# Patient Record
Sex: Male | Born: 1955
Health system: Southern US, Community
[De-identification: ages and names within clinical notes are randomized; demographics above are authoritative.]

## PROBLEM LIST (undated history)

## (undated) DIAGNOSIS — F528 Other sexual dysfunction not due to a substance or known physiological condition: Secondary | ICD-10-CM

## (undated) DIAGNOSIS — J209 Acute bronchitis, unspecified: Secondary | ICD-10-CM

## (undated) DIAGNOSIS — E669 Obesity, unspecified: Secondary | ICD-10-CM

## (undated) DIAGNOSIS — E119 Type 2 diabetes mellitus without complications: Secondary | ICD-10-CM

## (undated) DIAGNOSIS — K219 Gastro-esophageal reflux disease without esophagitis: Secondary | ICD-10-CM

## (undated) DIAGNOSIS — F411 Generalized anxiety disorder: Secondary | ICD-10-CM

## (undated) DIAGNOSIS — M722 Plantar fascial fibromatosis: Secondary | ICD-10-CM

## (undated) DIAGNOSIS — K921 Melena: Secondary | ICD-10-CM

## (undated) DIAGNOSIS — Z550 Illiteracy and low-level literacy: Secondary | ICD-10-CM

## (undated) DIAGNOSIS — E785 Hyperlipidemia, unspecified: Secondary | ICD-10-CM

## (undated) DIAGNOSIS — M545 Low back pain: Secondary | ICD-10-CM

## (undated) DIAGNOSIS — I639 Cerebral infarction, unspecified: Secondary | ICD-10-CM

## (undated) DIAGNOSIS — I10 Essential (primary) hypertension: Secondary | ICD-10-CM

## (undated) HISTORY — DX: Gastro-esophageal reflux disease without esophagitis: K21.9

## (undated) HISTORY — DX: Hyperlipidemia, unspecified: E78.5

## (undated) HISTORY — DX: Other sexual dysfunction not due to a substance or known physiological condition: F52.8

## (undated) HISTORY — PX: APPENDECTOMY: SHX54

## (undated) HISTORY — DX: Melena: K92.1

## (undated) HISTORY — DX: Illiteracy and low-level literacy: Z55.0

## (undated) HISTORY — DX: Obesity, unspecified: E66.9

## (undated) HISTORY — DX: Plantar fascial fibromatosis: M72.2

## (undated) HISTORY — DX: Cerebral infarction, unspecified: I63.9

## (undated) HISTORY — DX: Acute bronchitis, unspecified: J20.9

## (undated) HISTORY — DX: Low back pain: M54.5

## (undated) HISTORY — DX: Type 2 diabetes mellitus without complications: E11.9

## (undated) HISTORY — DX: Essential (primary) hypertension: I10

## (undated) HISTORY — DX: Generalized anxiety disorder: F41.1

---

## 1999-02-01 ENCOUNTER — Emergency Department (HOSPITAL_COMMUNITY): Admission: EM | Admit: 1999-02-01 | Discharge: 1999-02-01 | Payer: Self-pay | Admitting: Emergency Medicine

## 1999-02-01 ENCOUNTER — Encounter: Payer: Self-pay | Admitting: Emergency Medicine

## 2001-07-12 ENCOUNTER — Emergency Department (HOSPITAL_COMMUNITY): Admission: EM | Admit: 2001-07-12 | Discharge: 2001-07-12 | Payer: Self-pay | Admitting: Emergency Medicine

## 2004-03-27 ENCOUNTER — Ambulatory Visit: Payer: Self-pay | Admitting: Internal Medicine

## 2004-05-08 ENCOUNTER — Ambulatory Visit: Payer: Self-pay | Admitting: Internal Medicine

## 2005-01-10 ENCOUNTER — Ambulatory Visit: Payer: Self-pay | Admitting: Internal Medicine

## 2005-02-09 ENCOUNTER — Ambulatory Visit: Payer: Self-pay

## 2005-02-26 ENCOUNTER — Ambulatory Visit: Payer: Self-pay | Admitting: Cardiology

## 2005-05-29 ENCOUNTER — Ambulatory Visit: Payer: Self-pay | Admitting: Cardiology

## 2006-06-19 ENCOUNTER — Ambulatory Visit: Payer: Self-pay | Admitting: Internal Medicine

## 2006-06-19 LAB — CONVERTED CEMR LAB
ALT: 40 units/L (ref 0–40)
AST: 26 units/L (ref 0–37)
Albumin: 4.1 g/dL (ref 3.5–5.2)
Alkaline Phosphatase: 76 units/L (ref 39–117)
BUN: 9 mg/dL (ref 6–23)
Basophils Absolute: 0.1 10*3/uL (ref 0.0–0.1)
Basophils Relative: 0.7 % (ref 0.0–1.0)
Bilirubin Urine: NEGATIVE
Bilirubin, Direct: 0.1 mg/dL (ref 0.0–0.3)
CO2: 29 meq/L (ref 19–32)
Calcium: 9.3 mg/dL (ref 8.4–10.5)
Chloride: 106 meq/L (ref 96–112)
Cholesterol: 210 mg/dL (ref 0–200)
Creatinine, Ser: 0.8 mg/dL (ref 0.4–1.5)
Direct LDL: 126.8 mg/dL
Eosinophils Absolute: 0.1 10*3/uL (ref 0.0–0.6)
Eosinophils Relative: 1.5 % (ref 0.0–5.0)
GFR calc Af Amer: 130 mL/min
GFR calc non Af Amer: 107 mL/min
Glucose, Bld: 278 mg/dL — ABNORMAL HIGH (ref 70–99)
HCT: 47.8 % (ref 39.0–52.0)
HDL: 24.2 mg/dL — ABNORMAL LOW (ref >39.0)
Hemoglobin: 16.2 g/dL (ref 13.0–17.0)
Hgb A1c MFr Bld: 10.9 % — ABNORMAL HIGH (ref 4.6–6.0)
Ketones, ur: NEGATIVE mg/dL
Leukocytes, UA: NEGATIVE
Lymphocytes Relative: 26.9 % (ref 12.0–46.0)
MCHC: 33.9 g/dL (ref 30.0–36.0)
MCV: 83.1 fL (ref 78.0–100.0)
Monocytes Absolute: 0.5 10*3/uL (ref 0.2–0.7)
Monocytes Relative: 7.3 % (ref 3.0–11.0)
Neutro Abs: 4.6 10*3/uL (ref 1.4–7.7)
Neutrophils Relative %: 63.6 % (ref 43.0–77.0)
Nitrite: NEGATIVE
PSA: 0.16 ng/mL
PSA: 0.16 ng/mL (ref 0.10–4.00)
Platelets: 216 10*3/uL (ref 150–400)
Potassium: 4.4 meq/L (ref 3.5–5.1)
RBC: 5.76 M/uL (ref 4.22–5.81)
RDW: 12.5 % (ref 11.5–14.6)
Sodium: 140 meq/L (ref 135–145)
Specific Gravity, Urine: >=1.03 (ref 1.000–1.03)
TSH: 1.73 microintl units/mL (ref 0.35–5.50)
Total Bilirubin: 0.7 mg/dL (ref 0.3–1.2)
Total CHOL/HDL Ratio: 8.7
Total Protein: 7.4 g/dL (ref 6.0–8.3)
Triglycerides: 236 mg/dL (ref 0–149)
Urine Glucose: 500 mg/dL — AB
Urobilinogen, UA: 1 (ref 0.0–1.0)
VLDL: 47 mg/dL — ABNORMAL HIGH (ref 0–40)
WBC: 7.2 10*3/uL (ref 4.5–10.5)
pH: 6 (ref 5.0–8.0)

## 2006-06-25 ENCOUNTER — Ambulatory Visit: Payer: Self-pay | Admitting: Internal Medicine

## 2006-09-20 ENCOUNTER — Ambulatory Visit: Payer: Self-pay | Admitting: Endocrinology

## 2006-09-25 ENCOUNTER — Ambulatory Visit: Payer: Self-pay | Admitting: Internal Medicine

## 2006-09-25 LAB — CONVERTED CEMR LAB
BUN: 10 mg/dL (ref 6–23)
CO2: 27 meq/L (ref 19–32)
Calcium: 9.1 mg/dL (ref 8.4–10.5)
Chloride: 109 meq/L (ref 96–112)
Cholesterol: 179 mg/dL (ref 0–200)
Creatinine, Ser: 0.7 mg/dL (ref 0.4–1.5)
Direct LDL: 141.7 mg/dL
GFR calc Af Amer: 152 mL/min
GFR calc non Af Amer: 125 mL/min
Glucose, Bld: 85 mg/dL (ref 70–99)
HDL: 21.8 mg/dL — ABNORMAL LOW (ref >39.0)
Hgb A1c MFr Bld: 6.4 % — ABNORMAL HIGH (ref 4.6–6.0)
Potassium: 4 meq/L (ref 3.5–5.1)
Sodium: 139 meq/L (ref 135–145)
Total CHOL/HDL Ratio: 8.2
Triglycerides: 215 mg/dL (ref 0–149)
VLDL: 43 mg/dL — ABNORMAL HIGH (ref 0–40)

## 2006-09-28 ENCOUNTER — Encounter: Payer: Self-pay | Admitting: Internal Medicine

## 2006-09-28 DIAGNOSIS — E119 Type 2 diabetes mellitus without complications: Secondary | ICD-10-CM

## 2006-09-28 DIAGNOSIS — M545 Low back pain, unspecified: Secondary | ICD-10-CM | POA: Insufficient documentation

## 2006-09-28 DIAGNOSIS — K219 Gastro-esophageal reflux disease without esophagitis: Secondary | ICD-10-CM | POA: Insufficient documentation

## 2006-09-28 DIAGNOSIS — I10 Essential (primary) hypertension: Secondary | ICD-10-CM | POA: Insufficient documentation

## 2006-09-28 DIAGNOSIS — E785 Hyperlipidemia, unspecified: Secondary | ICD-10-CM

## 2006-09-28 DIAGNOSIS — E669 Obesity, unspecified: Secondary | ICD-10-CM | POA: Insufficient documentation

## 2006-09-28 DIAGNOSIS — F528 Other sexual dysfunction not due to a substance or known physiological condition: Secondary | ICD-10-CM | POA: Insufficient documentation

## 2006-09-28 HISTORY — DX: Obesity, unspecified: E66.9

## 2006-09-28 HISTORY — DX: Low back pain, unspecified: M54.50

## 2006-09-28 HISTORY — DX: Hyperlipidemia, unspecified: E78.5

## 2006-09-28 HISTORY — DX: Essential (primary) hypertension: I10

## 2006-09-28 HISTORY — DX: Type 2 diabetes mellitus without complications: E11.9

## 2006-09-28 HISTORY — DX: Gastro-esophageal reflux disease without esophagitis: K21.9

## 2006-09-28 HISTORY — DX: Other sexual dysfunction not due to a substance or known physiological condition: F52.8

## 2007-07-04 ENCOUNTER — Ambulatory Visit: Payer: Self-pay | Admitting: Internal Medicine

## 2007-07-04 DIAGNOSIS — F411 Generalized anxiety disorder: Secondary | ICD-10-CM | POA: Insufficient documentation

## 2007-07-04 DIAGNOSIS — M722 Plantar fascial fibromatosis: Secondary | ICD-10-CM | POA: Insufficient documentation

## 2007-07-04 HISTORY — DX: Generalized anxiety disorder: F41.1

## 2007-07-04 HISTORY — DX: Plantar fascial fibromatosis: M72.2

## 2007-07-07 LAB — CONVERTED CEMR LAB
ALT: 32 units/L (ref 0–53)
AST: 27 units/L (ref 0–37)
Albumin: 4 g/dL (ref 3.5–5.2)
Alkaline Phosphatase: 54 units/L (ref 39–117)
BUN: 14 mg/dL (ref 6–23)
Basophils Absolute: 0.1 10*3/uL (ref 0.0–0.1)
Basophils Relative: 1.1 % — ABNORMAL HIGH (ref 0.0–1.0)
Bilirubin Urine: NEGATIVE
Bilirubin, Direct: 0.1 mg/dL (ref 0.0–0.3)
CO2: 26 meq/L (ref 19–32)
Calcium: 9.3 mg/dL (ref 8.4–10.5)
Chloride: 108 meq/L (ref 96–112)
Cholesterol: 207 mg/dL (ref 0–200)
Creatinine, Ser: 0.8 mg/dL (ref 0.4–1.5)
Creatinine,U: 125.6 mg/dL
Direct LDL: 161.1 mg/dL
Eosinophils Absolute: 0.1 10*3/uL (ref 0.0–0.7)
Eosinophils Relative: 1.7 % (ref 0.0–5.0)
GFR calc Af Amer: 130 mL/min
GFR calc non Af Amer: 107 mL/min
Glucose, Bld: 126 mg/dL — ABNORMAL HIGH (ref 70–99)
HCT: 45.6 % (ref 39.0–52.0)
HDL: 22.7 mg/dL — ABNORMAL LOW (ref >39.0)
Hemoglobin, Urine: NEGATIVE
Hemoglobin: 15.4 g/dL (ref 13.0–17.0)
Hgb A1c MFr Bld: 7.2 % — ABNORMAL HIGH (ref 4.6–6.0)
Ketones, ur: NEGATIVE mg/dL
Leukocytes, UA: NEGATIVE
Lymphocytes Relative: 30.8 % (ref 12.0–46.0)
MCHC: 33.7 g/dL (ref 30.0–36.0)
MCV: 83.9 fL (ref 78.0–100.0)
Microalb Creat Ratio: 8 mg/g (ref 0.0–30.0)
Microalb, Ur: 1 mg/dL (ref 0.0–1.9)
Monocytes Absolute: 0.6 10*3/uL (ref 0.1–1.0)
Monocytes Relative: 8.4 % (ref 3.0–12.0)
Neutro Abs: 4 10*3/uL (ref 1.4–7.7)
Neutrophils Relative %: 58 % (ref 43.0–77.0)
Nitrite: NEGATIVE
PSA: 0.21 ng/mL (ref 0.10–4.00)
Platelets: 229 10*3/uL (ref 150–400)
Potassium: 4.1 meq/L (ref 3.5–5.1)
RBC: 5.44 M/uL (ref 4.22–5.81)
RDW: 12.7 % (ref 11.5–14.6)
Sodium: 138 meq/L (ref 135–145)
Specific Gravity, Urine: 1.02 (ref 1.000–1.03)
TSH: 2.07 microintl units/mL (ref 0.35–5.50)
Total Bilirubin: 0.8 mg/dL (ref 0.3–1.2)
Total CHOL/HDL Ratio: 9.1
Total Protein, Urine: NEGATIVE mg/dL
Total Protein: 7.3 g/dL (ref 6.0–8.3)
Triglycerides: 119 mg/dL (ref 0–149)
Urine Glucose: NEGATIVE mg/dL
Urobilinogen, UA: 0.2 (ref 0.0–1.0)
VLDL: 24 mg/dL (ref 0–40)
WBC: 6.9 10*3/uL (ref 4.5–10.5)
pH: 5.5 (ref 5.0–8.0)

## 2007-10-30 ENCOUNTER — Encounter (INDEPENDENT_AMBULATORY_CARE_PROVIDER_SITE_OTHER): Payer: Self-pay | Admitting: *Deleted

## 2007-12-19 ENCOUNTER — Ambulatory Visit: Payer: Self-pay | Admitting: Internal Medicine

## 2007-12-19 DIAGNOSIS — J209 Acute bronchitis, unspecified: Secondary | ICD-10-CM | POA: Insufficient documentation

## 2007-12-19 HISTORY — DX: Acute bronchitis, unspecified: J20.9

## 2007-12-23 LAB — CONVERTED CEMR LAB
BUN: 10 mg/dL (ref 6–23)
CO2: 28 meq/L (ref 19–32)
Calcium: 8.8 mg/dL (ref 8.4–10.5)
Chloride: 107 meq/L (ref 96–112)
Cholesterol: 155 mg/dL (ref 0–200)
Creatinine, Ser: 0.8 mg/dL (ref 0.4–1.5)
GFR calc Af Amer: 130 mL/min
GFR calc non Af Amer: 107 mL/min
Glucose, Bld: 115 mg/dL — ABNORMAL HIGH (ref 70–99)
HDL: 20.7 mg/dL — ABNORMAL LOW (ref >39.0)
Hgb A1c MFr Bld: 7.3 % — ABNORMAL HIGH (ref 4.6–6.0)
LDL Cholesterol: 119 mg/dL — ABNORMAL HIGH (ref 0–99)
Potassium: 4.1 meq/L (ref 3.5–5.1)
Sodium: 140 meq/L (ref 135–145)
Total CHOL/HDL Ratio: 7.5
Triglycerides: 76 mg/dL (ref 0–149)
VLDL: 15 mg/dL (ref 0–40)

## 2008-10-04 ENCOUNTER — Telehealth: Payer: Self-pay | Admitting: Internal Medicine

## 2008-11-09 ENCOUNTER — Telehealth: Payer: Self-pay | Admitting: Internal Medicine

## 2008-11-12 ENCOUNTER — Ambulatory Visit: Payer: Self-pay | Admitting: Internal Medicine

## 2008-11-16 LAB — CONVERTED CEMR LAB
ALT: 29 units/L (ref 0–53)
AST: 30 units/L (ref 0–37)
Albumin: 4.2 g/dL (ref 3.5–5.2)
Alkaline Phosphatase: 48 units/L (ref 39–117)
BUN: 14 mg/dL (ref 6–23)
Basophils Absolute: 0 10*3/uL (ref 0.0–0.1)
Basophils Relative: 0.8 % (ref 0.0–3.0)
Bilirubin Urine: NEGATIVE
Bilirubin, Direct: 0.1 mg/dL (ref 0.0–0.3)
Calcium: 9.1 mg/dL (ref 8.4–10.5)
Chloride: 104 meq/L (ref 96–112)
Cholesterol: 187 mg/dL (ref 0–200)
Creatinine, Ser: 0.9 mg/dL (ref 0.4–1.5)
Creatinine,U: 140.1 mg/dL
Eosinophils Absolute: 0.1 10*3/uL (ref 0.0–0.7)
Eosinophils Relative: 1.4 % (ref 0.0–5.0)
GFR calc non Af Amer: 92.96 mL/min (ref >60)
Glucose, Bld: 250 mg/dL — ABNORMAL HIGH (ref 70–99)
HCT: 43.9 % (ref 39.0–52.0)
HDL: 26.1 mg/dL — ABNORMAL LOW (ref >39.00)
Hemoglobin, Urine: NEGATIVE
Hemoglobin: 15.4 g/dL (ref 13.0–17.0)
Hgb A1c MFr Bld: 6.2 % (ref 4.6–6.5)
Ketones, ur: NEGATIVE mg/dL
LDL Cholesterol: 141 mg/dL — ABNORMAL HIGH (ref 0–99)
Leukocytes, UA: NEGATIVE
Lymphocytes Relative: 27 % (ref 12.0–46.0)
Lymphs Abs: 1.6 10*3/uL (ref 0.7–4.0)
MCHC: 35 g/dL (ref 30.0–36.0)
MCV: 86.2 fL (ref 78.0–100.0)
Microalb Creat Ratio: 7.1 mg/g (ref 0.0–30.0)
Microalb, Ur: 1 mg/dL (ref 0.0–1.9)
Monocytes Absolute: 0.4 10*3/uL (ref 0.1–1.0)
Monocytes Relative: 5.8 % (ref 3.0–12.0)
Neutro Abs: 4 10*3/uL (ref 1.4–7.7)
Neutrophils Relative %: 65 % (ref 43.0–77.0)
Nitrite: NEGATIVE
PSA: 0.35 ng/mL (ref 0.10–4.00)
Platelets: 190 10*3/uL (ref 150.0–400.0)
Potassium: 4.4 meq/L (ref 3.5–5.1)
RBC: 5.09 M/uL (ref 4.22–5.81)
RDW: 13.2 % (ref 11.5–14.6)
Sodium: 136 meq/L (ref 135–145)
Specific Gravity, Urine: 1.015 (ref 1.000–1.030)
TSH: 1.35 microintl units/mL (ref 0.35–5.50)
Total Bilirubin: 0.7 mg/dL (ref 0.3–1.2)
Total CHOL/HDL Ratio: 7
Total Protein, Urine: NEGATIVE mg/dL
Total Protein: 6.6 g/dL (ref 6.0–8.3)
Triglycerides: 99 mg/dL (ref 0.0–149.0)
Urine Glucose: 500 mg/dL
Urobilinogen, UA: 1 (ref 0.0–1.0)
VLDL: 19.8 mg/dL (ref 0.0–40.0)
WBC: 6.1 10*3/uL (ref 4.5–10.5)
pH: 6.5 (ref 5.0–8.0)

## 2009-05-23 ENCOUNTER — Ambulatory Visit: Payer: Self-pay | Admitting: Internal Medicine

## 2009-05-23 LAB — CONVERTED CEMR LAB
BUN: 9 mg/dL (ref 6–23)
CO2: 28 meq/L (ref 19–32)
Calcium: 9.3 mg/dL (ref 8.4–10.5)
Chloride: 106 meq/L (ref 96–112)
Cholesterol, target level: 200 mg/dL
Cholesterol: 192 mg/dL (ref 0–200)
Creatinine, Ser: 0.8 mg/dL (ref 0.4–1.5)
GFR calc non Af Amer: 106.29 mL/min (ref >60)
Glucose, Bld: 131 mg/dL — ABNORMAL HIGH (ref 70–99)
HDL goal, serum: 40 mg/dL
HDL: 32.6 mg/dL — ABNORMAL LOW (ref >39.00)
Hgb A1c MFr Bld: 6.6 % — ABNORMAL HIGH (ref 4.6–6.5)
LDL Cholesterol: 126 mg/dL — ABNORMAL HIGH (ref 0–99)
LDL Goal: 100 mg/dL
Potassium: 4.3 meq/L (ref 3.5–5.1)
Sodium: 139 meq/L (ref 135–145)
Total CHOL/HDL Ratio: 6
Triglycerides: 167 mg/dL — ABNORMAL HIGH (ref 0.0–149.0)
VLDL: 33.4 mg/dL (ref 0.0–40.0)

## 2009-10-24 ENCOUNTER — Telehealth: Payer: Self-pay | Admitting: Internal Medicine

## 2009-11-16 ENCOUNTER — Ambulatory Visit: Payer: Self-pay | Admitting: Internal Medicine

## 2009-11-17 LAB — CONVERTED CEMR LAB
ALT: 27 units/L (ref 0–53)
AST: 24 units/L (ref 0–37)
Albumin: 4.1 g/dL (ref 3.5–5.2)
Alkaline Phosphatase: 58 units/L (ref 39–117)
BUN: 15 mg/dL (ref 6–23)
Basophils Absolute: 0.1 10*3/uL (ref 0.0–0.1)
Basophils Relative: 0.9 % (ref 0.0–3.0)
Bilirubin Urine: NEGATIVE
Bilirubin, Direct: 0.1 mg/dL (ref 0.0–0.3)
CO2: 28 meq/L (ref 19–32)
Calcium: 9.3 mg/dL (ref 8.4–10.5)
Chloride: 104 meq/L (ref 96–112)
Cholesterol: 204 mg/dL — ABNORMAL HIGH (ref 0–200)
Creatinine, Ser: 0.9 mg/dL (ref 0.4–1.5)
Creatinine,U: 67.8 mg/dL
Direct LDL: 149.1 mg/dL
Eosinophils Absolute: 0.1 10*3/uL (ref 0.0–0.7)
Eosinophils Relative: 1.8 % (ref 0.0–5.0)
GFR calc non Af Amer: 98.93 mL/min (ref >60)
Glucose, Bld: 98 mg/dL (ref 70–99)
HCT: 41.3 % (ref 39.0–52.0)
HDL: 29.7 mg/dL — ABNORMAL LOW (ref >39.00)
Hemoglobin, Urine: NEGATIVE
Hemoglobin: 14.5 g/dL (ref 13.0–17.0)
Hgb A1c MFr Bld: 7.1 % — ABNORMAL HIGH (ref 4.6–6.5)
Ketones, ur: NEGATIVE mg/dL
Lymphocytes Relative: 30.9 % (ref 12.0–46.0)
Lymphs Abs: 2.4 10*3/uL (ref 0.7–4.0)
MCHC: 35 g/dL (ref 30.0–36.0)
MCV: 83.3 fL (ref 78.0–100.0)
Microalb Creat Ratio: 0.9 mg/g (ref 0.0–30.0)
Microalb, Ur: 0.6 mg/dL (ref 0.0–1.9)
Monocytes Absolute: 0.6 10*3/uL (ref 0.1–1.0)
Monocytes Relative: 8.1 % (ref 3.0–12.0)
Neutro Abs: 4.5 10*3/uL (ref 1.4–7.7)
Neutrophils Relative %: 58.3 % (ref 43.0–77.0)
Nitrite: NEGATIVE
PSA: 0.24 ng/mL (ref 0.10–4.00)
Platelets: 225 10*3/uL (ref 150.0–400.0)
Potassium: 4 meq/L (ref 3.5–5.1)
RBC: 4.96 M/uL (ref 4.22–5.81)
RDW: 14.1 % (ref 11.5–14.6)
Sodium: 138 meq/L (ref 135–145)
Specific Gravity, Urine: 1.015 (ref 1.000–1.030)
TSH: 1.96 microintl units/mL (ref 0.35–5.50)
Total Bilirubin: 0.5 mg/dL (ref 0.3–1.2)
Total CHOL/HDL Ratio: 7
Total Protein, Urine: NEGATIVE mg/dL
Total Protein: 7.1 g/dL (ref 6.0–8.3)
Triglycerides: 244 mg/dL — ABNORMAL HIGH (ref 0.0–149.0)
Urine Glucose: NEGATIVE mg/dL
Urobilinogen, UA: 1 (ref 0.0–1.0)
VLDL: 48.8 mg/dL — ABNORMAL HIGH (ref 0.0–40.0)
WBC: 7.6 10*3/uL (ref 4.5–10.5)
pH: 6 (ref 5.0–8.0)

## 2009-11-18 ENCOUNTER — Ambulatory Visit: Payer: Self-pay | Admitting: Internal Medicine

## 2009-11-20 ENCOUNTER — Encounter: Payer: Self-pay | Admitting: Internal Medicine

## 2010-03-07 NOTE — Assessment & Plan Note (Signed)
Summary: 6 mos f/u #?cd   Vital Signs:  Patient profile:   55 year old male Height:      70.5 inches Weight:      237.50 pounds BMI:     33.72 O2 Sat:      94 % on Room air Temp:     98 degrees F oral Pulse rate:   80 / minute BP sitting:   130 / 78  (left arm) Cuff size:   large  Vitals Entered By: Zella Ball Ewing CMA Duncan Dull) (November 18, 2009 3:34 PM)  O2 Flow:  Room air  Preventive Care Screening     declines colonoscopy  CC: 6 month followup/RE   CC:  6 month followup/RE.  History of Present Illness: here for wellness and f/u- overall doing well,  Pt denies CP, worsening sob, doe, wheezing, orthopnea, pnd, worsening LE edema, palps, dizziness or syncope  Pt denies new neuro symptoms such as headache, facial or extremity weakness  Pt denies polydipsia, polyuria, or low sugar symptoms such as shakiness improved with eating.  Overall fair compliance with meds only - misses his sat AM meds most wks, "mostly" trying to follow low chol, DM diet, wt down - lost 7 lbs with better diet, little excercise however Currently not taking the statin - seemed confused whether he was supposed to take this    Problems Prior to Update: 1)  Asthmatic Bronchitis, Acute  (ICD-466.0) 2)  Anxiety  (ICD-300.00) 3)  Plantar Fasciitis, Bilateral  (ICD-728.71) 4)  Preventive Health Care  (ICD-V70.0) 5)  Obesity  (ICD-278.00) 6)  Erectile Dysfunction  (ICD-302.72) 7)  Low Back Pain  (ICD-724.2) 8)  Gerd  (ICD-530.81) 9)  Hypertension  (ICD-401.9) 10)  Hyperlipidemia  (ICD-272.4) 11)  Diabetes Mellitus, Type II  (ICD-250.00)  Medications Prior to Update: 1)  Cialis 20 Mg Tabs (Tadalafil) .Marland Kitchen.. 1po Once Daily As Needed 2)  Lotrisone 1-0.05 % Crea (Clotrimazole-Betamethasone) .... Apply To Skin Twice A Day 3)  Glucophage 500 Mg  Tabs (Metformin Hcl) .... 2 By Mouth in The Am, and 1 By Mouth in The Pm 4)  Glimepiride 4 Mg  Tabs (Glimepiride) .Marland Kitchen.. 1 By Mouth Two Times A Day 5)  Adult Aspirin Ec Low  Strength 81 Mg  Tbec (Aspirin) .Marland Kitchen.. 1 By Mouth Once Daily 6)  Diclofenac Sodium 75 Mg  Tbec (Diclofenac Sodium) .Marland Kitchen.. 1po Two Times A Day As Needed Pain 7)  Simvastatin 80 Mg Tabs (Simvastatin) .Marland Kitchen.. 1 By Mouth Once Daily  Current Medications (verified): 1)  Cialis 20 Mg Tabs (Tadalafil) .Marland Kitchen.. 1po Once Daily As Needed 2)  Lotrisone 1-0.05 % Crea (Clotrimazole-Betamethasone) .... Apply To Skin Twice A Day 3)  Glucophage 500 Mg  Tabs (Metformin Hcl) .... 2 By Mouth in The Am, and 1 By Mouth in The Pm 4)  Glimepiride 4 Mg  Tabs (Glimepiride) .Marland Kitchen.. 1 By Mouth Two Times A Day 5)  Adult Aspirin Ec Low Strength 81 Mg  Tbec (Aspirin) .Marland Kitchen.. 1 By Mouth Once Daily 6)  Diclofenac Sodium 75 Mg  Tbec (Diclofenac Sodium) .Marland Kitchen.. 1po Two Times A Day As Needed Pain 7)  Simvastatin 40 Mg Tabs (Simvastatin) .Marland Kitchen.. 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         all otherwise negative per pt -    Physical Exam  General:  alert and overweight-appearing.   Head:  normocephalic and atraumatic.   Eyes:  vision grossly intact, pupils equal, and pupils round.   Ears:  R ear normal and L ear normal.   Nose:  no external deformity and no nasal discharge.   Mouth:  no gingival abnormalities and pharynx pink and moist.   Neck:  supple and no masses.   Lungs:  normal respiratory effort and normal breath sounds.   Heart:  normal rate and regular rhythm.   Abdomen:  soft, non-tender, and normal bowel sounds.   Msk:  no joint tenderness and no joint swelling.   Extremities:  no edema, no erythema  Neurologic:  cranial nerves II-XII intact and strength normal in all extremities.  sensation intact to light touch and gait normal.   Skin:  no rashes.  color normal.   Psych:  not depressed appearing and slightly anxious.     Impression & Recommendations:  Problem # 1:  Preventive Health Care (ICD-V70.0) Overall doing well, age appropriate education and counseling updated, referral for preventive services and immunizations addressed, dietary counseling and smoking status adressed , most recent labs reviewed with pt, ecg declined per pt  I have personally reviewed and noted 1.   The patient's medical and social history 2.   Their use of alcohol, tobacco or illicit drugs 3.   Their current medications and supplements 4.   The patient's functional ability including ADL's, fall risks, home safety risks and hearing or visual             impairment. 5.   Diet and physical activities 6.   Evidence for depression or mood disorders The patients weight, height, BMI  have been recorded in the chart I have made referrals, counseling and provided education to the patient based review of the above   Problem # 2:  HYPERTENSION (ICD-401.9)  BP today: 130/78 Prior BP: 130/82 (05/23/2009)  Prior 10 Yr Risk Heart  Disease: 27 % (05/23/2009)  Labs Reviewed: K+: 4.0 (11/16/2009) Creat: : 0.9 (11/16/2009)   Chol: 204 (11/16/2009)   HDL: 29.70 (11/16/2009)   LDL: 126 (05/23/2009)   TG: 244.0 (11/16/2009) stable overall by hx and exam, ok to continue meds/tx as is - to cont wt control efforts  Problem # 3:  DIABETES MELLITUS, TYPE II (ICD-250.00)  His updated medication list for this problem includes:    Glucophage 500 Mg Tabs (Metformin hcl) .Marland Kitchen... 2 by mouth in the am, and 1 by mouth in the pm    Glimepiride 4 Mg Tabs (Glimepiride) .Marland Kitchen... 1 by mouth two times a day    Adult Aspirin Ec Low Strength 81 Mg Tbec (Aspirin) .Marland Kitchen... 1 by mouth once daily  Labs Reviewed: Creat: 0.9 (11/16/2009)    Reviewed HgBA1c results: 7.1 (11/16/2009)  6.6 (05/23/2009) stable  overall by hx and exam, ok to continue meds/tx as is , encourage improved med compliance  Problem # 4:  HYPERLIPIDEMIA (ICD-272.4)  His updated medication list for this problem includes:    Simvastatin 40 Mg Tabs (Simvastatin) .Marland Kitchen... 1po once daily  Labs Reviewed: SGOT: 24 (11/16/2009)   SGPT: 27 (11/16/2009)  Lipid Goals: Chol Goal: 200 (05/23/2009)   HDL Goal: 40 (05/23/2009)   LDL Goal: 100 (05/23/2009)   TG Goal: 150 (05/23/2009)  Prior 10 Yr Risk Heart Disease: 27 % (05/23/2009)   HDL:29.70 (11/16/2009), 32.60 (05/23/2009)  LDL:126 (05/23/2009), 141 (11/12/2008)  Chol:204 (11/16/2009), 192 (05/23/2009)  Trig:244.0 (11/16/2009), 167.0 (05/23/2009) to re-start at 40 mg - to take all new medications as prescribed . Pt to continue diet efforts, - goal LDL less than 70   Complete Medication List: 1)  Cialis 20 Mg Tabs (Tadalafil) .Marland Kitchen.. 1po once daily as needed 2)  Lotrisone 1-0.05 % Crea (Clotrimazole-betamethasone) .... Apply to skin twice a day 3)  Glucophage 500 Mg Tabs (Metformin hcl) .... 2 by mouth in the am, and 1 by mouth in the pm 4)  Glimepiride 4 Mg Tabs (Glimepiride) .Marland Kitchen.. 1 by mouth two times a day 5)  Adult Aspirin Ec Low Strength  81 Mg Tbec (Aspirin) .Marland Kitchen.. 1 by mouth once daily 6)  Diclofenac Sodium 75 Mg Tbec (Diclofenac sodium) .Marland Kitchen.. 1po two times a day as needed pain 7)  Simvastatin 40 Mg Tabs (Simvastatin) .Marland Kitchen.. 1po once daily  Patient Instructions: 1)  Please take all new medications as prescribed  - the cholesterol pill 2)  Continue all previous medications as before this visit 3)  Please schedule a follow-up appointment in 6 months with: 4)  BMP prior to visit, ICD-9: 250.02 5)  Lipid Panel prior to visit, ICD-9: 6)  HbgA1C prior to visit, ICD-9: Prescriptions: SIMVASTATIN 40 MG TABS (SIMVASTATIN) 1po once daily  #90 x 3   Entered and Authorized by:   Corwin Levins MD   Signed by:   Corwin Levins MD on 11/18/2009   Method used:   Electronically to        [facility] [phone]* (retail)       [address]       [place], [place]  [address]       Ph: [phone]       Fax: [phone]   RxID:   [id number] LOTRISONE 1-0.05 % CREA (CLOTRIMAZOLE-BETAMETHASONE) Apply to skin twice a day  #1 x 1   Entered and Authorized by:   Corwin Levins MD   Signed by:   Corwin Levins MD on 11/18/2009   Method used:   Electronically to        [facility] [phone]* (retail)       [address]       [place], [place]  [address]       Ph: [phone]       Fax: [phone]   RxID:   [id number]

## 2010-03-07 NOTE — Miscellaneous (Signed)
Summary: Orders Update   Clinical Lists Changes  Orders: Added new Service order of Est. Patient 40-64 years (99396) - Signed 

## 2010-03-07 NOTE — Assessment & Plan Note (Signed)
Summary: 6 mos f/u #/cd   Vital Signs:  Patient profile:   55 year old male Height:      70 inches Weight:      246.50 pounds BMI:     35.50 O2 Sat:      94 % on Room air Temp:     97.2 degrees F oral Pulse rate:   75 / minute BP sitting:   130 / 82  (left arm) Cuff size:   large  Vitals Entered ByZella Ball Ewing (May 23, 2009 9:46 AM)  O2 Flow:  Room air  Preventive Care Screening     declines pneumonia   CC: 6 Month Followup/RE, Lipid Management   CC:  6 Month Followup/RE and Lipid Management.  History of Present Illness: overall doing well,  Pt denies CP, sob, doe, wheezing, orthopnea, pnd, worsening LE edema, palps, dizziness or syncope  Pt denies new neuro symptoms such as headache, facial or extremity weakness  Pt denies polydipsia, polyuria, or low sugar symptoms such as shakiness improved with eating.  Overall good compliance with meds, trying to follow low chol, DM diet, wt stable, little excercise however  CBGs run in the lower 100's, only forgets meds about 1 time per wk.    Lipid Management History:      Positive NCEP/ATP III risk factors include male age 75 years old or older, diabetes, HDL cholesterol less than 40, and hypertension.  Negative NCEP/ATP III risk factors include non-tobacco-user status.    Preventive Screening-Counseling & Management      Drug Use:  no.    Problems Prior to Update: 1)  Asthmatic Bronchitis, Acute  (ICD-466.0) 2)  Anxiety  (ICD-300.00) 3)  Plantar Fasciitis, Bilateral  (ICD-728.71) 4)  Preventive Health Care  (ICD-V70.0) 5)  Obesity  (ICD-278.00) 6)  Erectile Dysfunction  (ICD-302.72) 7)  Low Back Pain  (ICD-724.2) 8)  Gerd  (ICD-530.81) 9)  Hypertension  (ICD-401.9) 10)  Hyperlipidemia  (ICD-272.4) 11)  Diabetes Mellitus, Type II  (ICD-250.00)  Medications Prior to Update: 1)  Cialis 20 Mg Tabs (Tadalafil) .Marland Kitchen.. 1po Once Daily As Needed 2)  Lotrisone 1-0.05 % Crea (Clotrimazole-Betamethasone) .... Apply To Skin Twice A  Day 3)  Glucophage 500 Mg  Tabs (Metformin Hcl) .... 2 By Mouth in The Am, and 1 By Mouth in The Pm 4)  Glimepiride 4 Mg  Tabs (Glimepiride) .Marland Kitchen.. 1 By Mouth Two Times A Day 5)  Adult Aspirin Ec Low Strength 81 Mg  Tbec (Aspirin) .Marland Kitchen.. 1 By Mouth Once Daily 6)  Diclofenac Sodium 75 Mg  Tbec (Diclofenac Sodium) .Marland Kitchen.. 1po Two Times A Day As Needed Pain 7)  Simvastatin 80 Mg Tabs (Simvastatin) .Marland Kitchen.. 1 By Mouth Once Daily  Current Medications (verified): 1)  Cialis 20 Mg Tabs (Tadalafil) .Marland Kitchen.. 1po Once Daily As Needed 2)  Lotrisone 1-0.05 % Crea (Clotrimazole-Betamethasone) .... Apply To Skin Twice A Day 3)  Glucophage 500 Mg  Tabs (Metformin Hcl) .... 2 By Mouth in The Am, and 1 By Mouth in The Pm 4)  Glimepiride 4 Mg  Tabs (Glimepiride) .Marland Kitchen.. 1 By Mouth Two Times A Day 5)  Adult Aspirin Ec Low Strength 81 Mg  Tbec (Aspirin) .Marland Kitchen.. 1 By Mouth Once Daily 6)  Diclofenac Sodium 75 Mg  Tbec (Diclofenac Sodium) .Marland Kitchen.. 1po Two Times A Day As Needed Pain 7)  Simvastatin 80 Mg Tabs (Simvastatin) .Marland Kitchen.. 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   Head:  normocephalic and atraumatic.   Eyes:  vision grossly intact, pupils equal, and  pupils round.   Ears:  R ear normal and L ear normal.   Nose:  no external deformity and no nasal discharge.   Mouth:  no gingival abnormalities and pharynx pink and moist.   Neck:  supple and no masses.   Lungs:  normal respiratory effort and normal breath sounds.   Heart:  normal rate and regular rhythm.   Extremities:  no edema, no erythema    Impression & Recommendations:  Problem # 1:  HYPERTENSION (ICD-401.9)  BP today: 130/82 Prior BP: 132/84 (11/12/2008)  Labs Reviewed: K+: 4.4 (11/12/2008) Creat: : 0.9 (11/12/2008)   Chol: 187 (11/12/2008)   HDL: 26.10 (11/12/2008)   LDL: 141 (11/12/2008)   TG: 99.0 (11/12/2008) stable overall by hx and exam, ok to continue meds/tx as is  - no meds needed at this time, to which he attributes to regular marijuana use  Problem # 2:  DIABETES MELLITUS, TYPE II (ICD-250.00)  His updated medication list for this problem includes:    Glucophage 500 Mg Tabs (Metformin hcl) .Marland Kitchen... 2 by mouth in the am, and 1 by mouth in the pm    Glimepiride 4 Mg Tabs (Glimepiride) .Marland Kitchen... 1 by mouth two times a day    Adult Aspirin Ec Low Strength 81 Mg Tbec (Aspirin) .Marland Kitchen... 1 by mouth once daily  Labs Reviewed: Creat: 0.9 (11/12/2008)    Reviewed HgBA1c results: 6.2 (11/12/2008)  7.3 (12/19/2007) stable overall by hx and exam, ok to continue meds/tx as is   Orders: TLB-BMP (Basic Metabolic Panel-BMET) (80048-METABOL) TLB-Lipid Panel (80061-LIPID) TLB-A1C / Hgb A1C (Glycohemoglobin) (83036-A1C)  Problem # 3:  HYPERLIPIDEMIA (ICD-272.4)  His updated medication list for this problem includes:    Simvastatin 80 Mg Tabs (Simvastatin) .Marland Kitchen... 1 by mouth once daily  Labs Reviewed: SGOT: 30 (11/12/2008)   SGPT: 29 (11/12/2008)   HDL:26.10 (11/12/2008), 20.7 (12/19/2007)  LDL:141 (11/12/2008), 119 (12/19/2007)  Chol:187 (11/12/2008), 155 (12/19/2007)  Trig:99.0 (11/12/2008), 76 (12/19/2007) stable overall by hx and exam, ok to continue meds/tx as is   Complete  Medication List: 1)  Cialis 20 Mg Tabs (Tadalafil) .Marland Kitchen.. 1po once daily as needed 2)  Lotrisone 1-0.05 % Crea (Clotrimazole-betamethasone) .... Apply to skin twice a day 3)  Glucophage 500 Mg Tabs (Metformin hcl) .... 2 by mouth in the am, and 1 by mouth in the pm 4)  Glimepiride 4 Mg Tabs (Glimepiride) .Marland Kitchen.. 1 by mouth two times a day 5)  Adult Aspirin Ec Low Strength 81 Mg Tbec (Aspirin) .Marland Kitchen.. 1 by mouth once daily 6)  Diclofenac Sodium 75 Mg Tbec (Diclofenac sodium) .Marland Kitchen.. 1po two times a day as needed pain 7)  Simvastatin 80 Mg Tabs (Simvastatin) .Marland Kitchen.. 1 by mouth once daily  Lipid Assessment/Plan:      Based  on NCEP/ATP III, the patient's risk factor category is "history of diabetes".  The patient's lipid goals are as follows: Total cholesterol goal is 200; LDL cholesterol goal is 100; HDL cholesterol goal is 40; Triglyceride goal is 150.     Patient Instructions: 1)  Continue all previous medications as before this visit  2)  Please go to the Lab in the basement for your blood and/or urine tests today 3)  Please schedule a follow-up appointment in 6 months with CPX labs and: 4)  HbgA1C prior to visit, ICD-9: 250.02 5)  Urine Microalbumin prior to visit, ICD-9:

## 2010-03-07 NOTE — Progress Notes (Signed)
  Phone Note Refill Request Message from:  Fax from Pharmacy on October 24, 2009 8:43 AM  Refills Requested: Medication #1:  GLUCOPHAGE 500 MG  TABS 2 by mouth in the AM   Dosage confirmed as above?Dosage Confirmed   Last Refilled: 03/15/2009   Notes: [facility], Initial call taken by: Zella Ball Ewing CMA Duncan Dull),  October 24, 2009 8:43 AM    Prescriptions: GLUCOPHAGE 500 MG  TABS (METFORMIN HCL) 2 by mouth in the AM, and 1 by mouth in the PM  #270 Each x 3   Entered by:   Zella Ball Ewing CMA (AAMA)   Authorized by:   Corwin Levins MD   Signed by:   Scharlene Gloss CMA (AAMA) on 10/24/2009   Method used:   Faxed to ...       [facility]  [address]  [phone]* (retail)       [address]       [place], [place]  [address]       Ph: [phone] or [phone]       Fax: [phone]   RxID:   [id number]

## 2010-05-19 ENCOUNTER — Other Ambulatory Visit: Payer: Self-pay | Admitting: Internal Medicine

## 2010-05-19 ENCOUNTER — Other Ambulatory Visit: Payer: Self-pay

## 2010-05-19 DIAGNOSIS — IMO0001 Reserved for inherently not codable concepts without codable children: Secondary | ICD-10-CM

## 2010-05-26 ENCOUNTER — Ambulatory Visit (INDEPENDENT_AMBULATORY_CARE_PROVIDER_SITE_OTHER): Payer: BC Managed Care – PPO | Admitting: Internal Medicine

## 2010-05-26 ENCOUNTER — Encounter: Payer: Self-pay | Admitting: Internal Medicine

## 2010-05-26 ENCOUNTER — Other Ambulatory Visit (INDEPENDENT_AMBULATORY_CARE_PROVIDER_SITE_OTHER): Payer: BC Managed Care – PPO

## 2010-05-26 VITALS — BP 120/78 | HR 79 | Temp 97.8°F | Ht 70.0 in | Wt 245.0 lb

## 2010-05-26 DIAGNOSIS — I10 Essential (primary) hypertension: Secondary | ICD-10-CM

## 2010-05-26 DIAGNOSIS — F528 Other sexual dysfunction not due to a substance or known physiological condition: Secondary | ICD-10-CM

## 2010-05-26 DIAGNOSIS — E785 Hyperlipidemia, unspecified: Secondary | ICD-10-CM

## 2010-05-26 DIAGNOSIS — E119 Type 2 diabetes mellitus without complications: Secondary | ICD-10-CM

## 2010-05-26 DIAGNOSIS — Z719 Counseling, unspecified: Secondary | ICD-10-CM | POA: Insufficient documentation

## 2010-05-26 DIAGNOSIS — Z0000 Encounter for general adult medical examination without abnormal findings: Secondary | ICD-10-CM

## 2010-05-26 LAB — HEMOGLOBIN A1C: Hgb A1c MFr Bld: 8.9 % — ABNORMAL HIGH (ref 4.6–6.5)

## 2010-05-26 MED ORDER — TADALAFIL 20 MG PO TABS: 20.0000 mg | ORAL_TABLET | Freq: Every day | ORAL | Status: DC | PRN

## 2010-05-26 MED ORDER — LOVASTATIN 40 MG PO TABS: 40.0000 mg | ORAL_TABLET | Freq: Every day | ORAL | Status: DC

## 2010-05-26 NOTE — Patient Instructions (Signed)
Take all new medications as prescribed - the cialis (you are given the coupon today) Start the lovastatin at 40 mg per day Continue all other medications as before Please go to LAB in the Basement for the blood and/or urine tests to be done today Please call the number on the Blue Card (the [employer] System) for results of testing in 2-3 days Please return in 6 mo with Lab testing done 3-5 days before

## 2010-05-26 NOTE — Assessment & Plan Note (Addendum)
Per pt, crestor too expensive, lipitor too risky after heard friends talking about it, also heard bad things about the zocor so never tried it after last visit, but is willing to try the lovastatin 40  - to start, with f/u labs next visit  Lab Results  Component Value Date   LDLCALC 126* 05/23/2009

## 2010-05-27 ENCOUNTER — Encounter: Payer: Self-pay | Admitting: Internal Medicine

## 2010-05-27 MED ORDER — DICLOFENAC SODIUM 75 MG PO TBEC: 75.0000 mg | DELAYED_RELEASE_TABLET | Freq: Two times a day (BID) | ORAL | Status: DC

## 2010-05-27 MED ORDER — METFORMIN HCL 500 MG PO TABS: ORAL_TABLET | ORAL | Status: DC

## 2010-05-27 MED ORDER — GLUCOSE BLOOD VI STRP: ORAL_STRIP | Status: DC

## 2010-05-27 MED ORDER — GLIMEPIRIDE 4 MG PO TABS: 4.0000 mg | ORAL_TABLET | Freq: Two times a day (BID) | ORAL | Status: DC

## 2010-05-27 NOTE — Assessment & Plan Note (Signed)
Ongoing problem, for cialis free coupon for 3 tabs, use prn ,  to f/u any worsening symptoms or concerns

## 2010-05-27 NOTE — Assessment & Plan Note (Signed)
stable overall by hx and exam, most recent lab reviewed with pt, and pt to continue medical treatment as before  Lab Results  Component Value Date   HGBA1C 7.1* 11/16/2009   To check further lab today

## 2010-05-27 NOTE — Assessment & Plan Note (Signed)
stable overall by hx and exam, most recent lab reviewed with pt, and pt to continue medical treatment as before  BP Readings from Last 3 Encounters:  05/26/10 120/78  11/18/09 130/78  05/23/09 130/82

## 2010-05-27 NOTE — Progress Notes (Signed)
Subjective:    Patient ID: John Roman, male    DOB: May 08, 1955, 55 y.o.   MRN: [id number]  HPI  .here to f/u; overall doing ok;  Pt denies chest pain, increased sob or doe, wheezing, orthopnea, PND, increased LE swelling, palpitations, dizziness or syncope.  Pt denies new neurological symptoms such as new headache, or facial or extremity weakness or numbness   Pt denies polydipsia, polyuria, or low sugar symptoms such as weakness or confusion improved with po intake.  Pt states overall good compliance with meds, trying to follow lower cholesterol, diabetic diet, wt overall stable but little exercise however.     Not checking cbg's recently as meter has broken.  Also with ongoing ? Worsening ED symptoms in the past 6 mo, with inability to maintain.  No new complaints.  Past Medical History  Diagnosis Date  . DIABETES MELLITUS, TYPE II 09/28/2006  . HYPERLIPIDEMIA 09/28/2006  . OBESITY 09/28/2006  . ANXIETY 07/04/2007  . ERECTILE DYSFUNCTION 09/28/2006  . HYPERTENSION 09/28/2006  . ASTHMATIC BRONCHITIS, ACUTE 12/19/2007  . GERD 09/28/2006  . LOW BACK PAIN 09/28/2006  . PLANTAR FASCIITIS, BILATERAL 07/04/2007   Past Surgical History  Procedure Date  . Appendectomy     reports that he has quit smoking. He does not have any smokeless tobacco history on file. He reports that he uses illicit drugs. He reports that he does not drink alcohol. family history includes Colon polyps in his sister; Coronary artery disease in his brother; Diabetes in his brother; Heart attack in his brother; and Hypertension in his brother. No Known Allergies Current Outpatient Prescriptions on File Prior to Visit  Medication Sig Dispense Refill  . aspirin 81 MG EC tablet Take 81 mg by mouth daily.        . clotrimazole-betamethasone (LOTRISONE) cream Apply topically 2 (two) times daily.        . tadalafil (CIALIS) 20 MG tablet Take 20 mg by mouth daily as needed.        Marland Kitchen DISCONTD: glimepiride (AMARYL) 4 MG tablet Take 4  mg by mouth 2 (two) times daily.        Marland Kitchen DISCONTD: metFORMIN (GLUCOPHAGE) 500 MG tablet Take 500 mg by mouth. 2 by mouth in the am and 1 by mouth in the pm       . DISCONTD: diclofenac (VOLTAREN) 75 MG EC tablet Take 75 mg by mouth 2 (two) times daily.         Review of Systems Review of Systems  Constitutional: Negative for diaphoresis and unexpected weight change.  HENT: Negative for drooling and tinnitus.   Eyes: Negative for photophobia and visual disturbance.  Respiratory: Negative for choking and stridor.   Gastrointestinal: Negative for vomiting and blood in stool.  Genitourinary: Negative for hematuria and decreased urine volume.  Musculoskeletal: Negative for gait problem.  Skin: Negative for color change and wound.  Neurological: Negative for tremors and numbness.  Psychiatric/Behavioral: Negative for decreased concentration. The patient is not hyperactive.       Objective:   Physical Exam BP 120/78  Pulse 79  Temp(Src) 97.8 F (36.6 C) (Oral)  Ht 5\' 10"  (1.778 m)  Wt 245 lb (111.131 kg)  BMI 35.15 kg/m2  SpO2 95% Physical Exam  VS noted Constitutional: Pt appears well-developed and well-nourished.  HENT: Head: Normocephalic.  Right Ear: External ear normal.  Left Ear: External ear normal.  Eyes: Conjunctivae and EOM are normal. Pupils are equal, round, and reactive to light.  Neck: Normal  range of motion. Neck supple.  Cardiovascular: Normal rate and regular rhythm.   Pulmonary/Chest: Effort normal and breath sounds normal.  Abd:  Soft, NT, non-distended, + BS Neurological: Pt is alert. No cranial nerve deficit.  Skin: Skin is warm. No erythema.  Psychiatric: Pt behavior is normal. Thought content normal.         Assessment & Plan:

## 2010-05-29 ENCOUNTER — Encounter: Payer: Self-pay | Admitting: Internal Medicine

## 2010-05-29 ENCOUNTER — Telehealth: Payer: Self-pay

## 2010-05-29 ENCOUNTER — Other Ambulatory Visit: Payer: BC Managed Care – PPO

## 2010-05-29 ENCOUNTER — Other Ambulatory Visit (INDEPENDENT_AMBULATORY_CARE_PROVIDER_SITE_OTHER): Payer: BC Managed Care – PPO

## 2010-05-29 ENCOUNTER — Other Ambulatory Visit: Payer: Self-pay | Admitting: Internal Medicine

## 2010-05-29 DIAGNOSIS — E119 Type 2 diabetes mellitus without complications: Secondary | ICD-10-CM

## 2010-05-29 DIAGNOSIS — Z0000 Encounter for general adult medical examination without abnormal findings: Secondary | ICD-10-CM

## 2010-05-29 DIAGNOSIS — K921 Melena: Secondary | ICD-10-CM

## 2010-05-29 HISTORY — DX: Melena: K92.1

## 2010-05-29 LAB — BASIC METABOLIC PANEL
BUN: 15 mg/dL (ref 6–23)
CO2: 24 mEq/L (ref 19–32)
Calcium: 9.3 mg/dL (ref 8.4–10.5)
Chloride: 104 mEq/L (ref 96–112)
Creatinine, Ser: 0.8 mg/dL (ref 0.4–1.5)

## 2010-05-29 LAB — URINALYSIS, ROUTINE W REFLEX MICROSCOPIC
Ketones, ur: NEGATIVE
Specific Gravity, Urine: 1.01 (ref 1.000–1.030)
Total Protein, Urine: NEGATIVE
Urine Glucose: NEGATIVE
Urobilinogen, UA: 1 (ref 0.0–1.0)

## 2010-05-29 LAB — LIPID PANEL
LDL Cholesterol: 120 mg/dL — ABNORMAL HIGH (ref 0–99)
Total CHOL/HDL Ratio: 6

## 2010-05-29 LAB — MICROALBUMIN / CREATININE URINE RATIO: Creatinine,U: 59.1 mg/dL

## 2010-05-29 MED ORDER — TADALAFIL 5 MG PO TABS: 5.0000 mg | ORAL_TABLET | Freq: Every day | ORAL | Status: DC | PRN

## 2010-05-29 MED ORDER — METFORMIN HCL 500 MG PO TABS: 1000.0000 mg | ORAL_TABLET | Freq: Two times a day (BID) | ORAL | Status: DC

## 2010-05-29 MED ORDER — METFORMIN HCL 500 MG PO TABS: ORAL_TABLET | ORAL | Status: DC

## 2010-05-29 NOTE — Telephone Encounter (Signed)
The coupon was for 5 mg tab for 30 days, or for 3 of the 20 mg tabs  He received the 3 x20 mg tab rx  No further rx needed  Pt should let me know if this works  Pt should realize the 5 mg daily cialis is very expensive to pay out of pocket and I think he should try the 20 mg pills

## 2010-05-29 NOTE — Telephone Encounter (Signed)
[facility] Triage Call Report Triage Record Num: [id number] Operator: Caswell Corwin Patient Name: John Roman Call Date & Time: 05/27/2010 11:49:05AM Patient Phone: [phone] PCP: Patient Gender: Male PCP Fax : Patient DOB: 07/14/1955 [facility] Name: [facility] Reason for Call: Pt calling that he saw Dr. Oliver Barre 05/26/10 and is supposed to have a free trial of Cialis x 30 days and RX only written x 3 days. Inst will need to call ofc 05/29/10 for full refill approval. Protocol(s) Used: Medication Question Calls, No Triage (Adults) Protocol(s) Used: PCP Calls, No Triage (Adult) Recommended Outcome per Protocol: Call Provider within 24 Hours Reason for Outcome: Questions concerning medications Caller requesting a non urgent new prescription or refill and triager unable to refill per unit policy Care Advice: ~ 05/27/2010 11:55:51AM Page 1 of 1 [email or web address]

## 2010-05-29 NOTE — Telephone Encounter (Signed)
Addended by: Oliver Barre on: 05/29/2010 03:46 PM   Modules accepted: Orders

## 2010-05-29 NOTE — Telephone Encounter (Signed)
#  30 if the 5 mg were sent to pharmacy

## 2010-06-23 NOTE — Procedures (Signed)
[facility]  Patient:    John Roman                        MRN: [id number] Proc. Date: 02/01/99 Adm. Date:  [id number] Attending:  Annamarie Dawley                           Procedure Report  DESCRIPTION OF PROCEDURE:  The patient was brought to the procedure area and given a thumb block by the emergency room department.  The patient had a thorough prep and drape consisting of 10-minute surgical Betadine scrub, with two scrub brushes, followed by sterile irrigation.  Once this was done, the thumb was secured with  sterile drapes and an I&D was performed of the bone scan and subcutaneous tissue. Excisional debridement was carried out.  A large amount of devitalized tissue and metal shavings were removed.  The patient had the bone prepared distally with excision, using the 15 blade knife.  There was some remaining nailbed distally which was excised with a sharp knifeblade.  This was done to the surgeons satisfaction.  Following this, a tourniquet was placed and the patient then underwent further irrigation.  Following this, the patient underwent an advancement flap with local tissues.  A formal Moberg-type flap was not needed secondary to  some skin redundancy.  The skin was defatted at the tip and sculpted appropriately with 15 blade and a 4.0 loop magnification to the surgeons satisfaction.  I was  happy with the fit of the skin flap.  It was slightly defatted distally.  The bony end was prepared with rongeur and smoothed to a nice rounded edge without sharp  edges.  The patient then had preparation of the nailbed.  The sterile matrix was prepared appropriately.  Following this, the skin was irrigated one last time.  Following this the patient had approximation of the skin tissues with 4-0 Prolene. The nailbed was repaired to the volar portion of skin with 4-0 chromic suture.  Following this, tourniquet was deflated.  Refill was noted  to be excellent to the volar flap.  A piece of Adaptic was placed under the epicanthal fold and the patient then had Xeroform followed by fluff gauze Kling and a splint placed.  The patient tolerated this difficulty.  He was allowed to view the thumb at the  conclusion of the procedure.  I was happy with the thumb appearance and tissue rearrangement.  The patient will return to see me in 7-10 days.  He will notify me if he has any problems or if questions arise.  I have given him Keflex, as well as p.o. pain medicine.  All questions were encouraged and answered. DD:  02/02/99 TD:  02/02/99 Job: [id number]

## 2010-06-23 NOTE — Consult Note (Signed)
[facility]  Patient:    John Roman                        MRN: [id number] Proc. Date: 02/01/99 Adm. Date:  [id number] Attending:  Annamarie Dawley                          Consultation Report  HISTORY:  I had the pleasure to see Elex Mainwaring today in the emergency room.  This patient is a 55 year old white male who sustained an on-the-job injury, February 01, 1999.  The patient had a steel plate lacerate his left thumb.  He sustained a partial amputation of the left thumb.  There was significant contamination with metal shavings and severely torn tissue noted.  The patient as seen and evaluated by the emergency room staff and subsequently referred for further care.  His tetanus shot is up to date.  He denies other injury.  PAST MEDICAL HISTORY:  None.  PAST SURGICAL HISTORY:  Left small finger.  MEDICINES:  None.  ALLERGIES:  None.  SOCIAL HISTORY:  The patient does not smoke or drink.  He works at [employer].  He has a son and is married.  PHYSICAL EXAMINATION:  GENERAL:  White male, alert and oriented, in no acute distress.  HEENT:  Normal.  LUNGS:  Clear.  HEART:  Regular rate.  ABDOMEN:  Soft and nontender, nondistended.  EXTREMITIES:  Bilateral lower extremities are atraumatic and neurovascularly intact.  The left shoulder and elbow are nontender.  Forearm and wrist are nontender.  The left thumb has a jagged distal tip amputation, with foreign body consistent with metal shavings.  There is exposed distal phalanx and obvious nailbed injury.  FPL and ETL are intact.  The index through small fingers are atraumatic.  DIAGNOSTIC STUDIES:  X-rays show a distal tip amputation at the distal phalanx level away from the interphalangeal joint.  IMPRESSION:  Tip amputation, left thumb.  PLAN:  I verbally consented him for I&D and repair of structure as necessary. e will proceed directly with this.   All questions were encouraged and answered. DD:  02/02/99 TD:  02/02/99 Job: [id number]

## 2010-10-05 ENCOUNTER — Ambulatory Visit (INDEPENDENT_AMBULATORY_CARE_PROVIDER_SITE_OTHER): Payer: BC Managed Care – PPO | Admitting: Internal Medicine

## 2010-10-05 ENCOUNTER — Encounter: Payer: Self-pay | Admitting: Internal Medicine

## 2010-10-05 VITALS — BP 118/70 | HR 72 | Temp 97.5°F | Ht 70.0 in | Wt 247.0 lb

## 2010-10-05 DIAGNOSIS — I10 Essential (primary) hypertension: Secondary | ICD-10-CM

## 2010-10-05 DIAGNOSIS — E119 Type 2 diabetes mellitus without complications: Secondary | ICD-10-CM

## 2010-10-05 DIAGNOSIS — F411 Generalized anxiety disorder: Secondary | ICD-10-CM

## 2010-10-05 DIAGNOSIS — L02419 Cutaneous abscess of limb, unspecified: Secondary | ICD-10-CM | POA: Insufficient documentation

## 2010-10-05 MED ORDER — SULFAMETHOXAZOLE-TRIMETHOPRIM 800-160 MG PO TABS: 1.0000 | ORAL_TABLET | Freq: Two times a day (BID) | ORAL | Status: AC

## 2010-10-05 NOTE — Assessment & Plan Note (Signed)
stable overall by hx and exam, most recent data reviewed with pt, and pt to continue medical treatment as before   BP Readings from Last 3 Encounters:  10/05/10 118/70  05/26/10 120/78  11/18/09 130/78

## 2010-10-05 NOTE — Progress Notes (Signed)
Subjective:    Patient ID: John Roman, male    DOB: [birth year], 55 y.o.   MRN: [id number]  HPI   Here with 3 days acute onset fever, post left thigh pain, pressure, general weakness and malaise,  Mild drainage yesterday (not today), and no chills.  Pt denies chest pain, increased sob or doe, wheezing, orthopnea, PND, increased LE swelling, palpitations, dizziness or syncope.  Pt denies new neurological symptoms such as new headache, or facial or extremity weakness or numbness   Pt denies polydipsia, polyuria, or low sugar symptoms such as weakness or confusion improved with po intake.  Pt states overall good compliance with meds, trying to follow lower cholesterol, diabetic diet, wt overall stable but little exercise however.    Denies worsening depressive symptoms, suicidal ideation, or panic, though has ongoing anxiety, not increased recently.  Past Medical History  Diagnosis Date  . DIABETES MELLITUS, TYPE II 09/28/2006  . HYPERLIPIDEMIA 09/28/2006  . OBESITY 09/28/2006  . ANXIETY 07/04/2007  . ERECTILE DYSFUNCTION 09/28/2006  . HYPERTENSION 09/28/2006  . ASTHMATIC BRONCHITIS, ACUTE 12/19/2007  . GERD 09/28/2006  . LOW BACK PAIN 09/28/2006  . PLANTAR FASCIITIS, BILATERAL 07/04/2007  . Hematochezia 05/29/2010   Past Surgical History  Procedure Date  . Appendectomy     reports that he has quit smoking. He does not have any smokeless tobacco history on file. He reports that he uses illicit drugs. He reports that he does not drink alcohol. family history includes Colon polyps in his sister; Coronary artery disease in his brother; Diabetes in his brother; Heart attack in his brother; and Hypertension in his brother. No Known Allergies Current Outpatient Prescriptions on File Prior to Visit  Medication Sig Dispense Refill  . aspirin 81 MG EC tablet Take 81 mg by mouth daily.        . clotrimazole-betamethasone (LOTRISONE) cream Apply topically 2 (two) times daily.        . diclofenac (VOLTAREN) 75  MG EC tablet Take 1 tablet (75 mg total) by mouth 2 (two) times daily.  180 tablet  3  . glimepiride (AMARYL) 4 MG tablet Take 1 tablet (4 mg total) by mouth 2 (two) times daily.  180 tablet  3  . glucose blood test strip Use as instructed  100 each  12  . lovastatin (MEVACOR) 40 MG tablet Take 1 tablet (40 mg total) by mouth daily.  90 tablet  3  . metFORMIN (GLUCOPHAGE) 500 MG tablet 2 tabs by mouth twice per day(this is corrected rx)  360 tablet  3  . tadalafil (CIALIS) 5 MG tablet Take 1 tablet (5 mg total) by mouth daily as needed.  30 tablet  0    Review of Systems Review of Systems  Constitutional: Negative for diaphoresis and unexpected weight change.  HENT: Negative for drooling and tinnitus.   Eyes: Negative for photophobia and visual disturbance.  Respiratory: Negative for choking and stridor.   Gastrointestinal: Negative for vomiting and blood in stool.  Genitourinary: Negative for hematuria and decreased urine volume.    Objective:   Physical Exam BP 118/70  Pulse 72  Temp(Src) 97.5 F (36.4 C) (Oral)  Ht 5\' 10"  (1.778 m)  Wt 247 lb (112.038 kg)  BMI 35.44 kg/m2  SpO2 97% Physical Exam  VS noted, not ill appearing Constitutional: Pt appears well-developed and well-nourished.  HENT: Head: Normocephalic.  Right Ear: External ear normal.  Left Ear: External ear normal.  Eyes: Conjunctivae and EOM are normal. Pupils are equal,  round, and reactive to light.  Neck: Normal range of motion. Neck supple.  Cardiovascular: Normal rate and regular rhythm.   Pulmonary/Chest: Effort normal and breath sounds normal.  Abd:  Soft, NT, non-distended, + BS Neurological: Pt is alert. No cranial nerve deficit.  Skin: Skin is warm. No erythema. except for post left thigh with 2 cm area induration, red, swelling, tender without red streaks or drainage Psychiatric: Pt behavior is normal. Thought content normal. 1+ nervous       Assessment & Plan:

## 2010-10-05 NOTE — Assessment & Plan Note (Signed)
stable overall by hx and exam, most recent data reviewed with pt, and pt to continue medical treatment as before  Lab Results  Component Value Date   WBC 7.6 11/16/2009   HGB 14.5 11/16/2009   HCT 41.3 11/16/2009   PLT 225.0 11/16/2009   CHOL 182 05/26/2010   TRIG 158.0* 05/26/2010   HDL 30.80* 05/26/2010   LDLDIRECT 149.1 11/16/2009   ALT 27 11/16/2009   AST 24 11/16/2009   NA 137 05/26/2010   K 3.7 05/26/2010   CL 104 05/26/2010   CREATININE 0.8 05/26/2010   BUN 15 05/26/2010   CO2 24 05/26/2010   TSH 1.96 11/16/2009   PSA 0.24 11/16/2009   HGBA1C 8.9* 05/26/2010   MICROALBUR 1.8 05/29/2010

## 2010-10-05 NOTE — Assessment & Plan Note (Signed)
Works outside in the sun, will tx with septra ds (avoid doxy in this case) course, Mild to mod, for antibx course,  to f/u any worsening symptoms or concerns

## 2010-10-05 NOTE — Patient Instructions (Addendum)
Take all new medications as prescribed Continue all other medications as before  

## 2010-10-05 NOTE — Assessment & Plan Note (Signed)
Lab Results  Component Value Date   HGBA1C 8.9* 05/26/2010    stable overall by hx and exam, most recent data reviewed with pt, and pt to continue medical treatment as before

## 2010-12-01 ENCOUNTER — Other Ambulatory Visit: Payer: BC Managed Care – PPO

## 2010-12-01 ENCOUNTER — Telehealth: Payer: Self-pay

## 2010-12-01 DIAGNOSIS — Z0000 Encounter for general adult medical examination without abnormal findings: Secondary | ICD-10-CM

## 2010-12-01 DIAGNOSIS — Z1289 Encounter for screening for malignant neoplasm of other sites: Secondary | ICD-10-CM

## 2010-12-01 NOTE — Telephone Encounter (Signed)
Put order in for physical labs. 

## 2010-12-05 ENCOUNTER — Other Ambulatory Visit: Payer: BC Managed Care – PPO

## 2010-12-08 ENCOUNTER — Other Ambulatory Visit: Payer: Self-pay | Admitting: Internal Medicine

## 2010-12-08 ENCOUNTER — Encounter: Payer: BC Managed Care – PPO | Admitting: Internal Medicine

## 2010-12-08 ENCOUNTER — Other Ambulatory Visit (INDEPENDENT_AMBULATORY_CARE_PROVIDER_SITE_OTHER): Payer: BC Managed Care – PPO

## 2010-12-08 DIAGNOSIS — Z1289 Encounter for screening for malignant neoplasm of other sites: Secondary | ICD-10-CM

## 2010-12-08 DIAGNOSIS — Z0000 Encounter for general adult medical examination without abnormal findings: Secondary | ICD-10-CM

## 2010-12-08 LAB — CBC WITH DIFFERENTIAL/PLATELET
Basophils Absolute: 0.1 10*3/uL (ref 0.0–0.1)
Eosinophils Absolute: 0.1 10*3/uL (ref 0.0–0.7)
Hemoglobin: 14.5 g/dL (ref 13.0–17.0)
Lymphocytes Relative: 29.1 % (ref 12.0–46.0)
MCHC: 33.8 g/dL (ref 30.0–36.0)
Monocytes Relative: 7.2 % (ref 3.0–12.0)
Neutro Abs: 4.6 10*3/uL (ref 1.4–7.7)
Neutrophils Relative %: 61.2 % (ref 43.0–77.0)
Platelets: 207 10*3/uL (ref 150.0–400.0)
RDW: 13.5 % (ref 11.5–14.6)

## 2010-12-08 LAB — BASIC METABOLIC PANEL
CO2: 25 mEq/L (ref 19–32)
Chloride: 104 mEq/L (ref 96–112)
Creatinine, Ser: 0.8 mg/dL (ref 0.4–1.5)
Potassium: 4.2 mEq/L (ref 3.5–5.1)
Sodium: 136 mEq/L (ref 135–145)

## 2010-12-08 LAB — URINALYSIS, ROUTINE W REFLEX MICROSCOPIC
Bilirubin Urine: NEGATIVE
Hgb urine dipstick: NEGATIVE
Nitrite: POSITIVE
Total Protein, Urine: NEGATIVE
Urine Glucose: 500
pH: 6 (ref 5.0–8.0)

## 2010-12-08 LAB — LIPID PANEL
Cholesterol: 151 mg/dL (ref 0–200)
Triglycerides: 240 mg/dL — ABNORMAL HIGH (ref 0.0–149.0)

## 2010-12-08 LAB — HEPATIC FUNCTION PANEL
ALT: 32 U/L (ref 0–53)
Alkaline Phosphatase: 54 U/L (ref 39–117)
Bilirubin, Direct: 0.1 mg/dL (ref 0.0–0.3)
Total Bilirubin: 0.6 mg/dL (ref 0.3–1.2)
Total Protein: 6.9 g/dL (ref 6.0–8.3)

## 2010-12-12 ENCOUNTER — Ambulatory Visit (INDEPENDENT_AMBULATORY_CARE_PROVIDER_SITE_OTHER): Payer: BC Managed Care – PPO | Admitting: Internal Medicine

## 2010-12-12 ENCOUNTER — Encounter: Payer: Self-pay | Admitting: Internal Medicine

## 2010-12-12 VITALS — BP 130/70 | HR 76 | Temp 98.2°F | Ht 69.0 in | Wt 248.0 lb

## 2010-12-12 DIAGNOSIS — E785 Hyperlipidemia, unspecified: Secondary | ICD-10-CM

## 2010-12-12 DIAGNOSIS — E119 Type 2 diabetes mellitus without complications: Secondary | ICD-10-CM

## 2010-12-12 DIAGNOSIS — Z0000 Encounter for general adult medical examination without abnormal findings: Secondary | ICD-10-CM

## 2010-12-12 MED ORDER — GLUCOSE BLOOD VI STRP: ORAL_STRIP | Status: AC

## 2010-12-12 MED ORDER — CLOTRIMAZOLE-BETAMETHASONE 1-0.05 % EX CREA: TOPICAL_CREAM | Freq: Two times a day (BID) | CUTANEOUS | Status: DC

## 2010-12-12 MED ORDER — GLIMEPIRIDE 4 MG PO TABS: 4.0000 mg | ORAL_TABLET | Freq: Two times a day (BID) | ORAL | Status: DC

## 2010-12-12 MED ORDER — LOVASTATIN 40 MG PO TABS: 40.0000 mg | ORAL_TABLET | Freq: Every day | ORAL | Status: DC

## 2010-12-12 MED ORDER — METFORMIN HCL 500 MG PO TABS: ORAL_TABLET | ORAL | Status: DC

## 2010-12-12 NOTE — Assessment & Plan Note (Addendum)
Overall doing well, age appropriate education and counseling updated, referrals for preventative services and immunizations addressed, dietary and smoking counseling addressed, most recent labs and ECG reviewed.  I have personally reviewed and have noted: 1) the patient's medical and social history 2) The pt's use of alcohol, tobacco, and illicit drugs 3) The patient's current medications and supplements 4) Functional ability including ADL's, fall risk, home safety risk, hearing and visual impairment 5) Diet and physical activities 6) Evidence for depression or mood disorder 7) The patient's height, weight, and BMI have been recorded in the chart I have made referrals, and provided counseling and education based on review of the above Declines flu shot,  All meds refilled today, ECG reviewed as per emr

## 2010-12-12 NOTE — Patient Instructions (Signed)
Your EKG was ok today All of your medications were refilled today You are otherwise up to date with prevention Continue all other medications as before Please return in 6 mo with Lab testing done 3-5 days before

## 2010-12-12 NOTE — Assessment & Plan Note (Signed)
Mild elev glc recently due to diet excess the day prior ;  Continue all other medications as before,. F/u with labs next visit  Lab Results  Component Value Date   HGBA1C 8.9* 05/26/2010

## 2010-12-17 ENCOUNTER — Encounter: Payer: Self-pay | Admitting: Internal Medicine

## 2010-12-17 NOTE — Progress Notes (Signed)
Subjective:    Patient ID: John Roman, male    DOB: 02-Jul-1955, 55 y.o.   MRN: [id number]  HPI  Here for wellness and f/u;  Overall doing ok;  Pt denies CP, worsening SOB, DOE, wheezing, orthopnea, PND, worsening LE edema, palpitations, dizziness or syncope.  Pt denies neurological change such as new Headache, facial or extremity weakness.  Pt denies polydipsia, polyuria, or low sugar symptoms. Pt states overall good compliance with treatment and medications, good tolerability, and trying to follow lower cholesterol diet.  Pt denies worsening depressive symptoms, suicidal ideation or panic. No fever, wt loss, night sweats, loss of appetite, or other constitutional symptoms.  Pt states good ability with ADL's, low fall risk, home safety reviewed and adequate, no significant changes in hearing or vision, and occasionally active with exercise.  CBG's in low 100's. Past Medical History  Diagnosis Date  . DIABETES MELLITUS, TYPE II 09/28/2006  . HYPERLIPIDEMIA 09/28/2006  . OBESITY 09/28/2006  . ANXIETY 07/04/2007  . ERECTILE DYSFUNCTION 09/28/2006  . HYPERTENSION 09/28/2006  . ASTHMATIC BRONCHITIS, ACUTE 12/19/2007  . GERD 09/28/2006  . LOW BACK PAIN 09/28/2006  . PLANTAR FASCIITIS, BILATERAL 07/04/2007  . Hematochezia 05/29/2010   Past Surgical History  Procedure Date  . Appendectomy     reports that he has quit smoking. He does not have any smokeless tobacco history on file. He reports that he uses illicit drugs. He reports that he does not drink alcohol. family history includes Colon polyps in his sister; Coronary artery disease in his brother; Diabetes in his brother; Heart attack in his brother; and Hypertension in his brother. No Known Allergies Current Outpatient Prescriptions on File Prior to Visit  Medication Sig Dispense Refill  . aspirin 81 MG EC tablet Take 81 mg by mouth daily.         Review of Systems Review of Systems  Constitutional: Negative for diaphoresis, activity change,  appetite change and unexpected weight change.  HENT: Negative for hearing loss, ear pain, facial swelling, mouth sores and neck stiffness.   Eyes: Negative for pain, redness and visual disturbance.  Respiratory: Negative for shortness of breath and wheezing.   Cardiovascular: Negative for chest pain and palpitations.  Gastrointestinal: Negative for diarrhea, blood in stool, abdominal distention and rectal pain.  Genitourinary: Negative for hematuria, flank pain and decreased urine volume.  Musculoskeletal: Negative for myalgias and joint swelling.  Skin: Negative for color change and wound.  Neurological: Negative for syncope and numbness.  Hematological: Negative for adenopathy.  Psychiatric/Behavioral: Negative for hallucinations, self-injury, decreased concentration and agitation.      Objective:   Physical Exam BP 130/70  Pulse 76  Temp(Src) 98.2 F (36.8 C) (Oral)  Ht 5\' 9"  (1.753 m)  Wt 248 lb (112.492 kg)  BMI 36.62 kg/m2  SpO2 95% Physical Exam  VS noted Constitutional: Pt is oriented to person, place, and time. Appears well-developed and well-nourished.  HENT:  Head: Normocephalic and atraumatic.  Right Ear: External ear normal.  Left Ear: External ear normal.  Nose: Nose normal.  Mouth/Throat: Oropharynx is clear and moist.  Eyes: Conjunctivae and EOM are normal. Pupils are equal, round, and reactive to light.  Neck: Normal range of motion. Neck supple. No JVD present. No tracheal deviation present.  Cardiovascular: Normal rate, regular rhythm, normal heart sounds and intact distal pulses.   Pulmonary/Chest: Effort normal and breath sounds normal.  Abdominal: Soft. Bowel sounds are normal. There is no tenderness.  Musculoskeletal: Normal range of motion. Exhibits no  edema.  Lymphadenopathy:  Has no cervical adenopathy.  Neurological: Pt is alert and oriented to person, place, and time. Pt has normal reflexes. No cranial nerve deficit.  Skin: Skin is warm and dry. No  rash noted.  Psychiatric:  Has  normal mood and affect. Behavior is normal.     Assessment & Plan:

## 2010-12-17 NOTE — Assessment & Plan Note (Signed)
stable overall by hx and exam, most recent data reviewed with pt, and pt to continue medical treatment as before, declines change in tx at this time, goal ldl < 70  Lab Results  Component Value Date   LDLCALC 120* 05/26/2010

## 2011-05-29 ENCOUNTER — Ambulatory Visit: Payer: BC Managed Care – PPO | Admitting: Internal Medicine

## 2011-05-31 ENCOUNTER — Other Ambulatory Visit (INDEPENDENT_AMBULATORY_CARE_PROVIDER_SITE_OTHER): Payer: BC Managed Care – PPO

## 2011-05-31 DIAGNOSIS — E119 Type 2 diabetes mellitus without complications: Secondary | ICD-10-CM

## 2011-05-31 LAB — LIPID PANEL
Cholesterol: 167 mg/dL (ref 0–200)
HDL: 33.1 mg/dL — ABNORMAL LOW (ref >39.00)

## 2011-05-31 LAB — BASIC METABOLIC PANEL
BUN: 14 mg/dL (ref 6–23)
CO2: 25 mEq/L (ref 19–32)
Chloride: 101 mEq/L (ref 96–112)
Glucose, Bld: 237 mg/dL — ABNORMAL HIGH (ref 70–99)
Potassium: 3.8 mEq/L (ref 3.5–5.1)

## 2011-05-31 LAB — HEMOGLOBIN A1C: Hgb A1c MFr Bld: 9.3 % — ABNORMAL HIGH (ref 4.6–6.5)

## 2011-06-01 ENCOUNTER — Encounter: Payer: Self-pay | Admitting: Internal Medicine

## 2011-06-01 ENCOUNTER — Ambulatory Visit (INDEPENDENT_AMBULATORY_CARE_PROVIDER_SITE_OTHER): Payer: BC Managed Care – PPO | Admitting: Internal Medicine

## 2011-06-01 VITALS — BP 122/80 | HR 86 | Temp 98.1°F | Ht 70.0 in | Wt 238.2 lb

## 2011-06-01 DIAGNOSIS — E785 Hyperlipidemia, unspecified: Secondary | ICD-10-CM

## 2011-06-01 DIAGNOSIS — E119 Type 2 diabetes mellitus without complications: Secondary | ICD-10-CM

## 2011-06-01 DIAGNOSIS — I10 Essential (primary) hypertension: Secondary | ICD-10-CM

## 2011-06-01 DIAGNOSIS — Z0000 Encounter for general adult medical examination without abnormal findings: Secondary | ICD-10-CM

## 2011-06-01 MED ORDER — GLIPIZIDE ER 10 MG PO TB24: 10.0000 mg | ORAL_TABLET | Freq: Every day | ORAL | Status: DC

## 2011-06-01 NOTE — Patient Instructions (Signed)
OK to stop the glimeparide Please change to the Glipizide ER 10 mg per day (to take all medication once in the AM) Please take all medication as prescribed, and follow your diet very closely Please call if your sugars are still over 175 to 200, since I think you may need more medication (though may work out if you continue to lose weight) Continue all other medications as before Please do not overuse Alcohol Please return in 6 mo with Lab testing done 3-5 days before

## 2011-06-02 ENCOUNTER — Encounter: Payer: Self-pay | Admitting: Internal Medicine

## 2011-06-02 MED ORDER — GLIMEPIRIDE 4 MG PO TABS: 4.0000 mg | ORAL_TABLET | Freq: Two times a day (BID) | ORAL | Status: DC

## 2011-06-02 NOTE — Progress Notes (Signed)
  Subjective:    Patient ID: John Roman, male    DOB: Jun 24, 1955, 56 y.o.   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  Pt denies new neurological symptoms such as new headache, or facial or extremity weakness or numbness   Pt denies polydipsia, polyuria, or low sugar symptoms such as weakness or confusion improved with po intake.   wt overall down from 248 to 238 but little exercise however. Past Medical History  Diagnosis Date  . DIABETES MELLITUS, TYPE II 09/28/2006  . HYPERLIPIDEMIA 09/28/2006  . OBESITY 09/28/2006  . ANXIETY 07/04/2007  . ERECTILE DYSFUNCTION 09/28/2006  . HYPERTENSION 09/28/2006  . ASTHMATIC BRONCHITIS, ACUTE 12/19/2007  . GERD 09/28/2006  . LOW BACK PAIN 09/28/2006  . PLANTAR FASCIITIS, BILATERAL 07/04/2007  . Hematochezia 05/29/2010   Past Surgical History  Procedure Date  . Appendectomy     reports that he has quit smoking. He does not have any smokeless tobacco history on file. He reports that he uses illicit drugs. He reports that he does not drink alcohol. family history includes Colon polyps in his sister; Coronary artery disease in his brother; Diabetes in his brother; Heart attack in his brother; and Hypertension in his brother. No Known Allergies Current Outpatient Prescriptions on File Prior to Visit  Medication Sig Dispense Refill  . aspirin 81 MG EC tablet Take 81 mg by mouth daily.        . clotrimazole-betamethasone (LOTRISONE) cream Apply topically 2 (two) times daily.  30 g  1  . glucose blood test strip Use as instructed  100 each  12  . lovastatin (MEVACOR) 40 MG tablet Take 1 tablet (40 mg total) by mouth daily.  90 tablet  3  . metFORMIN  (GLUCOPHAGE) 500 MG tablet 2 tabs by mouth twice per day(this is corrected rx)  360 tablet  3   Review of Systems Review of Systems  Constitutional: Negative for diaphoresis and unexpected weight change.   Eyes: Negative for photophobia and visual disturbance.  Respiratory: Negative for choking and stridor.   Gastrointestinal: Negative for vomiting and blood in stool.  Genitourinary: Negative for hematuria and decreased urine volume.  Musculoskeletal: Negative for gait problem.  Skin: Negative for color change and wound.  Neurological: Negative for tremors and numbness.     Objective:   Physical Exam BP 122/80  Pulse 86  Temp(Src) 98.1 F (36.7 C) (Oral)  Ht 5\' 10"  (1.778 m)  Wt 238 lb 4 oz (108.069 kg)  BMI 34.19 kg/m2  SpO2 96% Physical Exam  VS noted, not ill appearing Constitutional: Pt appears well-developed and well-nourished.  HENT: Head: Normocephalic.  Right Ear: External ear normal.  Left Ear: External ear normal.  Eyes: Conjunctivae and EOM are normal. Pupils are equal, round, and reactive to light.  Neck: Normal range of motion. Neck supple.  Cardiovascular: Normal rate and regular rhythm.   Pulmonary/Chest: Effort normal and breath sounds normal.  Neurological: Pt is alert. No cranial nerve deficit.  Skin: Skin is warm. No erythema.  Psychiatric: Pt behavior is normal. Thought content normal. 1-2+ nervous    Assessment & Plan:

## 2011-06-02 NOTE — Assessment & Plan Note (Signed)
stable overall by hx and exam, most recent data reviewed with pt, and pt to continue medical treatment as before  Last ldl May 31 2011 - 101 , goal < 70, declines change in med at this time

## 2011-06-02 NOTE — Assessment & Plan Note (Signed)
Uncontrolled with recent a1c review of > 9,  Pt adamant he will be more complaint with diet and meds, and cont for further wt loss,   to f/u any worsening symptoms or concerns, though I think he should consider med simplification or even add actos Lab Results  Component Value Date   HGBA1C 9.3* 05/31/2011

## 2011-06-02 NOTE — Assessment & Plan Note (Signed)
stable overall by hx and exam, most recent data reviewed with pt, and pt to continue medical treatment as before  BP Readings from Last 3 Encounters:  06/01/11 122/80  12/12/10 130/70  10/05/10 118/70

## 2011-07-06 ENCOUNTER — Other Ambulatory Visit: Payer: Self-pay | Admitting: Internal Medicine

## 2011-08-23 ENCOUNTER — Ambulatory Visit (INDEPENDENT_AMBULATORY_CARE_PROVIDER_SITE_OTHER): Payer: BC Managed Care – PPO | Admitting: Internal Medicine

## 2011-08-23 ENCOUNTER — Encounter: Payer: Self-pay | Admitting: Internal Medicine

## 2011-08-23 VITALS — BP 122/82 | HR 103 | Temp 97.5°F | Ht 70.0 in | Wt 236.1 lb

## 2011-08-23 DIAGNOSIS — T24001A Burn of unspecified degree of unspecified site of right lower limb, except ankle and foot, initial encounter: Secondary | ICD-10-CM

## 2011-08-23 DIAGNOSIS — E119 Type 2 diabetes mellitus without complications: Secondary | ICD-10-CM

## 2011-08-23 DIAGNOSIS — T24009A Burn of unspecified degree of unspecified site of unspecified lower limb, except ankle and foot, initial encounter: Secondary | ICD-10-CM

## 2011-08-23 DIAGNOSIS — I10 Essential (primary) hypertension: Secondary | ICD-10-CM

## 2011-08-23 DIAGNOSIS — L02419 Cutaneous abscess of limb, unspecified: Secondary | ICD-10-CM

## 2011-08-23 MED ORDER — SILVER SULFADIAZINE 1 % EX CREA: TOPICAL_CREAM | Freq: Every day | CUTANEOUS | Status: DC

## 2011-08-23 MED ORDER — SULFAMETHOXAZOLE-TRIMETHOPRIM 800-160 MG PO TABS: 1.0000 | ORAL_TABLET | Freq: Two times a day (BID) | ORAL | Status: AC

## 2011-08-23 NOTE — Patient Instructions (Addendum)
Take all new medications as prescribed Continue all other medications as before Please call for referral to General [facility] if the abscess to the right thigh becomes worse in size, pain, redness or drainage

## 2011-08-25 ENCOUNTER — Encounter: Payer: Self-pay | Admitting: Internal Medicine

## 2011-08-25 NOTE — Assessment & Plan Note (Addendum)
stable overall by hx and exam, most recent data reviewed with pt, and pt to continue medical treatment as before Lab Results  Component Value Date   HGBA1C 9.3* 05/31/2011   For f/u a1c after next visit
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
## 2011-08-25 NOTE — Assessment & Plan Note (Signed)
Mild to mod, for antibx course,  to f/u any worsening symptoms or concerns 

## 2011-08-25 NOTE — Assessment & Plan Note (Signed)
Mild to mod, for silvadene cream,  to f/u any worsening symptoms or concerns

## 2011-08-25 NOTE — Assessment & Plan Note (Signed)
stable overall by hx and exam, most recent data reviewed with pt, and pt to continue medical treatment as before BP Readings from Last 3 Encounters:  08/23/11 122/82  06/01/11 122/80  12/12/10 130/70

## 2011-09-09 ENCOUNTER — Other Ambulatory Visit: Payer: Self-pay | Admitting: Internal Medicine

## 2012-01-10 ENCOUNTER — Other Ambulatory Visit (INDEPENDENT_AMBULATORY_CARE_PROVIDER_SITE_OTHER): Payer: BC Managed Care – PPO

## 2012-01-10 ENCOUNTER — Telehealth: Payer: Self-pay

## 2012-01-10 ENCOUNTER — Other Ambulatory Visit: Payer: Self-pay | Admitting: Internal Medicine

## 2012-01-10 DIAGNOSIS — Z0000 Encounter for general adult medical examination without abnormal findings: Secondary | ICD-10-CM

## 2012-01-10 DIAGNOSIS — E119 Type 2 diabetes mellitus without complications: Secondary | ICD-10-CM

## 2012-01-10 LAB — URINALYSIS, ROUTINE W REFLEX MICROSCOPIC
Ketones, ur: NEGATIVE
Leukocytes, UA: NEGATIVE
Specific Gravity, Urine: >=1.03 (ref 1.000–1.030)
Urine Glucose: 500
pH: 6 (ref 5.0–8.0)

## 2012-01-10 LAB — HEPATIC FUNCTION PANEL
AST: 31 U/L (ref 0–37)
Alkaline Phosphatase: 57 U/L (ref 39–117)
Total Bilirubin: 0.7 mg/dL (ref 0.3–1.2)

## 2012-01-10 LAB — PSA: PSA: 0.29 ng/mL (ref 0.10–4.00)

## 2012-01-10 LAB — CBC WITH DIFFERENTIAL/PLATELET
Basophils Absolute: 0.1 10*3/uL (ref 0.0–0.1)
Eosinophils Absolute: 0.1 10*3/uL (ref 0.0–0.7)
Eosinophils Relative: 1.6 % (ref 0.0–5.0)
MCHC: 33.9 g/dL (ref 30.0–36.0)
MCV: 83.2 fl (ref 78.0–100.0)
Monocytes Absolute: 0.6 10*3/uL (ref 0.1–1.0)
Neutrophils Relative %: 55.7 % (ref 43.0–77.0)
Platelets: 207 10*3/uL (ref 150.0–400.0)
WBC: 7.4 10*3/uL (ref 4.5–10.5)

## 2012-01-10 LAB — BASIC METABOLIC PANEL
GFR: 118.91 mL/min (ref >60.00)
Potassium: 4.3 mEq/L (ref 3.5–5.1)
Sodium: 133 mEq/L — ABNORMAL LOW (ref 135–145)

## 2012-01-10 LAB — LIPID PANEL
Cholesterol: 153 mg/dL (ref 0–200)
Total CHOL/HDL Ratio: 6
Triglycerides: 175 mg/dL — ABNORMAL HIGH (ref 0.0–149.0)

## 2012-01-10 LAB — MICROALBUMIN / CREATININE URINE RATIO
Creatinine,U: 185.8 mg/dL
Microalb Creat Ratio: 2.4 mg/g (ref 0.0–30.0)
Microalb, Ur: 4.4 mg/dL — ABNORMAL HIGH (ref 0.0–1.9)

## 2012-01-10 NOTE — Telephone Encounter (Signed)
Labs ordered.

## 2012-01-10 NOTE — Telephone Encounter (Signed)
A user error has taken place: encounter opened in error, closed for administrative reasons.

## 2012-01-11 ENCOUNTER — Ambulatory Visit (INDEPENDENT_AMBULATORY_CARE_PROVIDER_SITE_OTHER): Payer: BC Managed Care – PPO | Admitting: Internal Medicine

## 2012-01-11 ENCOUNTER — Encounter: Payer: Self-pay | Admitting: Internal Medicine

## 2012-01-11 VITALS — BP 110/70 | HR 90 | Temp 98.0°F | Ht 69.0 in | Wt 242.0 lb

## 2012-01-11 DIAGNOSIS — Z0000 Encounter for general adult medical examination without abnormal findings: Secondary | ICD-10-CM

## 2012-01-11 DIAGNOSIS — E119 Type 2 diabetes mellitus without complications: Secondary | ICD-10-CM

## 2012-01-11 LAB — HEMOGLOBIN A1C: Hgb A1c MFr Bld: 10.2 % — ABNORMAL HIGH (ref 4.6–6.5)

## 2012-01-11 NOTE — Assessment & Plan Note (Signed)
a1c pending, not sure about med compliance with this pt though he states takes meds well, cont diet/wt loss efforts, declines referral to DM education

## 2012-01-11 NOTE — Progress Notes (Signed)
Subjective:    Patient ID: John Roman, male    DOB: [birth year], 56 y.o.   MRN: [id number]  HPI  Here for wellness and f/u;  Overall doing ok;  Pt denies CP, worsening SOB, DOE, wheezing, orthopnea, PND, worsening LE edema, palpitations, dizziness or syncope.  Pt denies neurological change such as new Headache, facial or extremity weakness.  Pt denies polydipsia, polyuria, or low sugar symptoms. Pt states overall good compliance with treatment and medications, good tolerability, and trying to follow lower cholesterol diet.  Pt denies worsening depressive symptoms, suicidal ideation or panic. No fever, wt loss, night sweats, loss of appetite, or other constitutional symptoms.  Pt states good ability with ADL's, low fall risk, home safety reviewed and adequate, no significant changes in hearing or vision, and occasionally active with exercise.  Not sure about cbg's as he is not interested in checking.  Declines colonscopy again, as his father died 2 days after colonoscopy complication with rupture Past Medical History  Diagnosis Date  . DIABETES MELLITUS, TYPE II 09/28/2006  . HYPERLIPIDEMIA 09/28/2006  . OBESITY 09/28/2006  . ANXIETY 07/04/2007  . ERECTILE DYSFUNCTION 09/28/2006  . HYPERTENSION 09/28/2006  . ASTHMATIC BRONCHITIS, ACUTE 12/19/2007  . GERD 09/28/2006  . LOW BACK PAIN 09/28/2006  . PLANTAR FASCIITIS, BILATERAL 07/04/2007  . Hematochezia 05/29/2010   Past Surgical History  Procedure Date  . Appendectomy     reports that he has quit smoking. He does not have any smokeless tobacco history on file. He reports that he uses illicit drugs. He reports that he does not drink alcohol. family history includes Colon polyps in his sister; Coronary artery disease in his brother; Diabetes in his brother; Heart attack in his brother; and Hypertension in his brother. No Known Allergies Current Outpatient Prescriptions on File Prior to Visit  Medication Sig Dispense Refill  . aspirin 81 MG EC tablet  Take 81 mg by mouth daily.        . clotrimazole-betamethasone (LOTRISONE) cream APPLY TOPICALLY TWICE DAILY  30 g  2  . glimepiride (AMARYL) 4 MG tablet Take 1 tablet (4 mg total) by mouth 2 (two) times daily.  180 tablet  3  . metFORMIN (GLUCOPHAGE) 500 MG tablet TAKE TWO TABLETS BY MOUTH TWICE DAILY  360 tablet  0  . silver sulfADIAZINE (SILVADENE) 1 % cream APPLY TOPICALLY DAILY  50 g  1  . lovastatin (MEVACOR) 40 MG tablet Take 1 tablet (40 mg total) by mouth daily.  90 tablet  3   Review of Systems Review of Systems  Constitutional: Negative for diaphoresis, activity change, appetite change and unexpected weight change.  HENT: Negative for hearing loss, ear pain, facial swelling, mouth sores and neck stiffness.   Eyes: Negative for pain, redness and visual disturbance.  Respiratory: Negative for shortness of breath and wheezing.   Cardiovascular: Negative for chest pain and palpitations.  Gastrointestinal: Negative for diarrhea, blood in stool, abdominal distention and rectal pain.  Genitourinary: Negative for hematuria, flank pain and decreased urine volume.  Musculoskeletal: Negative for myalgias and joint swelling.  Skin: Negative for color change and wound.  Neurological: Negative for syncope and numbness.  Hematological: Negative for adenopathy.  Psychiatric/Behavioral: Negative for hallucinations, self-injury, decreased concentration and agitation.      Objective:   Physical Exam BP 110/70  Pulse 90  Temp 98 F (36.7 C) (Oral)  Ht 5\' 9"  (1.753 m)  Wt 242 lb (109.77 kg)  BMI 35.74 kg/m2  SpO2 96% Physical Exam  VS noted Constitutional: Pt is oriented to person, place, and time. Appears well-developed and well-nourished.  HENT:  Head: Normocephalic and atraumatic.  Right Ear: External ear normal.  Left Ear: External ear normal.  Nose: Nose normal.  Mouth/Throat: Oropharynx is clear and moist.  Eyes: Conjunctivae and EOM are normal. Pupils are equal, round, and  reactive to light.  Neck: Normal range of motion. Neck supple. No JVD present. No tracheal deviation present.  Cardiovascular: Normal rate, regular rhythm, normal heart sounds and intact distal pulses.   Pulmonary/Chest: Effort normal and breath sounds normal.  Abdominal: Soft. Bowel sounds are normal. There is no tenderness.  Musculoskeletal: Normal range of motion. Exhibits no edema.  Lymphadenopathy:  Has no cervical adenopathy.  Neurological: Pt is alert and oriented to person, place, and time. Pt has normal reflexes. No cranial nerve deficit.  Skin: Skin is warm and dry. No rash noted.  Psychiatric:  Has  normal mood and affect. Behavior is normal.     Assessment & Plan:

## 2012-01-11 NOTE — Patient Instructions (Addendum)
Continue all other medications as before Please have the pharmacy call with any other refills you may need. Please continue your efforts at being more active, low cholesterol diabetic diet, and weight control. You are given the copy of your lab work, although the A1c is still pending We may need to change your medication if the A1c is elevated, so you may get a call later You will be contacted by phone if any changes need to be made.   Otherwise, you will receive a letter about your results with an explanation Please remember to sign up for My Chart at your earliest convenience, as this will be important to you in the future with finding out test results. Please call if you change your mind about having the colonoscopy done You are otherwise up to date with prevention Please return in 6 mo with Lab testing done 3-5 days before

## 2012-01-11 NOTE — Assessment & Plan Note (Signed)
Overall doing well, age appropriate education and counseling updated, referrals for preventative services and immunizations addressed, dietary and smoking counseling addressed, most recent labs and ECG reviewed.  I have personally reviewed and have noted: 1) the patient's medical and social history 2) The pt's use of alcohol, tobacco, and illicit drugs 3) The patient's current medications and supplements 4) Functional ability including ADL's, fall risk, home safety risk, hearing and visual impairment 5) Diet and physical activities 6) Evidence for depression or mood disorder 7) The patient's height, weight, and BMI have been recorded in the chart I have made referrals, and provided counseling and education based on review of the above Declines colonsocoyp

## 2012-02-27 ENCOUNTER — Other Ambulatory Visit: Payer: Self-pay

## 2012-02-27 MED ORDER — GLIMEPIRIDE 4 MG PO TABS: 4.0000 mg | ORAL_TABLET | Freq: Two times a day (BID) | ORAL | Status: DC

## 2012-07-11 ENCOUNTER — Ambulatory Visit: Payer: BC Managed Care – PPO | Admitting: Internal Medicine

## 2012-09-11 ENCOUNTER — Ambulatory Visit (INDEPENDENT_AMBULATORY_CARE_PROVIDER_SITE_OTHER): Payer: BC Managed Care – PPO | Admitting: Internal Medicine

## 2012-09-11 ENCOUNTER — Other Ambulatory Visit (INDEPENDENT_AMBULATORY_CARE_PROVIDER_SITE_OTHER): Payer: BC Managed Care – PPO

## 2012-09-11 ENCOUNTER — Encounter: Payer: Self-pay | Admitting: Internal Medicine

## 2012-09-11 VITALS — BP 120/80 | HR 61 | Temp 97.4°F | Ht 70.0 in | Wt 201.8 lb

## 2012-09-11 DIAGNOSIS — H60399 Other infective otitis externa, unspecified ear: Secondary | ICD-10-CM

## 2012-09-11 DIAGNOSIS — N400 Enlarged prostate without lower urinary tract symptoms: Secondary | ICD-10-CM

## 2012-09-11 DIAGNOSIS — E119 Type 2 diabetes mellitus without complications: Secondary | ICD-10-CM

## 2012-09-11 DIAGNOSIS — Z550 Illiteracy and low-level literacy: Secondary | ICD-10-CM

## 2012-09-11 DIAGNOSIS — I10 Essential (primary) hypertension: Secondary | ICD-10-CM

## 2012-09-11 DIAGNOSIS — H60393 Other infective otitis externa, bilateral: Secondary | ICD-10-CM

## 2012-09-11 DIAGNOSIS — Z0000 Encounter for general adult medical examination without abnormal findings: Secondary | ICD-10-CM

## 2012-09-11 DIAGNOSIS — R21 Rash and other nonspecific skin eruption: Secondary | ICD-10-CM

## 2012-09-11 HISTORY — DX: Illiteracy and low-level literacy: Z55.0

## 2012-09-11 LAB — BASIC METABOLIC PANEL
Calcium: 9.4 mg/dL (ref 8.4–10.5)
Chloride: 104 mEq/L (ref 96–112)
Creatinine, Ser: 0.8 mg/dL (ref 0.4–1.5)
Sodium: 137 mEq/L (ref 135–145)

## 2012-09-11 LAB — LIPID PANEL
LDL Cholesterol: 113 mg/dL — ABNORMAL HIGH (ref 0–99)
Total CHOL/HDL Ratio: 5
Triglycerides: 69 mg/dL (ref 0.0–149.0)

## 2012-09-11 LAB — HEPATIC FUNCTION PANEL
Bilirubin, Direct: 0.1 mg/dL (ref 0.0–0.3)
Total Bilirubin: 0.5 mg/dL (ref 0.3–1.2)

## 2012-09-11 MED ORDER — CLOTRIMAZOLE-BETAMETHASONE 1-0.05 % EX CREA: TOPICAL_CREAM | Freq: Two times a day (BID) | CUTANEOUS | Status: DC

## 2012-09-11 MED ORDER — NEOMYCIN-POLYMYXIN-HC 3.5-10000-1 OT SOLN: 3.0000 [drp] | Freq: Four times a day (QID) | OTIC | Status: DC

## 2012-09-11 MED ORDER — CIPROFLOXACIN HCL 500 MG PO TABS: 500.0000 mg | ORAL_TABLET | Freq: Two times a day (BID) | ORAL | Status: DC

## 2012-09-11 NOTE — Assessment & Plan Note (Signed)
stable overall by history and exam, recent data reviewed with pt, and pt to continue medical treatment as before,  to f/u any worsening symptoms or concerns BP Readings from Last 3 Encounters:  09/11/12 120/80  01/11/12 110/70  08/23/11 122/82

## 2012-09-11 NOTE — Patient Instructions (Addendum)
Please take all new medication as prescribed - the ear drops, and the pill antibiotics for the ears Please continue all other medications as before, and refills have been done if requested. - the cream A prescription for the Cialis 5 mg daily is sent to the pharmacy, and you are given the coupon for the free first month Please go to the LAB in the Basement (turn left off the elevator) for the tests to be done today You will be contacted by phone if any changes need to be made immediately.  Otherwise, you will receive a letter about your results with an explanation, but please check with MyChart first.  Please remember to sign up for My Chart if you have not done so, as this will be important to you in the future with finding out test results, communicating by private email, and scheduling acute appointments online when needed.  Please return in 6 months, or sooner if needed, with Lab testing done 3-5 days before

## 2012-09-11 NOTE — Progress Notes (Signed)
Subjective:    Patient ID: John Roman, male    DOB: 06-Nov-1955, 57 y.o.   MRN: [id number]  HPI  Here with 2-3 days onset bilat ear pain right > left with d/c on the right, with fever, feeling ill, general weakness and malaise, but no HA, ST, cough and Pt denies chest pain, increased sob or doe, wheezing, orthopnea, PND, increased LE swelling, palpitations, dizziness or syncope.  Has recurring rash to legs better with lotrisone and asks for refills as it cont's to work well.  Has ongoing mild BPH symptoms, asks for cialis daily refill as this seemed to help.   Hx of dyslexia/illiterate.   Pt denies polydipsia, polyuria, or low sugar symptoms such as weakness or confusion improved with po intake.  Pt denies new neurological symptoms  Past Medical History  Diagnosis Date  . DIABETES MELLITUS, TYPE II 09/28/2006  . HYPERLIPIDEMIA 09/28/2006  . OBESITY 09/28/2006  . ANXIETY 07/04/2007  . ERECTILE DYSFUNCTION 09/28/2006  . HYPERTENSION 09/28/2006  . ASTHMATIC BRONCHITIS, ACUTE 12/19/2007  . GERD 09/28/2006  . LOW BACK PAIN 09/28/2006  . PLANTAR FASCIITIS, BILATERAL 07/04/2007  . Hematochezia 05/29/2010   Past Surgical History  Procedure Laterality Date  . Appendectomy      reports that he has quit smoking. He does not have any smokeless tobacco history on file. He reports that he uses illicit drugs. He reports that he does not drink alcohol. family history includes Colon polyps in his sister; Coronary artery disease in his brother; Diabetes in his brother; Heart attack in his brother; and Hypertension in his brother. No Known Allergies Current Outpatient Prescriptions on File Prior to Visit  Medication Sig Dispense Refill  . aspirin 81 MG EC tablet Take 81 mg by mouth daily.        Marland Kitchen glimepiride (AMARYL) 4 MG tablet Take 1 tablet (4 mg total) by mouth 2 (two) times daily.  60 tablet  11  . metFORMIN (GLUCOPHAGE) 500 MG tablet TAKE TWO TABLETS BY MOUTH TWICE DAILY  360 tablet  0  . silver  sulfADIAZINE (SILVADENE) 1 % cream APPLY TOPICALLY DAILY  50 g  1  . lovastatin (MEVACOR) 40 MG tablet Take 1 tablet (40 mg total) by mouth daily.  90 tablet  3   No current facility-administered medications on file prior to visit.   Review of Systems  Constitutional: Negative for unexpected weight change, or unusual diaphoresis  HENT: Negative for tinnitus.   Eyes: Negative for photophobia and visual disturbance.  Respiratory: Negative for choking and stridor.   Gastrointestinal: Negative for vomiting and blood in stool.  Genitourinary: Negative for hematuria and decreased urine volume.  Musculoskeletal: Negative for acute joint swelling Skin: Negative for color change and wound.  Neurological: Negative for tremors and numbness other than noted  Psychiatric/Behavioral: Negative for decreased concentration or  hyperactivity.       Objective:   Physical Exam BP 120/80  Pulse 61  Temp(Src) 97.4 F (36.3 C) (Oral)  Ht 5\' 10"  (1.778 m)  Wt 201 lb 12 oz (91.513 kg)  BMI 28.95 kg/m2  SpO2 97% VS noted,  Constitutional: Pt appears well-developed and well-nourished.  HENT: Head: NCAT.  Right Ear: External ear normal.  Left Ear: External ear normal.  Bilat tm's with mild erythema.  Max sinus areas non tender.  Pharynx with mild erythema, no exudate;  bilat canals with right > left mucoid d/c and erythema/swelling Eyes: Conjunctivae and EOM are normal. Pupils are equal, round, and reactive to  light.  Neck: Normal range of motion. Neck supple.  Cardiovascular: Normal rate and regular rhythm.   Pulmonary/Chest: Effort normal and breath sounds normal.  Neurological: Pt is alert. Not confused  Skin: Skin is warm. No erythema. No current rash Psychiatric: Pt behavior is normal. Thought content normal. 1+ nervous    Assessment & Plan:

## 2012-09-11 NOTE — Assessment & Plan Note (Signed)
stable overall by history and exam, recent data reviewed with pt, and pt to continue medical treatment as before,  to f/u any worsening symptoms or concerns Lab Results  Component Value Date   HGBA1C 7.0* 09/11/2012

## 2012-09-11 NOTE — Assessment & Plan Note (Signed)
Requests daily cialis 5 mg

## 2012-09-11 NOTE — Assessment & Plan Note (Addendum)
Mild to mod, right >> left, for antibx course,  to f/u any worsening symptoms or concerns  Note:  Total time for pt hx, exam, review of record with pt in the room, determination of diagnoses and plan for further eval and tx is > 40 min, with over 50% spent in coordination and counseling of patient

## 2012-09-11 NOTE — Assessment & Plan Note (Signed)
?   Eczema vs other - for lotrisone refill, has worked well

## 2012-09-12 ENCOUNTER — Telehealth: Payer: Self-pay | Admitting: Internal Medicine

## 2012-09-12 MED ORDER — TADALAFIL 5 MG PO TABS: 5.0000 mg | ORAL_TABLET | Freq: Every day | ORAL | Status: DC

## 2012-09-12 NOTE — Telephone Encounter (Signed)
Done erx 

## 2012-09-12 NOTE — Telephone Encounter (Signed)
Message copied by Corwin Levins on Fri Sep 12, 2012 10:29 AM ------      Message from: Scharlene Gloss B      Created: Fri Sep 12, 2012  8:12 AM       The patient called back this am and you did not send in Cialis please send in. ------

## 2012-12-17 ENCOUNTER — Telehealth: Payer: Self-pay

## 2012-12-17 NOTE — Telephone Encounter (Signed)
The patient was seen in August for left ear pain and given a antibiotic. The patient is having the same symptoms and would like to know if he could get a refill on the antibiotic, if so send to [facility]

## 2012-12-17 NOTE — Telephone Encounter (Signed)
Patient informed. 

## 2012-12-17 NOTE — Telephone Encounter (Signed)
Unfort, we need to consider OV due to office policy

## 2012-12-23 ENCOUNTER — Ambulatory Visit (INDEPENDENT_AMBULATORY_CARE_PROVIDER_SITE_OTHER): Payer: BC Managed Care – PPO | Admitting: Internal Medicine

## 2012-12-23 ENCOUNTER — Encounter: Payer: Self-pay | Admitting: Internal Medicine

## 2012-12-23 VITALS — BP 120/80 | HR 68 | Temp 97.8°F | Ht 69.0 in | Wt 230.4 lb

## 2012-12-23 DIAGNOSIS — H60392 Other infective otitis externa, left ear: Secondary | ICD-10-CM

## 2012-12-23 DIAGNOSIS — I10 Essential (primary) hypertension: Secondary | ICD-10-CM

## 2012-12-23 DIAGNOSIS — H60399 Other infective otitis externa, unspecified ear: Secondary | ICD-10-CM

## 2012-12-23 DIAGNOSIS — E119 Type 2 diabetes mellitus without complications: Secondary | ICD-10-CM

## 2012-12-23 MED ORDER — CIPROFLOXACIN HCL 500 MG PO TABS: 500.0000 mg | ORAL_TABLET | Freq: Two times a day (BID) | ORAL | Status: DC

## 2012-12-23 MED ORDER — NEOMYCIN-POLYMYXIN-HC 3.5-10000-1 OT SOLN: 3.0000 [drp] | Freq: Four times a day (QID) | OTIC | Status: DC

## 2012-12-23 NOTE — Progress Notes (Signed)
Subjective:    Patient ID: John Roman, male    DOB: 03-12-1955, 57 y.o.   MRN: [id number]  HPI  Here with onset as per visit April this yr of 3-4 grad onset left ear pain, d/c, ? Low grade temp but no fever, ST, cough, HA and Pt denies chest pain, increased sob or doe, wheezing, orthopnea, PND, increased LE swelling, palpitations, dizziness or syncope.  Spends time in a hot tub occasionally.  Here to f/u; overall doing ok,  Pt denies chest pain, increased sob or doe, wheezing, orthopnea, PND, increased LE swelling, palpitations, dizziness or syncope.  Pt denies polydipsia, polyuria, or low sugar symptoms such as weakness or confusion improved with po intake.  Pt denies new neurological symptoms such as new headache, or facial or extremity weakness or numbness.   Pt states overall good compliance with meds, has been trying to follow lower cholesterol, diabetic diet, with wt overall stable,  but little exercise however. Past Medical History  Diagnosis Date  . DIABETES MELLITUS, TYPE II 09/28/2006  . HYPERLIPIDEMIA 09/28/2006  . OBESITY 09/28/2006  . ANXIETY 07/04/2007  . ERECTILE DYSFUNCTION 09/28/2006  . HYPERTENSION 09/28/2006  . ASTHMATIC BRONCHITIS, ACUTE 12/19/2007  . GERD 09/28/2006  . LOW BACK PAIN 09/28/2006  . PLANTAR FASCIITIS, BILATERAL 07/04/2007  . Hematochezia 05/29/2010  . Illiterate 09/11/2012   Past Surgical History  Procedure Laterality Date  . Appendectomy      reports that he has quit smoking. He does not have any smokeless tobacco history on file. He reports that he uses illicit drugs. He reports that he does not drink alcohol. family history includes Colon polyps in his sister; Coronary artery disease in his brother; Diabetes in his brother; Heart attack in his brother; Hypertension in his brother. No Known Allergies Current Outpatient Prescriptions on File Prior to Visit  Medication Sig Dispense Refill  . lovastatin (MEVACOR) 40 MG tablet Take 1 tablet (40 mg total) by mouth  daily.  90 tablet  3  . silver sulfADIAZINE (SILVADENE) 1 % cream APPLY TOPICALLY DAILY  50 g  1   No current facility-administered medications on file prior to visit.   Review of Systems  Constitutional: Negative for unexpected weight change, or unusual diaphoresis  HENT: Negative for tinnitus.   Eyes: Negative for photophobia and visual disturbance.  Respiratory: Negative for choking and stridor.   Gastrointestinal: Negative for vomiting and blood in stool.  Genitourinary: Negative for hematuria and decreased urine volume.  Musculoskeletal: Negative for acute joint swelling Skin: Negative for color change and wound.  Neurological: Negative for tremors and numbness other than noted  Psychiatric/Behavioral: Negative for decreased concentration or  hyperactivity.       Objective:   Physical Exam BP 120/80  Pulse 68  Temp(Src) 97.8 F (36.6 C) (Oral)  Ht 5\' 9"  (1.753 m)  Wt 230 lb 6 oz (104.497 kg)  BMI 34.00 kg/m2  SpO2 95% VS noted,  Constitutional: Pt appears well-developed and well-nourished.  HENT: Head: NCAT.  Right Ear: External ear normal.  Left Ear: External ear normal. but left canal with 2-3+ canal red/sweling/mucous d/c Eyes: Conjunctivae and EOM are normal. Pupils are equal, round, and reactive to light.  Neck: Normal range of motion. Neck supple.  Cardiovascular: Normal rate and regular rhythm.   Pulmonary/Chest: Effort normal and breath sounds normal.  Abd:  Soft, NT, non-distended, + BS Neurological: Pt is alert. Not confused  Skin: Skin is warm. No erythema.  Psychiatric: Pt behavior is normal. Thought  content normal.         Assessment & Plan:

## 2012-12-23 NOTE — Patient Instructions (Signed)
Please take all new medication as prescribed Please continue all other medications as before, and refills have been done if requested.  

## 2012-12-23 NOTE — Assessment & Plan Note (Signed)
stable overall by history and exam, recent data reviewed with pt, and pt to continue medical treatment as before,  to f/u any worsening symptoms or concerns Lab Results  Component Value Date   HGBA1C 7.0* 09/11/2012

## 2012-12-23 NOTE — Assessment & Plan Note (Signed)
stable overall by history and exam, recent data reviewed with pt, and pt to continue medical treatment as before,  to f/u any worsening symptoms or concerns BP Readings from Last 3 Encounters:  12/23/12 120/80  09/11/12 120/80  01/11/12 110/70

## 2012-12-23 NOTE — Progress Notes (Signed)
Pre-visit discussion using our clinic review tool. No additional management support is needed unless otherwise documented below in the visit note.  

## 2012-12-23 NOTE — Assessment & Plan Note (Signed)
Mild to mod, for antibx course,  to f/u any worsening symptoms or concerns 

## 2013-03-03 ENCOUNTER — Telehealth: Payer: Self-pay | Admitting: Internal Medicine
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
## 2013-03-03 NOTE — Telephone Encounter (Signed)
Please see Nancy/NP for the problem to see if this is appropriate, since this is in part and antibiotic, and office policy is to see pt for antibx to make sure is approp

## 2013-03-04 ENCOUNTER — Encounter: Payer: Self-pay | Admitting: Internal Medicine

## 2013-03-04 ENCOUNTER — Ambulatory Visit (INDEPENDENT_AMBULATORY_CARE_PROVIDER_SITE_OTHER): Payer: BC Managed Care – PPO | Admitting: Internal Medicine

## 2013-03-04 VITALS — BP 112/82 | HR 94 | Temp 97.4°F | Wt 223.2 lb

## 2013-03-04 DIAGNOSIS — T2017XA Burn of first degree of neck, initial encounter: Secondary | ICD-10-CM

## 2013-03-04 MED ORDER — SILVER SULFADIAZINE 1 % EX CREA: TOPICAL_CREAM | CUTANEOUS | Status: DC

## 2013-03-04 NOTE — Patient Instructions (Addendum)
Please take all new medication as prescribed  Please continue all other medications as before, and refills have been done if requested.  Please have the pharmacy call with any other refills you may need.   

## 2013-03-04 NOTE — Telephone Encounter (Signed)
LMOM to call for an appt. °
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
## 2013-03-04 NOTE — Assessment & Plan Note (Signed)
Ok to cont silvadene refill to left neck and trapezoid as he has been doing, no evidence for other cellulitis, abscess.   to f/u any worsening symptoms or concerns

## 2013-03-04 NOTE — Progress Notes (Signed)
Pre-visit discussion using our clinic review tool. No additional management support is needed unless otherwise documented below in the visit note.  

## 2013-05-11 ENCOUNTER — Encounter: Payer: Self-pay | Admitting: Internal Medicine

## 2013-05-11 ENCOUNTER — Ambulatory Visit (INDEPENDENT_AMBULATORY_CARE_PROVIDER_SITE_OTHER): Payer: BC Managed Care – PPO | Admitting: Internal Medicine

## 2013-05-11 VITALS — BP 116/80 | HR 92 | Temp 98.8°F | Resp 16 | Ht 70.0 in | Wt 219.0 lb

## 2013-05-11 DIAGNOSIS — J069 Acute upper respiratory infection, unspecified: Secondary | ICD-10-CM | POA: Insufficient documentation

## 2013-05-11 DIAGNOSIS — E119 Type 2 diabetes mellitus without complications: Secondary | ICD-10-CM

## 2013-05-11 DIAGNOSIS — R197 Diarrhea, unspecified: Secondary | ICD-10-CM | POA: Insufficient documentation

## 2013-05-11 MED ORDER — PROMETHAZINE-CODEINE 6.25-10 MG/5ML PO SYRP: 5.0000 mL | ORAL_SOLUTION | ORAL | Status: DC | PRN

## 2013-05-11 MED ORDER — LEVOFLOXACIN 500 MG PO TABS: 500.0000 mg | ORAL_TABLET | Freq: Every day | ORAL | Status: DC

## 2013-05-11 NOTE — Progress Notes (Signed)
Pre visit review using our clinic review tool, if applicable. No additional management support is needed unless otherwise documented below in the visit note. 

## 2013-05-11 NOTE — Assessment & Plan Note (Signed)
See abx Prom-cod

## 2013-05-11 NOTE — Patient Instructions (Signed)
Call if problems or come back

## 2013-05-11 NOTE — Assessment & Plan Note (Signed)
Better Imodium prn

## 2013-05-11 NOTE — Assessment & Plan Note (Signed)
  On diet  

## 2013-05-11 NOTE — Progress Notes (Signed)
   Subjective:    Patient ID: John Roman, male    DOB: 14-Dec-1955, 58 y.o.   MRN: [id number]  URI  This is a new problem. The current episode started in the past 7 days. The problem has been gradually worsening. The maximum temperature recorded prior to his arrival was 100 - 100.9 F. Associated symptoms include congestion, coughing, diarrhea (resolved), rhinorrhea, sinus pain and a sore throat. Pertinent negatives include no abdominal pain, chest pain, headaches, neck pain, rash, vomiting or wheezing. He has tried acetaminophen for the symptoms.      Review of Systems  Constitutional: Positive for fever, chills, diaphoresis and fatigue.  HENT: Positive for congestion, rhinorrhea and sore throat.   Respiratory: Positive for cough. Negative for wheezing.   Cardiovascular: Negative for chest pain.  Gastrointestinal: Positive for diarrhea (resolved). Negative for vomiting and abdominal pain.  Musculoskeletal: Negative for neck pain.  Skin: Negative for rash.  Neurological: Negative for dizziness, weakness and headaches.  Psychiatric/Behavioral: The patient is not nervous/anxious.        Objective:   Physical Exam  Constitutional: He is oriented to person, place, and time. He appears well-developed. No distress.  NAD  HENT:  Mouth/Throat: No oropharyngeal exudate.  eryth throat  Eyes: Conjunctivae are normal. Pupils are equal, round, and reactive to light.  Neck: Normal range of motion. No JVD present. No thyromegaly present.  Cardiovascular: Normal rate, regular rhythm, normal heart sounds and intact distal pulses.  Exam reveals no gallop and no friction rub.   No murmur heard. Pulmonary/Chest: Effort normal and breath sounds normal. No respiratory distress. He has no wheezes. He has no rales. He exhibits no tenderness.  Abdominal: Soft. Bowel sounds are normal. He exhibits no distension and no mass. There is no tenderness. There is no rebound and no guarding.  Musculoskeletal:  Normal range of motion. He exhibits no edema and no tenderness.  Lymphadenopathy:    He has no cervical adenopathy.  Neurological: He is alert and oriented to person, place, and time. He has normal reflexes. No cranial nerve deficit. He exhibits normal muscle tone. He displays a negative Romberg sign. Coordination and gait normal.  No meningeal signs  Skin: Skin is warm and dry. No rash noted.  Psychiatric: He has a normal mood and affect. His behavior is normal. Judgment and thought content normal.          Assessment & Plan:

## 2013-05-29 ENCOUNTER — Telehealth: Payer: Self-pay

## 2013-05-29 NOTE — Telephone Encounter (Signed)
Relevant patient education mailed to patient.  

## 2013-06-02 ENCOUNTER — Emergency Department (INDEPENDENT_AMBULATORY_CARE_PROVIDER_SITE_OTHER)
Admission: EM | Admit: 2013-06-02 | Discharge: 2013-06-02 | Disposition: A | Discharge status: Home / Self Care | Payer: BC Managed Care – PPO | Source: Home / Self Care | Attending: Emergency Medicine | Admitting: Emergency Medicine

## 2013-06-02 ENCOUNTER — Encounter (HOSPITAL_COMMUNITY): Payer: Self-pay | Admitting: Emergency Medicine

## 2013-06-02 DIAGNOSIS — S61219A Laceration without foreign body of unspecified finger without damage to nail, initial encounter: Secondary | ICD-10-CM

## 2013-06-02 DIAGNOSIS — S61209A Unspecified open wound of unspecified finger without damage to nail, initial encounter: Secondary | ICD-10-CM

## 2013-06-02 DIAGNOSIS — X58XXXA Exposure to other specified factors, initial encounter: Secondary | ICD-10-CM

## 2013-06-02 NOTE — ED Notes (Signed)
Pt triaged and assessed by provider.   Provider in before nurse. 

## 2013-06-02 NOTE — ED Provider Notes (Signed)
Medical screening examination/treatment/procedure(s) were performed by non-physician practitioner and as supervising physician I was immediately available for consultation/collaboration.  Anyssa Sharpless, M.D.  Brissa Asante C Camaya Gannett, MD 06/02/13 2227 

## 2013-06-02 NOTE — ED Provider Notes (Signed)
CSN: [id number]     Arrival date & time 06/02/13  1954 History   First MD Initiated Contact with Patient 06/02/13 2123     Chief Complaint  Patient presents with  . Extremity Laceration   (Consider location/radiation/quality/duration/timing/severity/associated sxs/prior Treatment) Patient is a 58 y.o. male presenting with skin laceration. The history is provided by the patient. No language interpreter was used.  Laceration Location:  Finger Finger laceration location:  L index finger Length (cm):  1 Depth:  Cutaneous Laceration mechanism:  Metal edge Foreign body present:  No foreign bodies Relieved by:  Nothing Worsened by:  Nothing tried Ineffective treatments:  None tried   Past Medical History  Diagnosis Date  . DIABETES MELLITUS, TYPE II 09/28/2006  . HYPERLIPIDEMIA 09/28/2006  . OBESITY 09/28/2006  . ANXIETY 07/04/2007  . ERECTILE DYSFUNCTION 09/28/2006  . HYPERTENSION 09/28/2006  . ASTHMATIC BRONCHITIS, ACUTE 12/19/2007  . GERD 09/28/2006  . LOW BACK PAIN 09/28/2006  . PLANTAR FASCIITIS, BILATERAL 07/04/2007  . Hematochezia 05/29/2010  . Illiterate 09/11/2012   Past Surgical History  Procedure Laterality Date  . Appendectomy     Family History  Problem Relation Age of Onset  . Colon polyps Sister   . Coronary artery disease Brother   . Heart attack Brother   . Diabetes Brother     2 brothers   . Hypertension Brother    History  Substance Use Topics  . Smoking status: Former Games developermoker  . Smokeless tobacco: Not on file  . Alcohol Use: No    Review of Systems  All other systems reviewed and are negative.   Allergies  Review of patient's allergies indicates no known allergies.  Home Medications   Prior to Admission medications   Medication Sig Start Date End Date Taking? Authorizing Provider  levofloxacin (LEVAQUIN) 500 MG tablet Take 1 tablet (500 mg total) by mouth daily. 05/11/13   Georgina QuintAleksei V Plotnikov, MD  lovastatin (MEVACOR) 40 MG tablet Take 1 tablet (40 mg  total) by mouth daily. 12/12/10 05/11/13  Corwin LevinsJames W John, MD  neomycin-polymyxin-hydrocortisone (CORTISPORIN) otic solution Place 3 drops into both ears 4 (four) times daily. 12/23/12   Corwin LevinsJames W John, MD  promethazine-codeine (PHENERGAN WITH CODEINE) 6.25-10 MG/5ML syrup Take 5 mLs by mouth every 4 (four) hours as needed for cough. 05/11/13   Georgina QuintAleksei V Plotnikov, MD  silver sulfADIAZINE (SILVADENE) 1 % cream APPLY TOPICALLY DAILY 03/04/13   Corwin LevinsJames W John, MD   BP 135/83  Pulse 65  Temp(Src) 97.9 F (36.6 C) (Oral)  SpO2 100% Physical Exam  Constitutional: He is oriented to person, place, and time. He appears well-developed and well-nourished.  Neurological: He is alert and oriented to person, place, and time.  Skin:  1cm laceration finger  Psychiatric: He has a normal mood and affect.    ED Course  LACERATION REPAIR Date/Time: 06/02/2013 9:26 PM Performed by: Elson AreasSOFIA, Almin Livingstone K Authorized by: Leslee HomeKELLER, DAVID C Consent: Verbal consent obtained. Patient identity confirmed: verbally with patient Body area: upper extremity Location details: left ring finger Contamination: The wound is contaminated. Tendon involvement: none Amount of cleaning: standard Debridement: none Patient tolerance: Patient tolerated the procedure well with no immediate complications.   (including critical care time) Labs Review Labs Reviewed - No data to display  Imaging Review No results found.   MDM   1. Laceration of finger        Elson AreasLeslie K Romelle Muldoon, [place] JerseyPA-C 06/02/13 2128

## 2013-06-02 NOTE — Discharge Instructions (Signed)
Tissue Adhesive Wound Care °Some cuts, wounds, lacerations, and incisions can be repaired by using tissue adhesive. Tissue adhesive is like glue. It holds the skin together, allowing for faster healing. It forms a strong bond on the skin in about 1 minute and reaches its full strength in about 2 or 3 minutes. The adhesive disappears naturally while the wound is healing. It is important to take proper care of your wound at home while it heals.  °HOME CARE INSTRUCTIONS  °· Showers are allowed. Do not soak the area containing the tissue adhesive. Do not take baths, swim, or use hot tubs. Do not use any soaps or ointments on the wound. Certain ointments can weaken the glue. °· If a bandage (dressing) has been applied, follow your health care provider's instructions for how often to change the dressing.   °· Keep the dressing dry if one has been applied.   °· Do not scratch, pick, or rub the adhesive.   °· Do not place tape over the adhesive. The adhesive could come off when pulling the tape off.   °· Protect the wound from further injury until it is healed.   °· Protect the wound from sun and tanning bed exposure while it is healing and for several weeks after healing.   °· Only take over-the-counter or prescription medicines as directed by your health care provider.   °· Keep all follow-up appointments as directed by your health care provider. °SEEK IMMEDIATE MEDICAL CARE IF:  °· Your wound becomes red, swollen, hot, or tender.   °· You develop a rash after the glue is applied. °· You have increasing pain in the wound.   °· You have a red streak that goes away from the wound.   °· You have pus coming from the wound.   °· You have increased bleeding. °· You have a fever. °· You have shaking chills.   °· You notice a bad smell coming from the wound.   °· Your wound or adhesive breaks open.   °MAKE SURE YOU:  °· Understand these instructions. °· Will watch your condition. °· Will get help right away if you are not doing  well or get worse. °Document Released: 07/18/2000 Document Revised: 11/12/2012 Document Reviewed: 08/13/2012 °[employer]® Patient Information ©2014 [employer]. ° °

## 2013-10-02 ENCOUNTER — Ambulatory Visit (INDEPENDENT_AMBULATORY_CARE_PROVIDER_SITE_OTHER): Payer: BC Managed Care – PPO | Admitting: Internal Medicine

## 2013-10-02 ENCOUNTER — Other Ambulatory Visit (INDEPENDENT_AMBULATORY_CARE_PROVIDER_SITE_OTHER): Payer: BC Managed Care – PPO

## 2013-10-02 ENCOUNTER — Encounter: Payer: Self-pay | Admitting: Internal Medicine

## 2013-10-02 VITALS — BP 122/82 | HR 75 | Temp 98.1°F | Wt 213.5 lb

## 2013-10-02 DIAGNOSIS — Z0000 Encounter for general adult medical examination without abnormal findings: Secondary | ICD-10-CM

## 2013-10-02 DIAGNOSIS — E119 Type 2 diabetes mellitus without complications: Secondary | ICD-10-CM

## 2013-10-02 LAB — CBC WITH DIFFERENTIAL/PLATELET
BASOS ABS: 0.1 10*3/uL (ref 0.0–0.1)
Basophils Relative: 1.1 % (ref 0.0–3.0)
Eosinophils Absolute: 0.1 10*3/uL (ref 0.0–0.7)
Eosinophils Relative: 1.5 % (ref 0.0–5.0)
HEMATOCRIT: 47.3 % (ref 39.0–52.0)
Hemoglobin: 16.2 g/dL (ref 13.0–17.0)
LYMPHS ABS: 1.7 10*3/uL (ref 0.7–4.0)
Lymphocytes Relative: 27.9 % (ref 12.0–46.0)
MCHC: 34.2 g/dL (ref 30.0–36.0)
MCV: 83.3 fl (ref 78.0–100.0)
MONO ABS: 0.4 10*3/uL (ref 0.1–1.0)
MONOS PCT: 6.7 % (ref 3.0–12.0)
Neutro Abs: 3.9 10*3/uL (ref 1.4–7.7)
Neutrophils Relative %: 62.8 % (ref 43.0–77.0)
Platelets: 237 10*3/uL (ref 150.0–400.0)
RBC: 5.69 Mil/uL (ref 4.22–5.81)
RDW: 14.1 % (ref 11.5–15.5)
WBC: 6.2 10*3/uL (ref 4.0–10.5)

## 2013-10-02 LAB — BASIC METABOLIC PANEL
BUN: 14 mg/dL (ref 6–23)
CHLORIDE: 102 meq/L (ref 96–112)
CO2: 24 mEq/L (ref 19–32)
Calcium: 9.3 mg/dL (ref 8.4–10.5)
Creatinine, Ser: 0.8 mg/dL (ref 0.4–1.5)
GFR: 98.95 mL/min (ref >60.00)
GLUCOSE: 227 mg/dL — AB (ref 70–99)
POTASSIUM: 4.7 meq/L (ref 3.5–5.1)
Sodium: 135 mEq/L (ref 135–145)

## 2013-10-02 LAB — HEPATIC FUNCTION PANEL
ALBUMIN: 4.1 g/dL (ref 3.5–5.2)
ALK PHOS: 60 U/L (ref 39–117)
ALT: 20 U/L (ref 0–53)
AST: 21 U/L (ref 0–37)
Bilirubin, Direct: 0.1 mg/dL (ref 0.0–0.3)
Total Bilirubin: 0.6 mg/dL (ref 0.2–1.2)
Total Protein: 7.3 g/dL (ref 6.0–8.3)

## 2013-10-02 LAB — URINALYSIS, ROUTINE W REFLEX MICROSCOPIC
BILIRUBIN URINE: NEGATIVE
HGB URINE DIPSTICK: NEGATIVE
KETONES UR: NEGATIVE
LEUKOCYTES UA: NEGATIVE
Nitrite: NEGATIVE
Specific Gravity, Urine: 1.015 (ref 1.000–1.030)
Total Protein, Urine: NEGATIVE
Urine Glucose: 100 — AB
Urobilinogen, UA: 0.2 (ref 0.0–1.0)
pH: 6 (ref 5.0–8.0)

## 2013-10-02 LAB — LIPID PANEL
CHOLESTEROL: 186 mg/dL (ref 0–200)
HDL: 26.3 mg/dL — ABNORMAL LOW (ref >39.00)
LDL Cholesterol: 135 mg/dL — ABNORMAL HIGH (ref 0–99)
NonHDL: 159.7
TRIGLYCERIDES: 123 mg/dL (ref 0.0–149.0)
Total CHOL/HDL Ratio: 7
VLDL: 24.6 mg/dL (ref 0.0–40.0)

## 2013-10-02 LAB — MICROALBUMIN / CREATININE URINE RATIO
CREATININE, U: 167 mg/dL
MICROALB UR: 1.2 mg/dL (ref 0.0–1.9)
MICROALB/CREAT RATIO: 0.7 mg/g (ref 0.0–30.0)

## 2013-10-02 LAB — HEMOGLOBIN A1C: HEMOGLOBIN A1C: 10.1 % — AB (ref 4.6–6.5)

## 2013-10-02 LAB — TSH: TSH: 1.45 u[IU]/mL (ref 0.35–4.50)

## 2013-10-02 LAB — PSA: PSA: 0.23 ng/mL (ref 0.10–4.00)

## 2013-10-02 NOTE — Assessment & Plan Note (Signed)

## 2013-10-02 NOTE — Patient Instructions (Signed)

## 2013-10-02 NOTE — Progress Notes (Signed)
Subjective:    Patient ID: John Roman, male    DOB: Nov 13, 1955, 58 y.o.   MRN: [id number]  HPI Here for wellness and f/u;  Illiterate, Overall doing ok;  Pt denies CP, worsening SOB, DOE, wheezing, orthopnea, PND, worsening LE edema, palpitations, dizziness or syncope.  Pt denies neurological change such as new headache, facial or extremity weakness.  Pt denies polydipsia, polyuria, or low sugar symptoms. Pt states overall good compliance with treatment and medications, good tolerability, and has been trying to follow lower cholesterol diet.  Pt denies worsening depressive symptoms, suicidal ideation or panic. No fever, night sweats, wt loss, loss of appetite, or other constitutional symptoms.  Pt states good ability with ADL's, has low fall risk, home safety reviewed and adequate, no other significant changes in hearing or vision, and only occasionally active with exercise. No current complaiints. Declines all colon and immunizations  Has lost approx 70 lbs overall from peak wt approx 2 yrs ago. Declines asa daily Past Medical History  Diagnosis Date  . DIABETES MELLITUS, TYPE II 09/28/2006  . HYPERLIPIDEMIA 09/28/2006  . OBESITY 09/28/2006  . ANXIETY 07/04/2007  . ERECTILE DYSFUNCTION 09/28/2006  . HYPERTENSION 09/28/2006  . ASTHMATIC BRONCHITIS, ACUTE 12/19/2007  . GERD 09/28/2006  . LOW BACK PAIN 09/28/2006  . PLANTAR FASCIITIS, BILATERAL 07/04/2007  . Hematochezia 05/29/2010  . Illiterate 09/11/2012   Past Surgical History  Procedure Laterality Date  . Appendectomy      reports that he has quit smoking. He does not have any smokeless tobacco history on file. He reports that he uses illicit drugs. He reports that he does not drink alcohol. family history includes Colon polyps in his sister; Coronary artery disease in his brother; Diabetes in his brother; Heart attack in his brother; Hypertension in his brother. No Known Allergies Current Outpatient Prescriptions on File Prior to Visit    Medication Sig Dispense Refill  . levofloxacin (LEVAQUIN) 500 MG tablet Take 1 tablet (500 mg total) by mouth daily.  10 tablet  0  . lovastatin (MEVACOR) 40 MG tablet Take 1 tablet (40 mg total) by mouth daily.  90 tablet  3  . neomycin-polymyxin-hydrocortisone (CORTISPORIN) otic solution Place 3 drops into both ears 4 (four) times daily.  10 mL  0  . promethazine-codeine (PHENERGAN WITH CODEINE) 6.25-10 MG/5ML syrup Take 5 mLs by mouth every 4 (four) hours as needed for cough.  300 mL  0  . silver sulfADIAZINE (SILVADENE) 1 % cream APPLY TOPICALLY DAILY  50 g  1   No current facility-administered medications on file prior to visit.   Review of Systems Constitutional: Negative for increased diaphoresis, other activity, appetite or other siginficant weight change  HENT: Negative for worsening hearing loss, ear pain, facial swelling, mouth sores and neck stiffness.   Eyes: Negative for other worsening pain, redness or visual disturbance.  Respiratory: Negative for shortness of breath and wheezing.   Cardiovascular: Negative for chest pain and palpitations.  Gastrointestinal: Negative for diarrhea, blood in stool, abdominal distention or other pain Genitourinary: Negative for hematuria, flank pain or change in urine volume.  Musculoskeletal: Negative for myalgias or other joint complaints.  Skin: Negative for color change and wound.  Neurological: Negative for syncope and numbness. other than noted Hematological: Negative for adenopathy. or other swelling Psychiatric/Behavioral: Negative for hallucinations, self-injury, decreased concentration or other worsening agitation.      Objective:   Physical Exam BP 122/82  Pulse 75  Temp(Src) 98.1 F (36.7 C) (Oral)  Wt 213 lb 8 oz (96.843 kg)  SpO2 97% VS noted,  Constitutional: Pt is oriented to person, place, and time. Appears well-developed and well-nourished.  Head: Normocephalic and atraumatic.  Right Ear: External ear normal.  Left  Ear: External ear normal.  Nose: Nose normal.  Mouth/Throat: Oropharynx is clear and moist.  Eyes: Conjunctivae and EOM are normal. Pupils are equal, round, and reactive to light.  Neck: Normal range of motion. Neck supple. No JVD present. No tracheal deviation present.  Cardiovascular: Normal rate, regular rhythm, normal heart sounds and intact distal pulses.   Pulmonary/Chest: Effort normal and breath sounds without rales or wheezing  Abdominal: Soft. Bowel sounds are normal. NT. No HSM  Musculoskeletal: Normal range of motion. Exhibits no edema.  Lymphadenopathy:  Has no cervical adenopathy.  Neurological: Pt is alert and oriented to person, place, and time. Pt has normal reflexes. No cranial nerve deficit. Motor grossly intact Skin: Skin is warm and dry. No rash noted.  Psychiatric:  Has normal mood and affect. Behavior is normal.     Assessment & Plan:

## 2013-10-02 NOTE — Progress Notes (Signed)
Pre visit review using our clinic review tool, if applicable. No additional management support is needed unless otherwise documented below in the visit note. 

## 2013-10-02 NOTE — Assessment & Plan Note (Signed)
stable overall by history and exam, recent data reviewed with pt, and pt to continue medical treatment as before,  to f/u any worsening symptoms or concerns Lab Results  Component Value Date   HGBA1C 7.0* 09/11/2012   For f/u lab today, f/u OV 6 mo

## 2013-10-06 ENCOUNTER — Telehealth: Payer: Self-pay

## 2013-10-06 NOTE — Telephone Encounter (Signed)
The patient did call back regarding phone call for his lab results from 10/02/13.  The patient was informed of results.  The patient stated he has not been taking his metformin for 2 years, not exercising of eating well.  The patient stated he has plenty of medication at this time.  Advise  Please.

## 2013-10-07 ENCOUNTER — Encounter: Payer: Self-pay | Admitting: Internal Medicine

## 2013-12-18 ENCOUNTER — Encounter: Payer: Self-pay | Admitting: Internal Medicine

## 2013-12-21 ENCOUNTER — Telehealth: Payer: Self-pay | Admitting: Internal Medicine

## 2013-12-21 NOTE — Telephone Encounter (Signed)
Dismissal Letter sent by Certified Mail 12/21/2013  Received the Return Receipt showing someone picked up the Dismissal 12/25/2013

## 2016-02-20 DIAGNOSIS — B372 Candidiasis of skin and nail: Secondary | ICD-10-CM | POA: Diagnosis not present

## 2016-02-20 DIAGNOSIS — R739 Hyperglycemia, unspecified: Secondary | ICD-10-CM | POA: Diagnosis not present

## 2016-12-13 DIAGNOSIS — T07XXXA Unspecified multiple injuries, initial encounter: Secondary | ICD-10-CM | POA: Diagnosis not present

## 2017-04-05 ENCOUNTER — Encounter (HOSPITAL_COMMUNITY): Payer: Self-pay

## 2017-04-05 ENCOUNTER — Inpatient Hospital Stay (HOSPITAL_COMMUNITY): Payer: BLUE CROSS/BLUE SHIELD

## 2017-04-05 ENCOUNTER — Observation Stay (HOSPITAL_COMMUNITY)
Admission: EM | Admit: 2017-04-05 | Discharge: 2017-04-06 | Disposition: A | Discharge status: Home / Self Care | Payer: BLUE CROSS/BLUE SHIELD | Attending: Oncology | Admitting: Oncology

## 2017-04-05 ENCOUNTER — Emergency Department (HOSPITAL_COMMUNITY): Payer: BLUE CROSS/BLUE SHIELD

## 2017-04-05 DIAGNOSIS — Z550 Illiteracy and low-level literacy: Secondary | ICD-10-CM | POA: Diagnosis not present

## 2017-04-05 DIAGNOSIS — I10 Essential (primary) hypertension: Secondary | ICD-10-CM | POA: Diagnosis not present

## 2017-04-05 DIAGNOSIS — E785 Hyperlipidemia, unspecified: Secondary | ICD-10-CM | POA: Insufficient documentation

## 2017-04-05 DIAGNOSIS — Z79899 Other long term (current) drug therapy: Secondary | ICD-10-CM | POA: Insufficient documentation

## 2017-04-05 DIAGNOSIS — Z189 Retained foreign body fragments, unspecified material: Secondary | ICD-10-CM | POA: Diagnosis not present

## 2017-04-05 DIAGNOSIS — Z794 Long term (current) use of insulin: Secondary | ICD-10-CM | POA: Insufficient documentation

## 2017-04-05 DIAGNOSIS — I639 Cerebral infarction, unspecified: Secondary | ICD-10-CM | POA: Diagnosis not present

## 2017-04-05 DIAGNOSIS — Z8249 Family history of ischemic heart disease and other diseases of the circulatory system: Secondary | ICD-10-CM | POA: Insufficient documentation

## 2017-04-05 DIAGNOSIS — I6501 Occlusion and stenosis of right vertebral artery: Secondary | ICD-10-CM | POA: Diagnosis not present

## 2017-04-05 DIAGNOSIS — T1490XA Injury, unspecified, initial encounter: Secondary | ICD-10-CM

## 2017-04-05 DIAGNOSIS — E1151 Type 2 diabetes mellitus with diabetic peripheral angiopathy without gangrene: Secondary | ICD-10-CM | POA: Insufficient documentation

## 2017-04-05 DIAGNOSIS — I6300 Cerebral infarction due to thrombosis of unspecified precerebral artery: Secondary | ICD-10-CM | POA: Diagnosis not present

## 2017-04-05 DIAGNOSIS — F419 Anxiety disorder, unspecified: Secondary | ICD-10-CM | POA: Insufficient documentation

## 2017-04-05 DIAGNOSIS — S0990XA Unspecified injury of head, initial encounter: Secondary | ICD-10-CM | POA: Diagnosis not present

## 2017-04-05 DIAGNOSIS — I6521 Occlusion and stenosis of right carotid artery: Secondary | ICD-10-CM | POA: Insufficient documentation

## 2017-04-05 DIAGNOSIS — Z87891 Personal history of nicotine dependence: Secondary | ICD-10-CM | POA: Diagnosis not present

## 2017-04-05 DIAGNOSIS — J342 Deviated nasal septum: Secondary | ICD-10-CM | POA: Diagnosis not present

## 2017-04-05 DIAGNOSIS — S40859A Superficial foreign body of unspecified upper arm, initial encounter: Secondary | ICD-10-CM

## 2017-04-05 DIAGNOSIS — E876 Hypokalemia: Secondary | ICD-10-CM | POA: Insufficient documentation

## 2017-04-05 DIAGNOSIS — Z9114 Patient's other noncompliance with medication regimen: Secondary | ICD-10-CM | POA: Insufficient documentation

## 2017-04-05 DIAGNOSIS — R001 Bradycardia, unspecified: Secondary | ICD-10-CM | POA: Diagnosis not present

## 2017-04-05 DIAGNOSIS — E1110 Type 2 diabetes mellitus with ketoacidosis without coma: Secondary | ICD-10-CM | POA: Insufficient documentation

## 2017-04-05 DIAGNOSIS — R42 Dizziness and giddiness: Secondary | ICD-10-CM | POA: Diagnosis not present

## 2017-04-05 DIAGNOSIS — S40852A Superficial foreign body of left upper arm, initial encounter: Secondary | ICD-10-CM | POA: Diagnosis not present

## 2017-04-05 DIAGNOSIS — S40851A Superficial foreign body of right upper arm, initial encounter: Secondary | ICD-10-CM | POA: Diagnosis not present

## 2017-04-05 LAB — URINALYSIS, ROUTINE W REFLEX MICROSCOPIC
BACTERIA UA: NONE SEEN
BILIRUBIN URINE: NEGATIVE
KETONES UR: 80 mg/dL — AB
Leukocytes, UA: NEGATIVE
NITRITE: NEGATIVE
PH: 5 (ref 5.0–8.0)
Protein, ur: 30 mg/dL — AB
SPECIFIC GRAVITY, URINE: 1.03 (ref 1.005–1.030)
Squamous Epithelial / LPF: NONE SEEN

## 2017-04-05 LAB — CBG MONITORING, ED
GLUCOSE-CAPILLARY: 410 mg/dL — AB (ref 65–99)
GLUCOSE-CAPILLARY: 291 mg/dL — AB (ref 65–99)
GLUCOSE-CAPILLARY: 277 mg/dL — AB (ref 65–99)
Glucose-Capillary: 415 mg/dL — ABNORMAL HIGH (ref 65–99)
Glucose-Capillary: 292 mg/dL — ABNORMAL HIGH (ref 65–99)

## 2017-04-05 LAB — DIFFERENTIAL
BASOS ABS: 0 10*3/uL (ref 0.0–0.1)
BASOS PCT: 0 %
EOS ABS: 0 10*3/uL (ref 0.0–0.7)
Eosinophils Relative: 0 %
LYMPHS ABS: 0.8 10*3/uL (ref 0.7–4.0)
Lymphocytes Relative: 8 %
Monocytes Absolute: 0.3 10*3/uL (ref 0.1–1.0)
Monocytes Relative: 3 %
NEUTROS ABS: 8.4 10*3/uL — AB (ref 1.7–7.7)
NEUTROS PCT: 89 %

## 2017-04-05 LAB — CBC
HCT: 47.6 % (ref 39.0–52.0)
Hemoglobin: 16.4 g/dL (ref 13.0–17.0)
MCH: 28.4 pg (ref 26.0–34.0)
MCHC: 34.5 g/dL (ref 30.0–36.0)
MCV: 82.4 fL (ref 78.0–100.0)
PLATELETS: 215 10*3/uL (ref 150–400)
RBC: 5.78 MIL/uL (ref 4.22–5.81)
RDW: 14.1 % (ref 11.5–15.5)
WBC: 10.6 10*3/uL — AB (ref 4.0–10.5)

## 2017-04-05 LAB — LIPID PANEL
CHOLESTEROL: 243 mg/dL — AB (ref 0–200)
HDL: 37 mg/dL — ABNORMAL LOW (ref >40)
LDL Cholesterol: 191 mg/dL — ABNORMAL HIGH (ref 0–99)
Total CHOL/HDL Ratio: 6.6 RATIO
Triglycerides: 74 mg/dL (ref <150)
VLDL: 15 mg/dL (ref 0–40)

## 2017-04-05 LAB — HEMOGLOBIN A1C
HEMOGLOBIN A1C: 12.5 % — AB (ref 4.8–5.6)
HEMOGLOBIN A1C: 12.5 % — AB (ref 4.8–5.6)
MEAN PLASMA GLUCOSE: 312.05 mg/dL
Mean Plasma Glucose: 312.05 mg/dL

## 2017-04-05 LAB — BASIC METABOLIC PANEL
Anion gap: 20 — ABNORMAL HIGH (ref 5–15)
Anion gap: 14 (ref 5–15)
BUN: 15 mg/dL (ref 6–20)
BUN: 13 mg/dL (ref 6–20)
CALCIUM: 9.6 mg/dL (ref 8.9–10.3)
CHLORIDE: 103 mmol/L (ref 101–111)
CO2: 16 mmol/L — ABNORMAL LOW (ref 22–32)
CO2: 21 mmol/L — AB (ref 22–32)
CREATININE: 0.83 mg/dL (ref 0.61–1.24)
Calcium: 9.3 mg/dL (ref 8.9–10.3)
Chloride: 100 mmol/L — ABNORMAL LOW (ref 101–111)
Creatinine, Ser: 0.66 mg/dL (ref 0.61–1.24)
GFR calc Af Amer: >60 mL/min (ref >60)
GFR calc non Af Amer: >60 mL/min (ref >60)
GLUCOSE: 422 mg/dL — AB (ref 65–99)
GLUCOSE: 223 mg/dL — AB (ref 65–99)
POTASSIUM: 4.1 mmol/L (ref 3.5–5.1)
Potassium: 3.7 mmol/L (ref 3.5–5.1)
SODIUM: 136 mmol/L (ref 135–145)
Sodium: 138 mmol/L (ref 135–145)

## 2017-04-05 LAB — I-STAT CHEM 8, ED
BUN: 16 mg/dL (ref 6–20)
CHLORIDE: 101 mmol/L (ref 101–111)
CREATININE: 0.5 mg/dL — AB (ref 0.61–1.24)
Calcium, Ion: 1.14 mmol/L — ABNORMAL LOW (ref 1.15–1.40)
Glucose, Bld: 407 mg/dL — ABNORMAL HIGH (ref 65–99)
HCT: 47 % (ref 39.0–52.0)
Hemoglobin: 16 g/dL (ref 13.0–17.0)
Potassium: 4.8 mmol/L (ref 3.5–5.1)
SODIUM: 137 mmol/L (ref 135–145)
TCO2: 20 mmol/L — ABNORMAL LOW (ref 22–32)

## 2017-04-05 LAB — ETHANOL: Alcohol, Ethyl (B): <10 mg/dL (ref <10)

## 2017-04-05 LAB — I-STAT CG4 LACTIC ACID, ED
Lactic Acid, Venous: 2.46 mmol/L (ref 0.5–1.9)
Lactic Acid, Venous: 2.02 mmol/L (ref 0.5–1.9)

## 2017-04-05 LAB — I-STAT TROPONIN, ED: Troponin i, poc: 0.02 ng/mL (ref 0.00–0.08)

## 2017-04-05 LAB — RAPID URINE DRUG SCREEN, HOSP PERFORMED
AMPHETAMINES: NOT DETECTED
BENZODIAZEPINES: NOT DETECTED
Barbiturates: NOT DETECTED
COCAINE: NOT DETECTED
OPIATES: NOT DETECTED
TETRAHYDROCANNABINOL: POSITIVE — AB

## 2017-04-05 LAB — LIPASE, BLOOD: Lipase: 25 U/L (ref 11–51)

## 2017-04-05 LAB — TSH: TSH: 1.29 u[IU]/mL (ref 0.350–4.500)

## 2017-04-05 LAB — APTT: aPTT: 25 seconds (ref 24–36)

## 2017-04-05 LAB — PROTIME-INR
INR: 1.02
PROTHROMBIN TIME: 13.3 s (ref 11.4–15.2)

## 2017-04-05 LAB — GLUCOSE, CAPILLARY
GLUCOSE-CAPILLARY: 253 mg/dL — AB (ref 65–99)
GLUCOSE-CAPILLARY: 188 mg/dL — AB (ref 65–99)
Glucose-Capillary: 243 mg/dL — ABNORMAL HIGH (ref 65–99)
Glucose-Capillary: 188 mg/dL — ABNORMAL HIGH (ref 65–99)

## 2017-04-05 MED ORDER — SODIUM CHLORIDE 0.9 % IV BOLUS (SEPSIS)
1000.0000 mL | Freq: Once | INTRAVENOUS | Status: AC
  Administered 2017-04-05: 1000 mL via INTRAVENOUS

## 2017-04-05 MED ORDER — SODIUM CHLORIDE 0.9 % IV SOLN
INTRAVENOUS | Status: DC
  Administered 2017-04-05: 17:00:00 via INTRAVENOUS

## 2017-04-05 MED ORDER — SODIUM CHLORIDE 0.9 % IV SOLN
INTRAVENOUS | Status: DC
  Administered 2017-04-05: 3.5 [IU]/h via INTRAVENOUS
  Filled 2017-04-05 (×2): qty 1

## 2017-04-05 MED ORDER — ENOXAPARIN SODIUM 40 MG/0.4ML ~~LOC~~ SOLN
40.0000 mg | SUBCUTANEOUS | Status: DC
  Filled 2017-04-05: qty 0.4

## 2017-04-05 MED ORDER — FENTANYL CITRATE (PF) 100 MCG/2ML IJ SOLN: 100.0000 ug | INTRAMUSCULAR | Status: DC | PRN

## 2017-04-05 MED ORDER — KCL IN DEXTROSE-NACL 20-5-0.9 MEQ/L-%-% IV SOLN
INTRAVENOUS | Status: DC
  Filled 2017-04-05: qty 1000

## 2017-04-05 MED ORDER — KCL IN DEXTROSE-NACL 20-5-0.45 MEQ/L-%-% IV SOLN
INTRAVENOUS | Status: DC
  Filled 2017-04-05: qty 1000

## 2017-04-05 MED ORDER — DEXTROSE-NACL 5-0.45 % IV SOLN
INTRAVENOUS | Status: DC
  Administered 2017-04-05: 22:00:00 via INTRAVENOUS

## 2017-04-05 MED ORDER — DIAZEPAM 2 MG PO TABS
2.0000 mg | ORAL_TABLET | Freq: Once | ORAL | Status: AC
  Administered 2017-04-05: 2 mg via ORAL
  Filled 2017-04-05: qty 1

## 2017-04-05 MED ORDER — MECLIZINE HCL 25 MG PO TABS
25.0000 mg | ORAL_TABLET | Freq: Once | ORAL | Status: AC
  Administered 2017-04-05: 25 mg via ORAL
  Filled 2017-04-05: qty 1

## 2017-04-05 NOTE — ED Triage Notes (Signed)
Per Pt and family, Pt is coming from home with generalized not feeling well that started yesterday. Pt was reported to have some nausea and vomiting that started MOnday. Pt reports that he felt better on Tuesday and Wednesday. Last night, pt reports diaphoresis, vomiting, generalized weakness, slurred speech.

## 2017-04-05 NOTE — ED Notes (Signed)
CBG: 410. RN notified.

## 2017-04-05 NOTE — H&P (Signed)
Date: 04/05/2017               Patient Name:  John Roman MRN: [id number]  DOB: November 27, 1955 Age / Sex: 62 y.o., male   PCP: Patient, No Pcp Per         Medical Service: Internal Medicine Teaching Service         Attending Physician: Dr. Levert Feinstein, MD    First Contact: Dr. Minda Meo Pager: [phone]  Second Contact: Dr. Vincente Liberty Pager: [phone]       After Hours (After 5p/  First Contact Pager: [phone]  weekends / holidays): Second Contact Pager: [phone]   Chief Complaint: Dizziness, nausea, vomiting  History of Present Illness:  62 yo male PMH of Type 2 diabetes mellitus, HLD, HTN presenting with dizziness, frequent falls, weakness, and nausea. Patient is a poor historian and history was limited. Patient endorses flu-like symptoms 4 days ago, which improved within several days, and he was in his normal state of health yesterday. State of health changed when he ate a large piece of cake, as well as [place] and chicken dumplings, and his blood sugar became elevated due to that. Patient attributes all of his symptoms to elevated BG. Symptoms of dizziness and sensation of room spinning had subsided when evaluated by IMTS in the ED.  He had acute onset dizziness and vertigo like symptoms late last night, which did not subside this morning. Patient states the room was spinning and he fell several times because "his legs kept coming out from under him." Patient denies slurred speech, facial droop, or focal weakness.   Patient does not take medications for his DM and has managed his diabetes with diet and weight loss. He states he lost 100 pounds several years ago and was able to come off of metformin.    ED Course: Vitals: Blood pressure (!) 170/83, pulse (!) 57, temperature 98.3 F (36.8 C), resp. rate 17, SpO2 94 %. Labs: Na 136, K 4.1, Cr 0.83, BG 422, LA 2.46, AG 20; CBC with WBC 10.6, otherwise WNL Meds: Valium, Meclizine; starting on Glucosestabilizer-inuslin gtt, 3 L  NS Imaging:  CT Head: Prior infarcts in the mid superior, midportion of the right occipital lobe extending to the parietooccipital junction on the right as well as at the right frontal-parietal junction. There is mild periventricular small vessel disease. No evident acute infarct. No mass or hemorrhage.There are foci of arterial vascular calcification. Consults: Neurology  Meds:  No outpatient medications have been marked as taking for the 04/05/17 encounter [facility] Encounter).     Allergies: Allergies as of 04/05/2017  . (No Known Allergies)   Past Medical History:  Diagnosis Date  . ANXIETY 07/04/2007  . ASTHMATIC BRONCHITIS, ACUTE 12/19/2007  . DIABETES MELLITUS, TYPE II 09/28/2006  . ERECTILE DYSFUNCTION 09/28/2006  . GERD 09/28/2006  . Hematochezia 05/29/2010  . HYPERLIPIDEMIA 09/28/2006  . HYPERTENSION 09/28/2006  . Illiterate 09/11/2012  . LOW BACK PAIN 09/28/2006  . OBESITY 09/28/2006  . PLANTAR FASCIITIS, BILATERAL 07/04/2007    Family History:  Family History  Problem Relation Age of Onset  . Colon polyps Sister   . Coronary artery disease Brother   . Heart attack Brother   . Diabetes Brother        2 brothers   . Hypertension Brother     Social History:  Social History   Tobacco Use  . Smoking status: Former Games developer  . Smokeless tobacco: Never Used  Substance Use Topics  .  Alcohol use: No  . Drug use: Yes    Types: Marijuana    Comment: occasional marijanua use    Review of Systems A complete ROS was negative except as per HPI.   Physical Exam: Blood pressure (!) 166/84, pulse (!) 59, temperature 98.3 F (36.8 C), temperature source Oral, resp. rate 18, height 5' 9.5" (1.765 m), weight 200 lb (90.7 kg), SpO2 99 %. Physical Exam  Constitutional: He is oriented to person, place, and time. He appears well-developed and well-nourished. No distress.  HENT:  Head: Normocephalic and atraumatic.  Mouth/Throat: Mucous membranes are dry.  Eyes: Conjunctivae and  lids are normal. Pupils are equal, round, and reactive to light. Right eye exhibits nystagmus. Left eye exhibits nystagmus.  Neck: Normal range of motion. Neck supple.  Cardiovascular: Normal rate, regular rhythm and normal heart sounds.  Pulmonary/Chest: Effort normal and breath sounds normal. No respiratory distress. He has no wheezes.  Abdominal: Soft. Bowel sounds are normal. There is no tenderness.  Neurological: He is alert and oriented to person, place, and time. He has normal strength. A cranial nerve deficit (mild right sided facial droop) is present. No sensory deficit.  Skin: Skin is warm and dry.    EKG: personally reviewed my interpretation is sinus bradycardia  CXR: personally reviewed my interpretation negative for focal opacity, edema, or active cardiopulmonary process  Assessment & Plan by Problem: Active Problems:   DKA, type 2 (HCC)  DKA Type 2 DM Patient presenting with LA 2.46, AG 20 (AG 25 if correct Na for BG), BG in 400s, with + urinary ketones consistent with DKA.  -Insulin gtt -CBG monitoring q1 hours -BMET every 4 hours -Transition to SQ insulin once anion gap closed x 2 and BG <250  -IVF: 150 cc/hr NS to change to D5 1/2 NS when CBG < 250 -Zofran PRN for nausea -consult placed to diabetes coordinator  -Diet: NPO can advance once BG <250 and after patient has MRI  Ataxia, Dizziness, Nystagmus CT head with evidence of multiple prior strokes. Neuro exam with mild right sided facial droop and nystagmus. Strength intact in upper and lower extremities bilaterally.  Passed bedside speech and swallow in ED. MRI delayed due to patient reporting metal in one of his arms.  -Neuro consulted, appreciate recs -MRI of brain and MRA Head and neck pending  -NPO until MRI complete  HTN Hypertensive on admission. Not currently on any medications at home.  -will allow for permissive HTN  Code Status: DNR confirmed on admission  Dispo: Admit patient to Inpatient with  expected length of stay greater than 2 midnights.  Signed: Toney RakesLacroce, Jaselyn Nahm J, MD 04/05/2017, 6:27 PM  Pager: [phone]

## 2017-04-05 NOTE — Progress Notes (Addendum)
22:17-Pt continues to refuse several aspects of care, becoming aggressive and verbally abusive to staff members. Pt extremely upset that MRI has not been completed at this time. Pt's son, Ramon Dredgedward, called bedside nurse via telephone in aggressive manner, stating that "I need answers of when scan is going to be done and when my dad can eat". Pt and family updated on plan of care, however, pt's son continued to become increasingly aggressive using profanity and calling staff members  "liars" because the MRI was to be completed earlier this evening.    2330-Pt's son arrived to unit demanding that pt be able to eat and accusing staff of "neglecting people". Pt verbally abusive toward several staff members. Security called and pt's son, Ramon Dredgedward, escorted off of exit via Engineer, materialssecurity officer x 2.
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
## 2017-04-05 NOTE — Consult Note (Addendum)
Requesting Physician: Dr. Vinnie Level    Chief Complaint: Vertigo, abnormal gait, nausea vomiting  History obtained from:  Patient    HPI:                                                                                                                                         John Roman is an 61 y.o. male with hypertension, diabetes, hyperlipidemia.  States that this past Tuesday he had an episode of vertigo nausea vomiting however this subsided and thus he felt it was just flu.  However last night patient went to bed at approximately 2300 hrs. and felt fine.  He got up at 0100 hrs. and attempted to go to the bathroom but Leaning to the side in fact fell to the floor.  Patient felt his room spinning but cannot tell me which way it was going.  In addition to this he was nauseous and did vomit.  Patient was brought to the emergency department where he was found in DKA and stroke was suspected.  CT of head was obtained and did not show any intracranial abnormalities.  However exam still shows abnormalities that are indicative of stroke.  Patient was not a TPA candidate as he was out of the window and this does not appear to be a large vessel occlusion and thus he is not a interventional radiology.  Date last known well: Date: 04/04/2017 Time last known well: Time: 22:00 tPA Given: No: Out of the window Modified Rankin: Rankin Score=0    Past Medical History:  Diagnosis Date  . ANXIETY 07/04/2007  . ASTHMATIC BRONCHITIS, ACUTE 12/19/2007  . DIABETES MELLITUS, TYPE II 09/28/2006  . ERECTILE DYSFUNCTION 09/28/2006  . GERD 09/28/2006  . Hematochezia 05/29/2010  . HYPERLIPIDEMIA 09/28/2006  . HYPERTENSION 09/28/2006  . Illiterate 09/11/2012  . LOW BACK PAIN 09/28/2006  . OBESITY 09/28/2006  . PLANTAR FASCIITIS, BILATERAL 07/04/2007    Past Surgical History:  Procedure Laterality Date  . APPENDECTOMY      Family History  Problem Relation Age of Onset  . Colon polyps Sister   . Coronary artery disease  Brother   . Heart attack Brother   . Diabetes Brother        2 brothers   . Hypertension Brother    Social History:  reports that he has quit smoking. he has never used smokeless tobacco. He reports that he uses drugs. Drug: Marijuana. He reports that he does not drink alcohol.  Allergies: No Known Allergies  Medications:  Current Facility-Administered Medications  Medication Dose Route Frequency Provider Last Rate Last Dose  . sodium chloride 0.9 % bolus 1,000 mL  1,000 mL Intravenous Once Arthor Captain, PA-C 1,000 mL/hr at 04/05/17 1438 1,000 mL at 04/05/17 1438   And  . sodium chloride 0.9 % bolus 1,000 mL  1,000 mL Intravenous Once Arthor Captain, PA-C 1,000 mL/hr at 04/05/17 1428 1,000 mL at 04/05/17 1428   And  . 0.9 %  sodium chloride infusion   Intravenous Continuous Harris, Abigail, PA-C      . dextrose 5 %-0.45 % sodium chloride infusion   Intravenous Continuous Arthor Captain, PA-C   Stopped at 04/05/17 1412  . insulin regular (NOVOLIN R,HUMULIN R) 100 Units in sodium chloride 0.9 % 100 mL (1 Units/mL) infusion   Intravenous Continuous Arthor Captain, PA-C   Stopped at 04/05/17 1438   Current Outpatient Medications  Medication Sig Dispense Refill  . mupirocin cream (BACTROBAN) 2 % Apply 1 application topically 3 (three) times daily.    Marland Kitchen lovastatin (MEVACOR) 40 MG tablet Take 1 tablet (40 mg total) by mouth daily. 90 tablet 3     ROS:                                                                                                                                       History obtained from the patient  General ROS: negative for - chills, fatigue, fever, night sweats, weight gain or weight loss Psychological ROS: negative for - , hallucinations, memory difficulties, mood swings or  Ophthalmic ROS: negative for - blurry vision, double vision, eye pain  or loss of vision ENT ROS: Positive for -  vertigo Respiratory ROS: negative for - cough,  shortness of breath or wheezing Cardiovascular ROS: negative for - chest pain, dyspnea on exertion,  Gastrointestinal ROS: negative for - abdominal pain, diarrhea,  nausea/vomiting or stool incontinence Genito-Urinary ROS: negative for - dysuria, hematuria, incontinence or urinary frequency/urgency Musculoskeletal ROS: negative for - joint swelling or muscular weakness Neurological ROS: as noted in HPI   General Examination:                                                                                                      Blood pressure (!) 186/80, pulse (!) 53, temperature 98.3 F (36.8 C), temperature source Oral, resp. rate 17, height 5' 9.5" (1.765 m), weight 90.7 kg (200 lb), SpO2 94 %.  HEENT-  Normocephalic, no lesions, without obvious abnormality.  Normal external eye  and conjunctiva.   Cardiovascular- S1-S2 audible, pulses palpable throughout   Lungs-no rhonchi or wheezing noted, no excessive working breathing.  Saturations within normal limits Abdomen- All 4 quadrants palpated and nontender Extremities- Warm, dry and intact Musculoskeletal-no joint tenderness, deformity or swelling Skin-warm and dry, no hyperpigmentation, vitiligo, or suspicious lesions  Neurological Examination Mental Status: Alert, oriented, thought content appropriate.  Speech dysarthric without evidence of aphasia.  Able to follow 3 step commands without difficulty. Cranial Nerves: II:  Visual fields grossly normal,  III,IV, VI: ptosis not present, extra-ocular motions intact bilaterally, pupils equal, round, reactive to light and accommodation, saccadic eye movement with possible rotary nystagmus V,VII: smile asymmetric on the right however he is a edentulous, facial light touch sensation normal bilaterally VIII: hearing normal bilaterally IX,X: uvula rises symmetrically XI: bilateral shoulder shrug XII: midline  tongue extension Motor: Right : Upper extremity   5/5    Left:     Upper extremity   5/5  Lower extremity   5/5     Lower extremity   5/5 Tone and bulk:normal tone throughout; no atrophy noted Sensory: Pinprick and light touch intact throughout, bilaterally Deep Tendon Reflexes: 2+ and symmetric throughout Plantars: Right: downgoing   Left: downgoing Cerebellar: normal finger-to-nose, dysmetric rapid alternating movements the right hand and dysmetric  heel-to-shin test on the right Gait: normal gait and station   Lab Results: Basic Metabolic Panel: Recent Labs  Lab 04/05/17 1059 04/05/17 1220  NA 136 137  K 4.1 4.8  CL 100* 101  CO2 16*  --   GLUCOSE 422* 407*  BUN 15 16  CREATININE 0.83 0.50*  CALCIUM 9.6  --     CBC: Recent Labs  Lab 04/05/17 1059 04/05/17 1220  WBC 10.6*  --   NEUTROABS 8.4*  --   HGB 16.4 16.0  HCT 47.6 47.0  MCV 82.4  --   PLT 215  --     Lipid Panel: No results for input(s): CHOL, TRIG, HDL, CHOLHDL, VLDL, LDLCALC in the last 168 hours.  CBG: Recent Labs  Lab 04/05/17 1052 04/05/17 1405  GLUCAP 415* 410*    Imaging: Ct Head Wo Contrast  Result Date: 04/05/2017 CLINICAL DATA:  Fall earlier today.  Vertigo. EXAM: CT HEAD WITHOUT CONTRAST TECHNIQUE: Contiguous axial images were obtained from the base of the skull through the vertex without intravenous contrast. COMPARISON:  None. FINDINGS: Brain: The ventricles are normal in size and configuration. There is no intracranial mass, hemorrhage, extra-axial fluid collection, or midline shift. There is evidence of a prior infarct in the midportion of the superior right occipital lobe extending to the right parietooccipital junction. There is evidence of a smaller prior infarct at the right frontal-parietal junction. There is slight small vessel disease in the centra semiovale bilaterally. No acute infarct evident. Vascular: No hyperdense vessel. There is calcification in each carotid siphon region.  Skull: The bony calvarium appears intact. Sinuses/Orbits: There is mucosal thickening in several ethmoid air cells bilaterally. Other paranasal sinuses are clear. There is leftward deviation of the nasal septum. Orbits appear symmetric bilaterally. Other: Mastoid air cells are clear. IMPRESSION: Prior infarcts in the mid superior, midportion of the right occipital lobe extending to the parietooccipital junction on the right as well as at the right frontal-parietal junction. There is mild periventricular small vessel disease. No evident acute infarct. No mass or hemorrhage. There are foci of arterial vascular calcification. There is mucosal thickening in several ethmoid air cells. There is leftward deviation of  the nasal septum. Electronically Signed   By: Bretta Bang III M.D.   On: 04/05/2017 12:26    Assessment and plan discussed with with attending physician and they are in agreement.    Felicie Morn PA-C [facility] [phone]  04/05/2017, 2:35 PM   Assessment: 62 y.o. male presenting with sudden onset of gait disturbance with inability to keep himself from falling to the right, right facial droop, dysarthria, nausea vomiting and vertigo.  Exam is positive for right facial droop, dysmetria of right leg, and dysarthria.  Stroke Risk Factors - diabetes mellitus, hyperlipidemia and hypertension--uncontrolled diabetes  Recommend 1. HgbA1c, fasting lipid panel 2. MRI/MRA of the brain without contrast 3. PT consult, OT consult, Speech consult 4. Echocardiogram 5. 80 mg of Atorvistatin 6. Prophylactic therapy-Antiplatelet med: Aspirin 325 mg daily 7. Risk factor modification 8. Telemetry monitoring 9. Frequent neuro checks 10 NPO until passes stroke swallow screen 11 please page stroke NP  Or  PA  Or MD from 8am -4 pm  as this patient from this time will be  followed by the stroke.   You can look them up on [email or web address]  Password [email or web address]  NEUROHOSPITALIST ADDENDUM  Formulated  plan as documented above by PAC/Resident. I agree with recommendations as above. Will follow.  Georgiana Spinner Lucas Exline MD [facility] [phone]  If 7pm to 7am, please call on call as listed on AMION.

## 2017-04-06 ENCOUNTER — Inpatient Hospital Stay (HOSPITAL_COMMUNITY): Payer: BLUE CROSS/BLUE SHIELD

## 2017-04-06 ENCOUNTER — Inpatient Hospital Stay (HOSPITAL_BASED_OUTPATIENT_CLINIC_OR_DEPARTMENT_OTHER): Payer: BLUE CROSS/BLUE SHIELD

## 2017-04-06 ENCOUNTER — Other Ambulatory Visit: Payer: Self-pay

## 2017-04-06 DIAGNOSIS — I63531 Cerebral infarction due to unspecified occlusion or stenosis of right posterior cerebral artery: Secondary | ICD-10-CM

## 2017-04-06 DIAGNOSIS — E1110 Type 2 diabetes mellitus with ketoacidosis without coma: Secondary | ICD-10-CM | POA: Diagnosis not present

## 2017-04-06 DIAGNOSIS — I10 Essential (primary) hypertension: Secondary | ICD-10-CM

## 2017-04-06 DIAGNOSIS — Z9119 Patient's noncompliance with other medical treatment and regimen: Secondary | ICD-10-CM

## 2017-04-06 DIAGNOSIS — I639 Cerebral infarction, unspecified: Secondary | ICD-10-CM

## 2017-04-06 DIAGNOSIS — I6501 Occlusion and stenosis of right vertebral artery: Secondary | ICD-10-CM | POA: Diagnosis not present

## 2017-04-06 DIAGNOSIS — R42 Dizziness and giddiness: Secondary | ICD-10-CM | POA: Diagnosis not present

## 2017-04-06 DIAGNOSIS — R29810 Facial weakness: Secondary | ICD-10-CM

## 2017-04-06 DIAGNOSIS — I6300 Cerebral infarction due to thrombosis of unspecified precerebral artery: Secondary | ICD-10-CM

## 2017-04-06 LAB — BASIC METABOLIC PANEL
ANION GAP: 9 (ref 5–15)
Anion gap: 11 (ref 5–15)
Anion gap: 12 (ref 5–15)
BUN: 13 mg/dL (ref 6–20)
BUN: 13 mg/dL (ref 6–20)
BUN: 14 mg/dL (ref 6–20)
CALCIUM: 9.6 mg/dL (ref 8.9–10.3)
CHLORIDE: 104 mmol/L (ref 101–111)
CHLORIDE: 100 mmol/L — AB (ref 101–111)
CO2: 25 mmol/L (ref 22–32)
CO2: 20 mmol/L — AB (ref 22–32)
CO2: 22 mmol/L (ref 22–32)
CREATININE: 0.63 mg/dL (ref 0.61–1.24)
CREATININE: 0.72 mg/dL (ref 0.61–1.24)
Calcium: 9.4 mg/dL (ref 8.9–10.3)
Calcium: 9.1 mg/dL (ref 8.9–10.3)
Chloride: 103 mmol/L (ref 101–111)
Creatinine, Ser: 0.68 mg/dL (ref 0.61–1.24)
GFR calc Af Amer: >60 mL/min (ref >60)
GFR calc Af Amer: >60 mL/min (ref >60)
GFR calc Af Amer: >60 mL/min (ref >60)
GFR calc non Af Amer: >60 mL/min (ref >60)
GFR calc non Af Amer: >60 mL/min (ref >60)
GLUCOSE: 271 mg/dL — AB (ref 65–99)
Glucose, Bld: 164 mg/dL — ABNORMAL HIGH (ref 65–99)
Glucose, Bld: 175 mg/dL — ABNORMAL HIGH (ref 65–99)
Potassium: 4.2 mmol/L (ref 3.5–5.1)
Potassium: 3.2 mmol/L — ABNORMAL LOW (ref 3.5–5.1)
Potassium: 3.6 mmol/L (ref 3.5–5.1)
SODIUM: 132 mmol/L — AB (ref 135–145)
SODIUM: 137 mmol/L (ref 135–145)
SODIUM: 137 mmol/L (ref 135–145)

## 2017-04-06 LAB — LIPID PANEL
CHOL/HDL RATIO: 6.9 ratio
Cholesterol: 227 mg/dL — ABNORMAL HIGH (ref 0–200)
HDL: 33 mg/dL — AB (ref >40)
LDL CALC: 171 mg/dL — AB (ref 0–99)
Triglycerides: 113 mg/dL (ref <150)
VLDL: 23 mg/dL (ref 0–40)

## 2017-04-06 LAB — CBC
HEMATOCRIT: 45.7 % (ref 39.0–52.0)
Hemoglobin: 15.7 g/dL (ref 13.0–17.0)
MCH: 28.5 pg (ref 26.0–34.0)
MCHC: 34.4 g/dL (ref 30.0–36.0)
MCV: 82.9 fL (ref 78.0–100.0)
Platelets: 193 10*3/uL (ref 150–400)
RBC: 5.51 MIL/uL (ref 4.22–5.81)
RDW: 14 % (ref 11.5–15.5)
WBC: 10.3 10*3/uL (ref 4.0–10.5)

## 2017-04-06 LAB — GLUCOSE, CAPILLARY
GLUCOSE-CAPILLARY: 266 mg/dL — AB (ref 65–99)
GLUCOSE-CAPILLARY: 279 mg/dL — AB (ref 65–99)
Glucose-Capillary: 151 mg/dL — ABNORMAL HIGH (ref 65–99)
Glucose-Capillary: 178 mg/dL — ABNORMAL HIGH (ref 65–99)
Glucose-Capillary: 211 mg/dL — ABNORMAL HIGH (ref 65–99)
Glucose-Capillary: 254 mg/dL — ABNORMAL HIGH (ref 65–99)

## 2017-04-06 LAB — MRSA PCR SCREENING: MRSA by PCR: NEGATIVE

## 2017-04-06 LAB — ECHOCARDIOGRAM COMPLETE
Height: 69.5 in
Weight: 3200 oz

## 2017-04-06 LAB — HIV ANTIBODY (ROUTINE TESTING W REFLEX): HIV SCREEN 4TH GENERATION: NONREACTIVE

## 2017-04-06 MED ORDER — ASPIRIN 325 MG PO TABS
325.0000 mg | ORAL_TABLET | Freq: Every day | ORAL | Status: DC
  Administered 2017-04-06: 325 mg via ORAL
  Filled 2017-04-06: qty 1

## 2017-04-06 MED ORDER — INSULIN GLARGINE 100 UNIT/ML ~~LOC~~ SOLN
15.0000 [IU] | Freq: Every day | SUBCUTANEOUS | Status: DC
  Administered 2017-04-06: 15 [IU] via SUBCUTANEOUS
  Filled 2017-04-06 (×2): qty 0.15

## 2017-04-06 MED ORDER — ASPIRIN 325 MG PO TABS: 325.0000 mg | ORAL_TABLET | Freq: Every day | ORAL | 0 refills | Status: DC

## 2017-04-06 MED ORDER — GADOBENATE DIMEGLUMINE 529 MG/ML IV SOLN
18.0000 mL | Freq: Once | INTRAVENOUS | Status: AC | PRN
Start: 2017-04-06 — End: 2017-04-06
  Administered 2017-04-06: 18 mL via INTRAVENOUS

## 2017-04-06 MED ORDER — INSULIN ASPART 100 UNIT/ML ~~LOC~~ SOLN
0.0000 [IU] | Freq: Three times a day (TID) | SUBCUTANEOUS | Status: DC
  Administered 2017-04-06 (×3): 8 [IU] via SUBCUTANEOUS

## 2017-04-06 MED ORDER — METFORMIN HCL 500 MG PO TABS: 500.0000 mg | ORAL_TABLET | Freq: Every day | ORAL | 11 refills | Status: DC

## 2017-04-06 MED ORDER — ATORVASTATIN CALCIUM 80 MG PO TABS
80.0000 mg | ORAL_TABLET | Freq: Every day | ORAL | Status: DC
  Administered 2017-04-06: 80 mg via ORAL
  Filled 2017-04-06: qty 1

## 2017-04-06 MED ORDER — ASPIRIN 81 MG PO TABS: 81.0000 mg | ORAL_TABLET | Freq: Every day | ORAL | 0 refills | Status: DC

## 2017-04-06 MED ORDER — ATORVASTATIN CALCIUM 80 MG PO TABS: 80.0000 mg | ORAL_TABLET | Freq: Every day | ORAL | 0 refills | Status: DC

## 2017-04-06 MED ORDER — POTASSIUM CHLORIDE 10 MEQ/100ML IV SOLN
10.0000 meq | INTRAVENOUS | Status: AC
  Administered 2017-04-06 (×2): 10 meq via INTRAVENOUS
  Filled 2017-04-06 (×2): qty 100

## 2017-04-06 MED ORDER — INSULIN GLARGINE 100 UNIT/ML ~~LOC~~ SOLN: 15.0000 [IU] | Freq: Every day | SUBCUTANEOUS | Status: DC

## 2017-04-06 MED ORDER — INSULIN GLARGINE 100 UNIT/ML ~~LOC~~ SOLN: 15.0000 [IU] | Freq: Every day | SUBCUTANEOUS | 1 refills | Status: DC

## 2017-04-06 MED ORDER — CLOPIDOGREL BISULFATE 75 MG PO TABS: 75.0000 mg | ORAL_TABLET | Freq: Every day | ORAL | 0 refills | Status: DC

## 2017-04-06 MED ORDER — BLOOD GLUCOSE MONITOR KIT: PACK | 0 refills | Status: DC

## 2017-04-06 MED ORDER — INSULIN GLARGINE 100 UNIT/ML ~~LOC~~ SOLN
10.0000 [IU] | Freq: Every day | SUBCUTANEOUS | Status: DC
  Filled 2017-04-06: qty 0.1

## 2017-04-06 MED ORDER — INSULIN ASPART 100 UNIT/ML ~~LOC~~ SOLN: 0.0000 [IU] | Freq: Every day | SUBCUTANEOUS | Status: DC

## 2017-04-06 NOTE — Evaluation (Signed)
Physical Therapy Evaluation Patient Details Name: John Roman MRN: [id number] DOB: December 08, 1955 Today's Date: 04/06/2017   History of Present Illness  62 yo male PMH of Type 2 diabetes mellitus, HLD, HTN presenting with dizziness, frequent falls, weakness, and nausea. MRI revealed R PICA infarct.   Clinical Impression  Pt admitted with above diagnosis. Pt currently with functional limitations due to the deficits listed below (see PT Problem List). PTA pt lived at home with his wife. He was independent and active. On eval, pt required min assist transfers and mod assist ambulation 15 feet. He demonstrates significant balance and proprioceptive deficits, falling toward right. He is impulsive and very angry. Unable to attempt hallway ambulation due to safety. Pt unwilling to follow direction and his anger escalates with therapist's attempts to address/correct. Pt will benefit from skilled PT to increase their independence and safety with mobility to allow discharge to the venue listed below.  Pt is refusing CIR. He is adamant about returning home. Pt's wife and son present in room. Explained to family that pt is at high risk for falls. If pt continues to decline inpatient rehab program, recommend HHPT.     Follow Up Recommendations CIR    Equipment Recommendations  Other (comment)(TBD)    Recommendations for Other Services Rehab consult     Precautions / Restrictions Precautions Precautions: Fall      Mobility  Bed Mobility Overal bed mobility: Modified Independent                Transfers Overall transfer level: Needs assistance Equipment used: None Transfers: Sit to/from Stand Sit to Stand: Min assist         General transfer comment: assist to stabilize initial standing balance  Ambulation/Gait Ambulation/Gait assistance: Mod assist Ambulation Distance (Feet): 15 Feet   Gait Pattern/deviations: Step-through pattern;Ataxic;Staggering right     General Gait Details:  Very unsteady. Decreased proprioception and coordination RLE. Pt very impulsive and angry. Unwilling to follow direction requiring return to bed for pt/therapist safety.   Stairs            Wheelchair Mobility    Modified Rankin (Stroke Patients Only) Modified Rankin (Stroke Patients Only) Pre-Morbid Rankin Score: No symptoms Modified Rankin: Moderate disability     Balance Overall balance assessment: Needs assistance Sitting-balance support: No upper extremity supported;Feet supported Sitting balance-Leahy Scale: Good     Standing balance support: Single extremity supported;During functional activity Standing balance-Leahy Scale: Poor                               Pertinent Vitals/Pain Pain Assessment: No/denies pain    Home Living Family/patient expects to be discharged to:: Private residence Living Arrangements: Spouse/significant other Available Help at Discharge: Family;Available 24 hours/day Type of Home: House Home Access: Stairs to enter   [employer] of Steps: 4 Home Layout: Two level;Bed/bath upstairs Home Equipment: None      Prior Function Level of Independence: Independent               Hand Dominance        Extremity/Trunk Assessment   Upper Extremity Assessment Upper Extremity Assessment: Overall WFL for tasks assessed    Lower Extremity Assessment Lower Extremity Assessment: Overall WFL for tasks assessed    Cervical / Trunk Assessment Cervical / Trunk Assessment: Normal  Communication   Communication: No difficulties  Cognition Arousal/Alertness: Awake/alert Behavior During Therapy: Agitated;Impulsive Overall Cognitive Status: Within Functional Limits for  tasks assessed                                 General Comments: Pt very angry. Cursing.       General Comments      Exercises     Assessment/Plan    PT Assessment Patient needs continued PT services  PT Problem List  Decreased mobility;Decreased safety awareness;Decreased coordination;Decreased balance       PT Treatment Interventions DME instruction;Therapeutic activities;Gait training;Therapeutic exercise;Patient/family education;Balance training;Stair training;Functional mobility training;Neuromuscular re-education    PT Goals (Current goals can be found in the Care Plan section)  Acute Rehab PT Goals Patient Stated Goal: home PT Goal Formulation: With patient/family Time For Goal Achievement: 04/20/17 Potential to Achieve Goals: Good    Frequency Min 4X/week   Barriers to discharge        Co-evaluation               AM-PAC PT "6 Clicks" Daily Activity  Outcome Measure Difficulty turning over in bed (including adjusting bedclothes, sheets and blankets)?: None Difficulty moving from lying on back to sitting on the side of the bed? : None Difficulty sitting down on and standing up from a chair with arms (e.g., wheelchair, bedside commode, etc,.)?: A Little Help needed moving to and from a bed to chair (including a wheelchair)?: A Little Help needed walking in hospital room?: A Lot Help needed climbing 3-5 steps with a railing? : A Lot 6 Click Score: 18    End of Session Equipment Utilized During Treatment: Gait belt Activity Tolerance: Treatment limited secondary to agitation Patient left: in bed;with call bell/phone within reach;with bed alarm set;with family/visitor present Nurse Communication: Mobility status PT Visit Diagnosis: Unsteadiness on feet (R26.81);Ataxic gait (R26.0)    Time: [id number] PT Time Calculation (min) (ACUTE ONLY): 15 min   Charges:   PT Evaluation $PT Eval Moderate Complexity: 1 Mod     PT G Codes:        Aida RaiderWendy Verna Desrocher, PT  Office # [phone] Pager [phone]   Ilda FoilGarrow, Jaivian Battaglini Rene 04/06/2017, 4:11 PM

## 2017-04-06 NOTE — Progress Notes (Signed)
Results for John Roman, John Roman (MRN [id number]) as of 04/06/2017 01:39  Ref. Range 04/05/2017 20:52 04/05/2017 21:59 04/05/2017 23:03 04/06/2017 00:04 04/06/2017 00:07  Anion gap Latest Ref Range: 5 - 15     9    On-call IM resident notified of above results and most recent CBG. Unable to check pt's CBG, as pt is currently in MRI at this time. Per verbal order,  Plan to allow pt to eat after MRI and transition to SubQ if tolerating PO intake. Will re-check CBG and allow pt to eat once out of MRI scanner.

## 2017-04-06 NOTE — Progress Notes (Signed)
STROKE TEAM PROGRESS NOTE   HISTORY OF PRESENT ILLNESS (per record) John Roman is an 62 y.o. male with hypertension, diabetes, hyperlipidemia.  States that this past Tuesday he had an episode of vertigo nausea vomiting however this subsided and thus he felt it was just flu.  However last night patient went to bed at approximately 2300 hrs. and felt fine.  He got up at 0100 hrs. and attempted to go to the bathroom but leaning to the side in fact fell to the floor.  Patient felt his room spinning but cannot tell me which way it was going.  In addition to this he was nauseous and did vomit.  Patient was brought to the emergency department where he was found in DKA and stroke was suspected.  CT of head was obtained and did not show any intracranial abnormalities.  However exam still shows abnormalities that are indicative of stroke.  Patient was not a TPA candidate as he was out of the window and this does not appear to be a large vessel occlusion and thus he is not a interventional radiology.  Date last known well: Date: 04/04/2017 Time last known well: Time: 22:00 tPA Given: No: Out of the window Modified Rankin: Rankin Score=0     SUBJECTIVE (INTERVAL HISTORY) The patient was uncooperative with history or exam. He shouted his answers and was very belligerent. Dr. Pearlean BrownieSethi was unable to perform an exam at this time.    OBJECTIVE Temp:  [98.3 F (36.8 C)] 98.3 F (36.8 C) (03/01 1046) Pulse Rate:  [53-67] 65 (03/02 0621) Cardiac Rhythm: Normal sinus rhythm;Sinus bradycardia (03/02 0400) Resp:  [12-23] 16 (03/02 0621) BP: (155-186)/(71-94) 161/80 (03/02 0621) SpO2:  [93 %-99 %] 96 % (03/02 0621) Weight:  [200 lb (90.7 kg)] 200 lb (90.7 kg) (03/01 1047)  CBC:  Recent Labs  Lab 04/05/17 1059 04/05/17 1220 04/06/17 0004  WBC 10.6*  --  10.3  NEUTROABS 8.4*  --   --   HGB 16.4 16.0 15.7  HCT 47.6 47.0 45.7  MCV 82.4  --  82.9  PLT 215  --  193    Basic Metabolic Panel:  Recent  Labs  Lab 04/06/17 0004 04/06/17 0343  NA 137 137  K 4.2 3.2*  CL 103 104  CO2 25 22  GLUCOSE 164* 175*  BUN 13 14  CREATININE 0.68 0.63  CALCIUM 9.6 9.4    Lipid Panel:     Component Value Date/Time   CHOL 243 (H) 04/05/2017 1612   TRIG 74 04/05/2017 1612   HDL 37 (L) 04/05/2017 1612   CHOLHDL 6.6 04/05/2017 1612   VLDL 15 04/05/2017 1612   LDLCALC 191 (H) 04/05/2017 1612   HgbA1c:  Lab Results  Component Value Date   HGBA1C 12.5 (H) 04/05/2017   Urine Drug Screen:     Component Value Date/Time   LABOPIA NONE DETECTED 04/05/2017 1153   COCAINSCRNUR NONE DETECTED 04/05/2017 1153   LABBENZ NONE DETECTED 04/05/2017 1153   AMPHETMU NONE DETECTED 04/05/2017 1153   THCU POSITIVE (A) 04/05/2017 1153   LABBARB NONE DETECTED 04/05/2017 1153    Alcohol Level     Component Value Date/Time   ETH <10 04/05/2017 1153    IMAGING  Dg Chest 2 View 04/05/2017 IMPRESSION:  No active cardiopulmonary disease. No radiopaque foreign body is noted.   Ct Head Wo Contrast 04/05/2017 IMPRESSION:  Prior infarcts in the mid superior, midportion of the right occipital lobe extending to the parietooccipital junction on the  right as well as at the right frontal-parietal junction. There is mild periventricular small vessel disease. No evident acute infarct. No mass or hemorrhage. There are foci of arterial vascular calcification. There is mucosal thickening in several ethmoid air cells. There is leftward deviation of the nasal septum.   Mr Maxine Glenn Neck W Wo Contrast 04/06/2017   IMPRESSION:  1. Multifocal acute ischemia within the right cerebellum and right posterior medulla consistent with right PICA infarct. No associated hemorrhage or mass effect.  2. Focus of acute to subacute ischemia in the periatrial white matter of the right parietal lobe without hemorrhage or mass effect.  3. Occlusion of the distal right vertebral artery beginning at the proximal V3 segment, also causing occlusion of  the right posterior inferior cerebellar artery.  4. Severe short segment stenosis of the proximal right internal carotid artery, which is otherwise normal.   Dg Humerus Left 04/05/2017 IMPRESSION:  No fracture or dislocation is noted. Small metallic foreign body seen in anterior subcutaneous tissues of left upper arm.   Dg Humerus Right 04/05/2017 IMPRESSION:  Normal right humerus.  No radiopaque foreign body is noted.     Transthoracic Echocardiogram  04/06/2017 Study Conclusions - Left ventricle: The cavity size was normal. Wall thickness was   increased in a pattern of mild LVH. Systolic function was normal.   The estimated ejection fraction was in the range of 60% to 65%.   Wall motion was normal; there were no regional wall motion   abnormalities. - Aortic valve: Mildly calcified annulus. Trileaflet; mildly   thickened leaflets. Valve area (VTI): 2.12 cm^2. Valve area   (Vmax): 2.1 cm^2. Valve area (Vmean): 2.12 cm^2. - Mitral valve: Mildly calcified annulus. Mildly thickened leaflets - Left atrium: The atrium was mildly to moderately dilated. - Atrial septum: No defect or patent foramen ovale was identified.      PHYSICAL EXAM Vitals:   04/05/17 2103 04/05/17 2301 04/06/17 0257 04/06/17 0621  BP: (!) 160/71 (!) 163/81 (!) 155/86 (!) 161/80  Pulse: (!) 58 (!) 56 65 65  Resp: 15 15 (!) 23 16  Temp:      TempSrc:      SpO2: 98% 99% 93% 96%  Weight:      Height:       Patient is lying in the bed face down and refuses to cooperate for detailed exam. His awake alert but refuses to cooperate with exam and yelled out at the physician and asked him to go away.     HOME MEDICATIONS:  Medications Prior to Admission  Medication Sig Dispense Refill  . lovastatin (MEVACOR) 40 MG tablet Take 1 tablet (40 mg total) by mouth daily. 90 tablet 3  . mupirocin cream (BACTROBAN) 2 % Apply 1 application topically 3 (three) times daily.        HOSPITAL MEDICATIONS:  . aspirin  325  mg Oral Daily  . atorvastatin  80 mg Oral q1800  . enoxaparin (LOVENOX) injection  40 mg Subcutaneous Q24H  . insulin aspart  0-15 Units Subcutaneous TID WC  . insulin aspart  0-5 Units Subcutaneous QHS  . insulin glargine  15 Units Subcutaneous QHS    ALLERGIES No Known Allergies  PAST MEDICAL HISTORY Past Medical History:  Diagnosis Date  . ANXIETY 07/04/2007  . ASTHMATIC BRONCHITIS, ACUTE 12/19/2007  . DIABETES MELLITUS, TYPE II 09/28/2006  . ERECTILE DYSFUNCTION 09/28/2006  . GERD 09/28/2006  . Hematochezia 05/29/2010  . HYPERLIPIDEMIA 09/28/2006  . HYPERTENSION 09/28/2006  . Illiterate  09/11/2012  . LOW BACK PAIN 09/28/2006  . OBESITY 09/28/2006  . PLANTAR FASCIITIS, BILATERAL 07/04/2007    SURGICAL HISTORY Past Surgical History:  Procedure Laterality Date  . APPENDECTOMY      FAMILY HISTORY Family History  Problem Relation Age of Onset  . Colon polyps Sister   . Coronary artery disease Brother   . Heart attack Brother   . Diabetes Brother        2 brothers   . Hypertension Brother     SOCIAL HISTORY  reports that he has quit smoking. he has never used smokeless tobacco. He reports that he uses drugs. Drug: Marijuana. He reports that he does not drink alcohol.  ASSESSMENT/PLAN Mr. Parminder Cupples is a 62 y.o. male with history of hypertension, CAD, diabetes, and hyperlipidemia. presenting with vertigo nausea vomiting. He did not receive IV t-PA due to late presentation.  Strokes: multifocal acute strokes within the right cerebellum and right posterior medulla. Embolic - source unknown.  Resultant  dizziness and imbalance  CT head - Prior infarcts  MRI head - Multifocal acute ischemia within the right cerebellum and right posterior medulla  MRA head - Occlusion of the distal right vertebral artery beginning at the proximal V3  segment, also causing occlusion of the right posterior inferior cerebellar artery.  Severe short segment stenosis of the proximal right internal  carotid artery  Carotid Doppler - MRA neck  2D Echo - EF 60-65%. No cardiac source of emboli identified.  LDL - 191  HgbA1c - 12.5  VTE prophylaxis - Lovenox  Diet Carb Modified Fluid consistency: Thin; Room service appropriate? Yes  No antithrombotic prior to admission, now on aspirin 325 mg daily  Patient will be counseled to be compliant with his antithrombotic medications  Ongoing aggressive stroke risk factor management  Therapy recommendations:  - Pending  Disposition:  Pending  Hypertension  Stable  Permissive hypertension (OK if < 220/120) but gradually normalize in 5-7 days  Long-term BP goal normotensive  Hyperlipidemia  Home meds: Mevacor 40 mg daily prior to admission  LDL 191, goal < 70  Now on Lipitor 80 mg daily  Continue statin at discharge  Diabetes  HgbA1c 12.5, goal < 7.0  Uncontrolled  Other Stroke Risk Factors  Advanced age  Former cigarette smoker - quit  ETOH use, advised to drink no more than 2 drink(s) a day  Obesity, Body mass index is 29.11 kg/m., recommend weight loss, diet and exercise as appropriate   Hx stroke/TIA - by imaging   Other Active Problems  Mild hypokalemia  History of marijuana use with positive UDS    Plan / Recommendations   The patient's strokes are felt to be embolic. He endorses a history of irregular heartbeats. Dr. Pearlean Brownie feels that the patient should have a TEE and possible loop implanted which could be performed Monday. I discussed these studies with the patient's wife and son as well as with the patient. I also told them it might be possible to have these performed on an outpatient basis. The need for stronger anticoagulation if atrial fibrillation was identified was discussed. We also talked about the risk of further strokes. All of their questions were answered. The wife and son requested time to speak with the patient and make a decision. They will then discuss their decision with the teaching  service. The patient was noted to have a severe right internal carotid artery stenosis by MRA; however, this was not the source of the patient's recent  strokes. We'll continue aspirin 325 mg daily. The patient's nurse was present during these discussions.   Hospital day # 1  Delton See PA-C [facility] Pager [phone] 04/06/2017, 3:00 PM I have personally examined this patient, reviewed notes, independently viewed imaging studies, participated in medical decision making and plan of care.ROS completed by me personally and pertinent positives fully documented  I have made any additions or clarifications directly to the above note. Agree with note above.  The patient is clearly presented with the right lateral medullary and cerebellar infarct secondary to terminal right vertebral artery occlusion. In incidentally right parietal white matter infarct is also noted which may be related to concurrent small vessel disease but cardioembolic source needs to be ruled out as well. Patient is extremely uncooperative for exam and refusing further workup. Recommend dual antiplatelet therapy of aspirin and Plavix for 3 months followed by aspirin alone. If patient is cooperative consider doing transesophageal echo and prolonged cardiac monitoring for atrial fibrillation. Greater than 50% time during this 25 minute visit was spent on counseling and coordination of care about his stroke  Delia Heady, MD Medical Director [name] Pager: [phone] 04/06/2017 3:32 PM  To contact Stroke Continuity provider, please refer to [email or web address]. After hours, contact General Neurology

## 2017-04-06 NOTE — Progress Notes (Signed)
   Subjective:  Patient seen and examined. Denies dizziness or vertigo like symptoms. No new focal weakness. Only acute complaint was about the quality of his breakfast. Discussed with the patient he has new strokes seen on brain imaging. Denies history of previous strokes. Also explained the significance of his elevated A1C. Patient expressed understanding. He is eager to be discharged.   Objective:  Vital signs in last 24 hours: Vitals:   04/06/17 0257 04/06/17 0621 04/06/17 0813 04/06/17 1041  BP: (!) 155/86 (!) 161/80  (!) 162/82  Pulse: 65 65  70  Resp: (!) 23 16  16   Temp:   97.9 F (36.6 C) 97.8 F (36.6 C)  TempSrc:   Oral Oral  SpO2: 93% 96%  93%  Weight:      Height:       General: Laying in bed comfortably, NAD HEENT: [place]/AT, saccadic eye movements, rotary nystagmus, no scleral icterus, PERRL Cardiac: RRR, No R/M/G appreciated Pulm: normal effort, CTAB Abd: soft, non tender, non distended, BS normal Ext: extremities well perfused, no peripheral edema Neuro: alert and oriented X3, mild right sided facial droop, mild dysarthria, Proximal and distal strength 5/5 in upper and lower extremities, DTRs 2+ and symmetric; right shin to heal mild dysmetria   Assessment/Plan:  Principal Problem:   CVA (cerebral vascular accident) (HCC) Active Problems:   DKA, type 2 (HCC)   Uncontrolled Type 2 DM, DKA Hemoglobin A1c 12.5. Patient initially presented in DKA presenting with LA 2.46, AG 20, BG in 400s, with + urinary ketones. DKA resolved, AG closed x2, BG responded well to insulin gtt, now on subcutaneous insulin and BG ranging from 180-260. Patient was started on Lantus 15 and SSI moderate TID with meals and HS.  -Continue Lantus 15 units, and SSI mod coverage and Hs coverage -CBG monitoring QID  -BMET in AM   Acute, multifocal ischemic CVA MRI with multifocal acute ischemia within the right cerebellum and right posterior medulla consistent with right PICA infarct. Focus  of acute to subacute ischemia in the periatrial white matter of the right parietal lobe without hemorrhage or mass effect. MRA of neck also completed and showed ccclusion of the distal right vertebral artery beginning at the proximal V3 segment, also causing occlusion of the right posterior inferior cerebellar artery. Patient not TPA candidate as he was ot of the window.  -Neuro on board, appreciate recs -ECHO  -Lipitor 80, high dose aspirin 325 mg daily  -PT, OT, SLP   HTN Hypertensive on admission. Not currently on any medications at home.  -will allow for permissive HTN   Dispo: Anticipated discharge in approximately 0-1 day(s) pending completion of stroke work up.   Toney RakesLacroce, Kevontay Burks J, MD 04/06/2017, 11:12 AM Pager: [phone]

## 2017-04-06 NOTE — Progress Notes (Signed)
Pt given discharge instructions, prescriptions, and care notes. Pt verbalized understanding AEB no further questions or concerns at this time. IV was discontinued, no redness, pain, or swelling noted at this time. Telemetry discontinued and Centralized Telemetry was notified. Pt left the floor via wheelchair with staff in stable condition. 

## 2017-04-06 NOTE — Evaluation (Signed)
Clinical/Bedside Swallow Evaluation Patient Details  Name: John Roman MRN: [id number] Date of Birth: Feb 21, 1955  Today's Date: 04/06/2017 Time: SLP Start Time (ACUTE ONLY): 1548 SLP Stop Time (ACUTE ONLY): 1555 SLP Time Calculation (min) (ACUTE ONLY): 7 min  Past Medical History:  Past Medical History:  Diagnosis Date  . ANXIETY 07/04/2007  . ASTHMATIC BRONCHITIS, ACUTE 12/19/2007  . DIABETES MELLITUS, TYPE II 09/28/2006  . ERECTILE DYSFUNCTION 09/28/2006  . GERD 09/28/2006  . Hematochezia 05/29/2010  . HYPERLIPIDEMIA 09/28/2006  . HYPERTENSION 09/28/2006  . Illiterate 09/11/2012  . LOW BACK PAIN 09/28/2006  . OBESITY 09/28/2006  . PLANTAR FASCIITIS, BILATERAL 07/04/2007   Past Surgical History:  Past Surgical History:  Procedure Laterality Date  . APPENDECTOMY     HPI:  62 yo male PMH of Type 2 diabetes mellitus, HLD, HTN presenting with dizziness, frequent falls, weakness, and nausea. MRI revealed R PICA infarct. CXR No active cardiopulmonary disease.   Assessment / Plan / Recommendation Clinical Impression  Pt fully dressed and waiting for discharge paperwork; reluctant for swallow assessment; doesn't fully believe he had a stroke. He denies coughing etc with po's. No indications of decreased airway protection with brief observation with cup and straw sips thin. Pt states he can masticate regular food and cuts meat into smaller pieces. CXR clear. Recommend continue regular texture, thin liquids. No follow up needed.    SLP Visit Diagnosis: Dysphagia, unspecified (R13.10)    Aspiration Risk  Mild aspiration risk    Diet Recommendation Regular;Thin liquid   Liquid Administration via: Cup;Straw Medication Administration: Whole meds with liquid Supervision: Patient able to self feed Compensations: Slow rate;Small sips/bites Postural Changes: Seated upright at 90 degrees    Other  Recommendations Oral Care Recommendations: Oral care BID   Follow up Recommendations None       Frequency and Duration            Prognosis        Swallow Study   General HPI: 62 yo male PMH of Type 2 diabetes mellitus, HLD, HTN presenting with dizziness, frequent falls, weakness, and nausea. MRI revealed R PICA infarct. CXR No active cardiopulmonary disease. Type of Study: Bedside Swallow Evaluation Previous Swallow Assessment: (none) Diet Prior to this Study: Regular;Thin liquids Temperature Spikes Noted: No Respiratory Status: Room air History of Recent Intubation: No Behavior/Cognition: Alert Oral Cavity Assessment: Within Functional Limits Oral Care Completed by SLP: No Oral Cavity - Dentition: Edentulous Vision: Functional for self-feeding Self-Feeding Abilities: Able to feed self Patient Positioning: Upright in bed Baseline Vocal Quality: Normal Volitional Cough: Strong Volitional Swallow: Able to elicit    Oral/Motor/Sensory Function Overall Oral Motor/Sensory Function: Within functional limits   Ice Chips Ice chips: Not tested   Thin Liquid Thin Liquid: Within functional limits Presentation: Cup;Straw    Nectar Thick Nectar Thick Liquid: Not tested   Honey Thick Honey Thick Liquid: Not tested   Puree Puree: Not tested   Solid   GO   Solid: Not tested        John Roman, John Roman 04/06/2017,4:36 PM  John CoonsLisa Roman Lonell FaceLitaker M.Ed [employer]SLP Pager [phone]

## 2017-04-06 NOTE — Progress Notes (Signed)
  Echocardiogram 2D Echocardiogram has been performed.  Roosvelt MaserLane, [name] 04/06/2017, 12:26 PM

## 2017-04-08 ENCOUNTER — Telehealth: Payer: Self-pay | Admitting: Neurology

## 2017-04-08 NOTE — Telephone Encounter (Signed)
Pt's son called in today to sched a hospital f/u for his father, and the discharge instructions say for pt to be seen in a week. Right now nothing is available in a week, unless we sched it w/ Shanda BumpsJessica. Would this be alright? And if not, when should the pt be seen?

## 2017-04-08 NOTE — Discharge Summary (Signed)
Name: John Roman MRN: [id number] DOB: 07-Sep-1955 62 y.o. PCP: Patient, No Pcp Per  Date of Admission: 04/05/2017 10:58 AM Date of Discharge: 04/06/2017 Attending Physician: Murriel Hopper, MD  Discharge Diagnosis: 1. Acute Cerebral Vascular Accident 2. DKA, type 2 Principal Problem:   CVA (cerebral vascular accident) [facility]) Active Problems:   DKA, type 2 ([place])   Discharge Medications: Allergies as of 04/06/2017   No Known Allergies     Medication List    STOP taking these medications   lovastatin 40 MG tablet Commonly known as:  MEVACOR     TAKE these medications   aspirin 81 MG tablet Take 1 tablet (81 mg total) by mouth daily.   atorvastatin 80 MG tablet Commonly known as:  LIPITOR Take 1 tablet (80 mg total) by mouth daily at 6 PM.   blood glucose meter kit and supplies Kit Check blood sugar up to four times daily. Once in the morning and three times daily with meals. (FOR ICD-9 250.00, 250.01).   clopidogrel 75 MG tablet Commonly known as:  PLAVIX Take 1 tablet (75 mg total) by mouth daily.   insulin glargine 100 UNIT/ML injection Commonly known as:  LANTUS Inject 0.15 mLs (15 Units total) into the skin at bedtime.   metFORMIN 500 MG tablet Commonly known as:  GLUCOPHAGE Take 1 tablet (500 mg total) by mouth daily with breakfast.   mupirocin cream 2 % Commonly known as:  BACTROBAN Apply 1 application topically 3 (three) times daily.       Disposition and follow-up:   John Roman was discharged from [facility] in Stable condition.  At the hospital follow up visit please address:  1.   Cerebrovascular accident Patient found to have multifocal acute ischemia within the right cerebellum and right posterior medulla consistent with right PICA infarct, as well as a focus of acute to subacute ischemia in the periatrial white matter of the right parietal lobe. He did not receive thrombolytic therapy as he was outside treatment  window. Patient was seen by PT, who recommended inpatient rehab, which the patient declined. Neurology also recommended the patient stay in the hospital for TEE and loop recorder placement, which he also declined and opted to follow up with neurology as an outpatient.  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 Hemoglobin A1C 14.5. He presented in DKA. Blood glucose and acidosis improved with insulin infusion and IV fluids. He was transitioned to SQ insulin and discharged with a prescription of 15 units of Lantus at bedtime and metformin 500 mg daily. Discussed at length with the patient and family that he needed to establish care with a PCP to monitor his diabetes and manage his medications.   -Please address compliance with Metformin and Lantus -Consider increasing dosage of metformin if patient amenable  -Encourage routine BG monitoring, the patient was provided with a rx for glucometer kit   2.  Labs / imaging needed at time of follow-up: None  3.  Pending labs/ test needing follow-up: Lipid panel  Lipid Panel     Component Value Date/Time   CHOL 227 (H) 04/06/2017 0734   TRIG 113 04/06/2017 0734   HDL 33 (L) 04/06/2017 0734   CHOLHDL 6.9 04/06/2017 0734   VLDL 23 04/06/2017 0734   LDLCALC 171 (H) 04/06/2017 0734   LDLDIRECT 101.2 05/31/2011 1612    Follow-up Appointments: Follow-up [facility]. Schedule an appointment as soon as possible for a visit in 5  day(s).   Contact information: [place] [address] [phone]        [facility]. Schedule an appointment as soon as possible for a visit in 1 week(s).   Specialty:  Neurology Contact information: [address] [place] [facility] Course by problem list: Principal Problem:   CVA (cerebral vascular accident) [facility]) Active Problems:   DKA, type 2 ([place])   1. Uncontrolled Type 2 Diabetes Mellitus, DKA John Roman was admitted to [facility] and the Internal Medicine Teaching Service for diabetic ketoacidosis secondary to uncontrolled type 2 diabetes mellitus. On initial presentation, blood glucose was 422, anion gap of 20, and urine was positive for ketones. The patient was started on glucose stabilizer infusion of insulin and given IV fluids. He sucessfully transitioned to subcutaneous insulin and his anion gapped closed. Hemoglobin A1C was found to be 12.5. He was able to tolerate a diet and his blood glucose remained stable. He was discharged on 15 units of Lantus and metformin 500 mg daily. The patient and his family were advised to establish care with a primary care doctor as soon as possible to manage his diabetes and new medications.   2. Acute Cerebrovascular accident On presentation John Roman also had physical exam findings concerning for acute stroke including: right sided facial droop, right sided dysmetria, and nystagmus. The patient was evaluated by neurology and CT head was negative for acute stroke, but did show evidence of prior infarcts. MRI of the brain revealed: multifocal acute ischemia within the right cerebellum and right posterior medulla consistent with right PICA infarct; focus of acute to subacute ischemia in the periatrial white matter of the right parietal lobe without hemorrhage or mass effect. He did not receive thrombolytic therapy as he was outside the  treatment window. Echocardiogram revealed normal LV systolic function, no wall motion abnormalities, and a mildly dilated left atrium. The patient was placed on telemetry to monitor for arrhythmias. He remained in normal sinus rhythm the duration of his hospitalization. Neurology wanted the patient to undergo TEE and loop recorder placement, but the patient declined and did not want to stay in the hospital to wait for the procedure to be performed. He was started on high dose aspirin and high intensity statin while hospitalized. Physical therapy evaluated the patient and recommended [facility], which the patient also declined. He was discharged on Aspirin and Plavix and instructed to follow up with [facility].    Discharge Vitals:   BP (!) 162/82   Pulse 70   Temp 97.8 F (36.6 C) (Oral)   Resp 16   Ht 5' 9.5" (1.765 m)   Wt 200 lb (90.7 kg)   SpO2 93%   BMI 29.11 kg/m   Pertinent Labs, Studies, and Procedures:  CMP Latest Ref Rng & Units 04/06/2017 04/06/2017 04/06/2017  Glucose 65 - 99 mg/dL 271(H) 175(H) 164(H)  BUN 6 - 20 mg/dL  _0 Creatinine 0.61 - 1.24 mg/dL 0.72 0.63 0.68  Sodium 135 - 145 mmol/L 132(L) 137 137  Potassium 3.5 - 5.1 mmol/L 3.6 3.2(L) 4.2  Chloride 101 - 111 mmol/L 100(L) 104 103  CO2 22 - 32 mmol/L 20(L) 22 25  Calcium 8.9 - 10.3 mg/dL 9.1 9.4 9.6  Total Protein 6.0 - 8.3 g/dL - - -  Total Bilirubin 0.2 - 1.2 mg/dL - - -  Alkaline Phos 39 - 117 U/L - - -  AST 0 - 37 U/L - - -  ALT 0 - 53 U/L - - -   CBC Latest Ref Rng & Units 04/06/2017 04/05/2017 04/05/2017  WBC 4.0 - 10.5 K/uL 10.3 - 10.6(H)  Hemoglobin 13.0 - 17.0 g/dL 15.7 16.0 16.4  Hematocrit 39.0 - 52.0 % 45.7 47.0 47.6  Platelets 150 - 400 K/uL 193 - 215   CT Head WO Contrast IMPRESSION: Prior infarcts in the mid superior, midportion of the right occipital lobe extending to the parietooccipital junction on the right as well as at the right frontal-parietal junction. There  is mild periventricular small vessel disease. No evident acute infarct. No mass or hemorrhage. There are foci of arterial vascular calcification. There is mucosal thickening in several ethmoid air cells. There is leftward deviation of the nasal septum.  MR Brain, MRA Head, MRA neck  IMPRESSION: 1. Multifocal acute ischemia within the right cerebellum and right posterior medulla consistent with right PICA infarct. No associated hemorrhage or mass effect. 2. Focus of acute to subacute ischemia in the periatrial white matter of the right parietal lobe without hemorrhage or mass effect. 3. Occlusion of the distal right vertebral artery beginning at the proximal V3 segment, also causing occlusion of the right posterior inferior cerebellar artery. 4. Severe short segment stenosis of the proximal right internal carotid artery, which is otherwise normal.  Transthoracic Echocardiogram Study Conclusions - Left ventricle: The cavity size was normal. Wall thickness was   increased in a pattern of mild LVH. Systolic function was normal.   The estimated ejection fraction was in the range of 60% to 65%.   Wall motion was normal; there were no regional wall motion   abnormalities. - Aortic valve: Mildly calcified annulus. Trileaflet; mildly   thickened leaflets. Valve area (VTI): 2.12 cm^2. Valve area   (Vmax): 2.1 cm^2. Valve area (Vmean): 2.12 cm^2. - Mitral valve: Mildly calcified annulus. Mildly thickened leaflets - Left atrium: The atrium was mildly to moderately dilated. - Atrial septum: No defect or patent foramen ovale was identified.  Discharge Instructions: Discharge Instructions    Call MD for:  difficulty breathing, headache or visual disturbances   Complete by:  As directed    Call MD for:  persistant dizziness or light-headedness   Complete by:  As directed    Call MD for:  persistant nausea and vomiting   Complete by:  As directed    Diet - low sodium heart healthy   Complete by:   As directed    Discharge instructions   Complete by:  As directed    John Roman,   We have started you on several new medications to prevent future strokes and help control your diabetes.   Please take Aspirin 81 mg and Plavix 75 daily. You will need to be on Aspirin and Plavix for 3 months durations. After 3 months you no longer will need to take Plavix, and will take aspirin only.   Please take Lipitor 80 mg daily.  I have prescribed you long acting insulin called Lantus. Please inject 15 units of in the skin before bedtime. I have prescribed you a glucometer as well. Check your blood sugar in the morning and with meals.   I have also prescribed you Metformin 500 mg daily.   Please establish care with a primary care provider and follow up with them in 4-7 days to discuss your new diabetes medications.  I have also provided you the number and address of [facility]. Please call them on Monday to schedule a follow up hospital visit and to possible arrange further testing regarding your stroke.   Increase activity slowly   Complete by:  As directed       Signed: Melanee Spry, MD 04/08/2017, 11:02 AM   Pager: [phone]

## 2017-04-08 NOTE — Telephone Encounter (Signed)
Agree with plan 

## 2017-04-09 DIAGNOSIS — E1343 Other specified diabetes mellitus with diabetic autonomic (poly)neuropathy: Secondary | ICD-10-CM | POA: Diagnosis not present

## 2017-04-09 DIAGNOSIS — I639 Cerebral infarction, unspecified: Secondary | ICD-10-CM | POA: Diagnosis not present

## 2017-04-09 DIAGNOSIS — K5900 Constipation, unspecified: Secondary | ICD-10-CM | POA: Diagnosis not present

## 2017-04-09 DIAGNOSIS — R110 Nausea: Secondary | ICD-10-CM | POA: Diagnosis not present

## 2017-04-16 ENCOUNTER — Ambulatory Visit: Payer: Self-pay | Admitting: Adult Health

## 2017-04-16 ENCOUNTER — Encounter: Payer: Self-pay | Admitting: Neurology

## 2017-04-16 ENCOUNTER — Ambulatory Visit: Payer: BLUE CROSS/BLUE SHIELD | Admitting: Neurology

## 2017-04-16 VITALS — BP 141/75 | HR 77 | Ht 69.0 in | Wt 197.2 lb

## 2017-04-16 DIAGNOSIS — I6501 Occlusion and stenosis of right vertebral artery: Secondary | ICD-10-CM

## 2017-04-16 NOTE — Patient Instructions (Signed)
I had a long d/w patient about his recent stroke, risk for recurrent stroke/TIAs, personally independently reviewed imaging studies and stroke evaluation results and answered questions.Continue aspirin and Plavix for 3 months followed by aspirin alone for secondary stroke prevention and maintain strict control of hypertension with blood pressure goal below 130/90, diabetes with hemoglobin A1c goal below 6.5% and lipids with LDL cholesterol goal below 70 mg/dL. I also advised the patient to eat a healthy diet with plenty of whole grains, cereals, fruits and vegetables, exercise regularly and maintain ideal body weight .recommend outpatient physical and occupational therapy. Consider possible participation in the PREMIERs stroke trial if interested. I strongly encouraged the patient and family to find him a primary care physician to work towards tighter control of blood pressure, diabetes and cholesterol Followup in the future with my nurse practitioner in 3 months or call earlier if necessary  Stroke Prevention Some medical conditions and behaviors are associated with a higher chance of having a stroke. You can help prevent a stroke by making nutrition, lifestyle, and other changes, including managing any medical conditions you may have. What nutrition changes can be made?  Eat healthy foods. You can do this by: ? Choosing foods high in fiber, such as fresh fruits and vegetables and whole grains. ? Eating at least 5 or more servings of fruits and vegetables a day. Try to fill half of your plate at each meal with fruits and vegetables. ? Choosing lean protein foods, such as lean cuts of meat, poultry without skin, fish, tofu, beans, and nuts. ? Eating low-fat dairy products. ? Avoiding foods that are high in salt (sodium). This can help lower blood pressure. ? Avoiding foods that have saturated fat, trans fat, and cholesterol. This can help prevent high cholesterol. ? Avoiding processed and premade  foods.  Follow your health care provider's specific guidelines for losing weight, controlling high blood pressure (hypertension), lowering high cholesterol, and managing diabetes. These may include: ? Reducing your daily calorie intake. ? Limiting your daily sodium intake to 1,500 milligrams (mg). ? Using only healthy fats for cooking, such as olive oil, canola oil, or sunflower oil. ? Counting your daily carbohydrate intake. What lifestyle changes can be made?  Maintain a healthy weight. Talk to your health care provider about your ideal weight.  Get at least 30 minutes of moderate physical activity at least 5 days a week. Moderate activity includes brisk walking, biking, and swimming.  Do not use any products that contain nicotine or tobacco, such as cigarettes and e-cigarettes. If you need help quitting, ask your health care provider. It may also be helpful to avoid exposure to secondhand smoke.  Limit alcohol intake to no more than 1 drink a day for nonpregnant women and 2 drinks a day for men. One drink equals 12 oz of beer, 5 oz of wine, or 1 oz of hard liquor.  Stop any illegal drug use.  Avoid taking birth control pills. Talk to your health care provider about the risks of taking birth control pills if: ? You are over 4 years old. ? You smoke. ? You get migraines. ? You have ever had a blood clot. What other changes can be made?  Manage your cholesterol levels. ? Eating a healthy diet is important for preventing high cholesterol. If cholesterol cannot be managed through diet alone, you may also need to take medicines. ? Take any prescribed medicines to control your cholesterol as told by your health care provider.  Manage your  diabetes. ? Eating a healthy diet and exercising regularly are important parts of managing your blood sugar. If your blood sugar cannot be managed through diet and exercise, you may need to take medicines. ? Take any prescribed medicines to control  your diabetes as told by your health care provider.  Control your hypertension. ? To reduce your risk of stroke, try to keep your blood pressure below 130/80. ? Eating a healthy diet and exercising regularly are an important part of controlling your blood pressure. If your blood pressure cannot be managed through diet and exercise, you may need to take medicines. ? Take any prescribed medicines to control hypertension as told by your health care provider. ? Ask your health care provider if you should monitor your blood pressure at home. ? Have your blood pressure checked every year, even if your blood pressure is normal. Blood pressure increases with age and some medical conditions.  Get evaluated for sleep disorders (sleep apnea). Talk to your health care provider about getting a sleep evaluation if you snore a lot or have excessive sleepiness.  Take over-the-counter and prescription medicines only as told by your health care provider. Aspirin or blood thinners (antiplatelets or anticoagulants) may be recommended to reduce your risk of forming blood clots that can lead to stroke.  Make sure that any other medical conditions you have, such as atrial fibrillation or atherosclerosis, are managed. What are the warning signs of a stroke? The warning signs of a stroke can be easily remembered as BEFAST.  B is for balance. Signs include: ? Dizziness. ? Loss of balance or coordination. ? Sudden trouble walking.  E is for eyes. Signs include: ? A sudden change in vision. ? Trouble seeing.  F is for face. Signs include: ? Sudden weakness or numbness of the face. ? The face or eyelid drooping to one side.  A is for arms. Signs include: ? Sudden weakness or numbness of the arm, usually on one side of the body.  S is for speech. Signs include: ? Trouble speaking (aphasia). ? Trouble understanding.  T is for time. ? These symptoms may represent a serious problem that is an emergency. Do not  wait to see if the symptoms will go away. Get medical help right away. Call your local emergency services (911 in the [place] Do not drive yourself to the hospital.  Other signs of stroke may include: ? A sudden, severe headache with no known cause. ? Nausea or vomiting. ? Seizure.  Where to find more information: For more information, visit:  American Stroke Association: www.strokeassociation.org  National Stroke Association: www.stroke.org  Summary  You can prevent a stroke by eating healthy, exercising, not smoking, limiting alcohol intake, and managing any medical conditions you may have.  Do not use any products that contain nicotine or tobacco, such as cigarettes and e-cigarettes. If you need help quitting, ask your health care provider. It may also be helpful to avoid exposure to secondhand smoke.  Remember BEFAST for warning signs of stroke. Get help right away if you or a loved one has any of these signs. This information is not intended to replace advice given to you by your health care provider. Make sure you discuss any questions you have with your health care provider. Document Released: 03/01/2004 Document Revised: 02/28/2016 Document Reviewed: 02/28/2016 Elsevier Interactive Patient Education  [employer]2018 [employer]

## 2017-04-16 NOTE — Progress Notes (Signed)
[facility] [address] [place]. [place] [address] [phone]       OFFICE FOLLOW-UP NOTE  John. John Roman Date of Birth:  Jul 29, 1955 Medical Record Number:  [id number]   HPI: John Roman is a 22 year Caucasian male seen today for first office follow-up visit following hospital admission for stroke in March 2019. History is obtained from the patient, his wife and daughter who accompany him today as well as review of electronic medical records. I have personally reviewed imaging films.[name] is an 62 y.o. male with hypertension, diabetes, hyperlipidemia.  States that this past Tuesday he had an episode of vertigo nausea vomiting however this subsided and thus he felt it was just flu.  However last night patient went to bed at approximately 2300 hrs. and felt fine.  He got up at 0100 hrs. and attempted to go to the bathroom but Leaning to the side in fact fell to the floor.  Patient felt his room spinning but cannot tell me which way it was going.  In addition to this he was nauseous and did vomit.  Patient was brought to the emergency department where he was found in DKA and stroke was suspected.  CT of head was obtained and did not show any intracranial abnormalities.  However exam still shows abnormalities that are indicative of stroke.  Patient was not a TPA candidate as he was out of the window and this does not appear to be a large vessel occlusion and thus he is not a interventional radiology. Date last known well: Date: 04/04/2017 Time last known well: Time: 22:00 tPA Given: No: Out of the window Modified Rankin prior to stroke: Rankin Score=0 CT scan of the head showed low density in the right occipital, right mid superior cerebellar and parieto-occipital junction as well as right frontoparietal junction compatible with acute infarcts. MRI scan of the brain confirmed acute infarcts in these locations including the right posterior to medulla. The distal right vertebral  artery was occluded in the V3 segment. Was also short segment high-grade stenosis of the proximal right internal carotid artery on the MRA of the neck. Transthoracic echo showed normal ejection fraction. LDL cholesterol is elevated at 191 mg percent and hemoglobin A1c at 12.5. Patient was started on dual antiplatelet therapy aspirin and Plavix as well as Lipitor 80 mg. The patient states he is doing better his double vision has improved. Dizziness is also much better though he does get slightly dizzy and off balance when he walks quickly or makes a sudden turn. His swallowing is also much improved. The patient has not Seen a primary care doctor but plans to see one soon. He is tolerating aspirin and Plavix without bleeding or bruising. Patient has not been referred to outpatient physical occupational therapy yet. The patient is on Glucophage for his diabetes and needs to see a medical doctor and have the dose adjusted. He has no prior history of strokes or TIAs. During the hospitalization TEE and prolonged cardiac monitoring were considered but not done given his poor compliance and lack of follow-up and he was very uncooperative for testing in the hospital.    ROS:   14 system review of systems is positive for  blurred vision, cough, excessive thirst, gait ataxia, dizziness, imbalance, dysphagia and all other systems negative  PMH:  Past Medical History:  Diagnosis Date  . ANXIETY 07/04/2007  . ASTHMATIC BRONCHITIS, ACUTE 12/19/2007  . DIABETES MELLITUS, TYPE II 09/28/2006  . ERECTILE DYSFUNCTION 09/28/2006  .  GERD 09/28/2006  . Hematochezia 05/29/2010  . HYPERLIPIDEMIA 09/28/2006  . HYPERTENSION 09/28/2006  . Illiterate 09/11/2012  . LOW BACK PAIN 09/28/2006  . OBESITY 09/28/2006  . PLANTAR FASCIITIS, BILATERAL 07/04/2007  . Stroke [facility])     Social History:  Social History   Socioeconomic History  . Marital status: Married    Spouse name: Not on file  . Number of children: 1  . Years of  education: Not on file  . Highest education level: Not on file  Social Needs  . Financial resource strain: Not on file  . Food insecurity - worry: Not on file  . Food insecurity - inability: Not on file  . Transportation needs - medical: Not on file  . Transportation needs - non-medical: Not on file  Occupational History  . Occupation: Clinical biochemist  Tobacco Use  . Smoking status: Former Research scientist (life sciences)  . Smokeless tobacco: Never Used  Substance and Sexual Activity  . Alcohol use: No  . Drug use: Yes    Types: Marijuana    Comment: occasional marijanua use anytime he can  . Sexual activity: Yes  Other Topics Concern  . Not on file  Social History Narrative  . Not on file    Medications:   Current Outpatient Medications on File Prior to Visit  Medication Sig Dispense Refill  . aspirin 81 MG EC tablet Take 81 mg by mouth daily.  0  . atorvastatin (LIPITOR) 80 MG tablet Take 1 tablet (80 mg total) by mouth daily at 6 PM. 30 tablet 0  . blood glucose meter kit and supplies KIT Check blood sugar up to four times daily. Once in the morning and three times daily with meals. (FOR ICD-9 250.00, 250.01). 1 each 0  . clopidogrel (PLAVIX) 75 MG tablet Take 1 tablet (75 mg total) by mouth daily. 30 tablet 0  . insulin glargine (LANTUS) 100 UNIT/ML injection Inject 0.15 mLs (15 Units total) into the skin at bedtime. 10 mL 1  . metFORMIN (GLUCOPHAGE) 500 MG tablet Take 1 tablet (500 mg total) by mouth daily with breakfast. 30 tablet 11   No current facility-administered medications on file prior to visit.     Allergies:  No Known Allergies  Physical Exam General: well developed, well nourished middle-age Caucasian male, seated, in no evident distress Head: head normocephalic and atraumatic.  Neck: supple with no carotid or supraclavicular bruits Cardiovascular: regular rate and rhythm, no murmurs Musculoskeletal: no deformity Skin:  no rash/petichiae Vascular:  Normal pulses all  extremities Vitals:   04/16/17 1154  BP: (!) 141/75  Pulse: 77   Neurologic Exam Mental Status: Awake and fully alert. Oriented to place and time. Recent and remote memory intact. Attention span, concentration and fund of knowledge appropriate. Mood and affect appropriate.  Cranial Nerves: Fundoscopic exam reveals sharp disc margins. Pupils equal, briskly reactive to light. Extraocular movements full without nystagmus but saccadic dysmetria while looking to the right.. Visual fields full to confrontation. Hearing intact. Facial sensation intact. Face, tongue, palate moves normally and symmetrically.  Motor: Normal bulk and tone. Normal strength in all tested extremity muscles. Sensory.: intact to touch ,pinprick .position and vibratory sensation.  Coordination: Rapid alternating movements normal in all extremities. Mild Finger-to-nose and heel-to-shin ataxia on the right Gait and Station: Arises from chair without difficulty. Stance is broad-based. Slightly ataxic gait. Leans to the right. Unable to walk tandem..  Reflexes: 1+ and symmetric. Toes downgoing.   NIHSS  2 Modified Rankin  2  ASSESSMENT: 62 year old Caucasian male with right cerebellar and medullary infarct in March 2019 secondary to right vertebral artery occlusion. Also silent right parietal white matter infarct. Vascular risk factors of uncontrolled diabetes, hypertension, hyperlipidemia and internal extracranial atherosclerosis.    PLAN: I had a long d/w patient about his recent stroke, risk for recurrent stroke/TIAs, personally independently reviewed imaging studies and stroke evaluation results and answered questions.Continue aspirin and Plavix for 3 months followed by aspirin alone for secondary stroke prevention and maintain strict control of hypertension with blood pressure goal below 130/90, diabetes with hemoglobin A1c goal below 6.5% and lipids with LDL cholesterol goal below 70 mg/dL. I also advised the patient to  eat a healthy diet with plenty of whole grains, cereals, fruits and vegetables, exercise regularly and maintain ideal body weight .recommend outpatient physical and occupational therapy. Consider possible participation in the PREMIERs stroke trial if interested. I strongly encouraged the patient and family to find him a primary care physician to work towards tighter control of blood pressure, diabetes and cholesterol Followup in the future with my nurse practitioner in 3 months or call earlier if necessary Greater than 50% of time during this 25 minute visit was spent on counseling,explanation of diagnosis, planning of further management, discussion with patient and family and coordination of care Antony Contras, MD  [facility] [address] [place], [place] [address]  Phone [phone] Fax [phone] Note: This document was prepared with digital dictation and possible smart phrase technology. Any transcriptional errors that result from this process are unintentional

## 2017-05-01 ENCOUNTER — Ambulatory Visit: Payer: BLUE CROSS/BLUE SHIELD | Admitting: Occupational Therapy

## 2017-05-01 ENCOUNTER — Other Ambulatory Visit: Payer: Self-pay

## 2017-05-01 ENCOUNTER — Encounter: Payer: Self-pay | Admitting: Medical

## 2017-05-01 ENCOUNTER — Ambulatory Visit: Payer: BLUE CROSS/BLUE SHIELD | Admitting: Medical

## 2017-05-01 ENCOUNTER — Ambulatory Visit: Payer: BLUE CROSS/BLUE SHIELD | Attending: Neurology | Admitting: Physical Therapy

## 2017-05-01 VITALS — BP 135/86 | HR 88

## 2017-05-01 VITALS — BP 130/80 | HR 73 | Temp 97.6°F | Ht 68.0 in | Wt 197.4 lb

## 2017-05-01 DIAGNOSIS — R2681 Unsteadiness on feet: Secondary | ICD-10-CM | POA: Diagnosis not present

## 2017-05-01 DIAGNOSIS — R278 Other lack of coordination: Secondary | ICD-10-CM

## 2017-05-01 DIAGNOSIS — R208 Other disturbances of skin sensation: Secondary | ICD-10-CM

## 2017-05-01 DIAGNOSIS — E1165 Type 2 diabetes mellitus with hyperglycemia: Secondary | ICD-10-CM | POA: Insufficient documentation

## 2017-05-01 DIAGNOSIS — R270 Ataxia, unspecified: Secondary | ICD-10-CM

## 2017-05-01 DIAGNOSIS — R42 Dizziness and giddiness: Secondary | ICD-10-CM | POA: Diagnosis not present

## 2017-05-01 DIAGNOSIS — I6300 Cerebral infarction due to thrombosis of unspecified precerebral artery: Secondary | ICD-10-CM

## 2017-05-01 DIAGNOSIS — R262 Difficulty in walking, not elsewhere classified: Secondary | ICD-10-CM

## 2017-05-01 DIAGNOSIS — Z550 Illiteracy and low-level literacy: Secondary | ICD-10-CM | POA: Diagnosis not present

## 2017-05-01 DIAGNOSIS — M6281 Muscle weakness (generalized): Secondary | ICD-10-CM | POA: Diagnosis not present

## 2017-05-01 DIAGNOSIS — Z9114 Patient's other noncompliance with medication regimen: Secondary | ICD-10-CM | POA: Insufficient documentation

## 2017-05-01 DIAGNOSIS — Z794 Long term (current) use of insulin: Secondary | ICD-10-CM | POA: Insufficient documentation

## 2017-05-01 MED ORDER — ASPIRIN 81 MG PO TBEC: 81.0000 mg | DELAYED_RELEASE_TABLET | Freq: Every day | ORAL | 3 refills | Status: DC

## 2017-05-01 MED ORDER — PEN NEEDLES 32G X 4 MM MISC: 180.0000 | Freq: Two times a day (BID) | 2 refills | Status: DC

## 2017-05-01 MED ORDER — METFORMIN HCL 500 MG PO TABS: 500.0000 mg | ORAL_TABLET | Freq: Every day | ORAL | 1 refills | Status: DC

## 2017-05-01 MED ORDER — ONDANSETRON HCL 8 MG PO TABS: 8.0000 mg | ORAL_TABLET | Freq: Three times a day (TID) | ORAL | 1 refills | Status: DC | PRN

## 2017-05-01 MED ORDER — ATORVASTATIN CALCIUM 80 MG PO TABS: 80.0000 mg | ORAL_TABLET | Freq: Every day | ORAL | 1 refills | Status: DC

## 2017-05-01 MED ORDER — CLOPIDOGREL BISULFATE 75 MG PO TABS: 75.0000 mg | ORAL_TABLET | Freq: Every day | ORAL | 0 refills | Status: DC

## 2017-05-01 MED ORDER — INSULIN GLARGINE 100 UNIT/ML SOLOSTAR PEN: 30.0000 [IU] | PEN_INJECTOR | Freq: Every day | SUBCUTANEOUS | 3 refills | Status: DC

## 2017-05-01 NOTE — Patient Instructions (Signed)
Recommendations:  Continue your current medications  You can continue Aspirin 71m instead of the 3233mdose you have run out of  Incre[facility] Lantus long acting [facility], then go up to 20 units at night for the next week  You can increase the Lantus insulin 2 units per week until your fasting morning sugars are <130.  This may take several weeks to get to this point  Begin Humalog fast acting insulin 5 units with breakfast and 5 units with supper for the next 3-4 weeks.  Check your sugar every morning fasting.  Check your sugar anytime you feel sweaty, weak, or not feeling good to make sure your sugars aren't <70 and are not over 400.  Recheck here in 3-4 weeks     Hypoglycemia Hypoglycemia is when the sugar (glucose) level in the blood is too low. Symptoms of low blood sugar may include:  Feeling: ? Hungry. ? Worried or nervous (anxious). ? Sweaty and clammy. ? Confused. ? Dizzy. ? Sleepy. ? Sick to your stomach (nauseous).  Having: ? A fast heartbeat. ? A headache. ? A change in your vision. ? Jerky movements that you cannot control (seizure). ? Nightmares. ? Tingling or no feeling (numbness) around the mouth, lips, or tongue.  Having trouble with: ? Talking. ? Paying attention (concentrating). ? Moving (coordination). ? Sleeping.  Shaking.  Passing out (fainting).  Getting upset easily (irritability).  Low blood sugar can happen to people who have diabetes and people who do not have diabetes. Low blood sugar can happen quickly, and it can be an emergency. Treating Low Blood Sugar Low blood sugar is often treated by eating or drinking something sugary right away. If you can think clearly and swallow safely, follow the 15:15 rule:  Take 15 grams of a fast-acting carb (carbohydrate). Some fast-acting carbs are: ? 1 tube of glucose gel. ? 3 sugar tablets (glucose pills). ? 6-8 pieces of hard candy. ? 4 oz (120 mL) of  fruit juice. ? 4 oz (120 mL) of regular (not diet) soda.  Check your blood sugar 15 minutes after you take the carb.  If your blood sugar is still at or below 70 mg/dL (3.9 mmol/L), take 15 grams of a carb again.  If your blood sugar does not go above 70 mg/dL (3.9 mmol/L) after 3 tries, get help right away.  After your blood sugar goes back to normal, eat a meal or a snack within 1 hour.  Treating Very Low Blood Sugar If your blood sugar is at or below 54 mg/dL (3 mmol/L), you have very low blood sugar (severe hypoglycemia). This is an emergency. Do not wait to see if the symptoms will go away. Get medical help right away. Call your local emergency services (911 in the [place] Do not drive yourself to the hospital. If you have very low blood sugar and you cannot eat or drink, you may need a glucagon shot (injection). A family member or friend should learn how to check your blood sugar and how to give you a glucagon shot. Ask your doctor if you need to have a glucagon shot kit at home. Follow these instructions at home: General instructions  Avoid any diets that cause you to not eat enough food. Talk with your doctor before you start any new diet.  Take over-the-counter and prescription medicines only as told by your doctor.  Limit alcohol to no more than 1 drink per day  for nonpregnant women and 2 drinks per day for men. One drink equals 12 oz of beer, 5 oz of wine, or 1 oz of hard liquor.  Keep all follow-up visits as told by your doctor. This is important. If You Have Diabetes:   Make sure you know the symptoms of low blood sugar.  Always keep a source of sugar with you, such as: ? Sugar. ? Sugar tablets. ? Glucose gel. ? Fruit juice. ? Regular soda (not diet soda). ? Milk. ? Hard candy. ? Honey.  Take your medicines as told.  Follow your exercise and meal plan. ? Eat on time. Do not skip meals. ? Follow your sick day plan when you cannot eat or drink normally. Make this  plan ahead of time with your doctor.  Check your blood sugar as often as told by your doctor. Always check before and after exercise.  Share your diabetes care plan with: ? Your work or school. ? People you live with.  Check your pee (urine) for ketones: ? When you are sick. ? As told by your doctor.  Carry a card or wear jewelry that says you have diabetes. If You Have Low Blood Sugar From Other Causes:   Check your blood sugar as often as told by your doctor.  Follow instructions from your doctor about what you cannot eat or drink. Contact a doctor if:  You have trouble keeping your blood sugar in your target range.  You have low blood sugar often. Get help right away if:  You still have symptoms after you eat or drink something sugary.  Your blood sugar is at or below 54 mg/dL (3 mmol/L).  You have jerky movements that you cannot control.  You pass out. These symptoms may be an emergency. Do not wait to see if the symptoms will go away. Get medical help right away. Call your local emergency services (911 in the [place] Do not drive yourself to the hospital. This information is not intended to replace advice given to you by your health care provider. Make sure you discuss any questions you have with your health care provider. Document Released: 04/18/2009 Document Revised: 06/30/2015 Document Reviewed: 02/25/2015 Elsevier Interactive Patient Education  [employer].

## 2017-05-01 NOTE — Progress Notes (Signed)
Subjective: Chief Complaint  Patient presents with  . other    establish care   Here to establish care.  Of note, he cannot read or write.  Was seeing Dr. Jenny Reichmann prior from [place].  Had a disagreement with Dr. Jenny Reichmann about medication, didn't want to go on insulin.  He has a hx/o diabetes, HTN, hyperlipidemia, obesity, hx/o stroke, ED, GERD, bronchitis, and anxiety.  He was hospitalized March 1 - April 06, 2017 for stroke and uncontrolled diabetes  When he went into the hospital he had not been taking any diabetic medication.   His sister recently died from cancer and stroke.  During the last month he wasn't taken care of himself.    He feels ok today.   He is compliant with medication but needs refills on all medication.   He saw neurology in follow up already since the stroke.   Past Medical History:  Diagnosis Date  . ANXIETY 07/04/2007  . ASTHMATIC BRONCHITIS, ACUTE 12/19/2007  . DIABETES MELLITUS, TYPE II 09/28/2006  . ERECTILE DYSFUNCTION 09/28/2006  . GERD 09/28/2006  . Hematochezia 05/29/2010  . HYPERLIPIDEMIA 09/28/2006  . HYPERTENSION 09/28/2006  . Illiterate 09/11/2012  . LOW BACK PAIN 09/28/2006  . OBESITY 09/28/2006  . PLANTAR FASCIITIS, BILATERAL 07/04/2007  . Stroke [facility])     Current Outpatient Medications on File Prior to Visit  Medication Sig Dispense Refill  . blood glucose meter kit and supplies KIT Check blood sugar up to four times daily. Once in the morning and three times daily with meals. (FOR ICD-9 250.00, 250.01). 1 each 0   No current facility-administered medications on file prior to visit.    ROS as in subjective    Objective: BP 130/80 (BP Location: Right Arm, Patient Position: Sitting)   Pulse 73   Temp 97.6 F (36.4 C)   Ht 5' 8"  (1.727 m)   Wt 197 lb 6.4 oz (89.5 kg)   SpO2 96%   BMI 30.01 kg/m    General appearance: alert, no distress, WD/WN, white male HEENT: normocephalic, sclerae anicteric, PERRLA, EOMi, nares patent, no discharge or  erythema, pharynx normal Oral cavity: MMM, no lesions, missing a lot of teeth Neck: supple, no lymphadenopathy, no thyromegaly, no masses, no bruits Heart: RRR, normal S1, S2, no murmurs Lungs: CTA bilaterally, no wheezes, rhonchi, or rales Extremities: no edema, no cyanosis, no clubbing Pulses: 2+ symmetric, upper and lower extremities, normal cap refill Neurological: mild finger to nose ataxia on right, stance is broad, slightly ataxic gait, otherwise alert, oriented x 3, CN2-12 intact, strength normal upper extremities and lower extremities, sensation normal throughout, DTRs 2+ throughout Psychiatric: normal affect, behavior normal, pleasant    Assessment: Encounter Diagnoses  Name Primary?  . Cerebrovascular accident (CVA) due to thrombosis of precerebral artery ([place]) Yes  . Uncontrolled type 2 diabetes mellitus with hyperglycemia ([place])   . History of medication noncompliance   . Ataxia   . Illiteracy      Plan: I reviewed his recent hospitalization records, labs, imaging, medications reviewed.    He was recently hospitalized from March 1 - April 06, 2017 for dizziness and ataxia, found to have multifocal acute ischemic infarcts, poorly controlled diabetes with DKA at admission.   He was started on the regimen of medications below.     I reviewed his neurology consult notes from 04/16/17.  Refilled all of his medication today  discussed need to get get glucose under much better control.     Recommendations:  Continue  your current medications  You can continue Aspirin 20m instead of the 3282mdose you have run out of  Increase Lantus long acting insulin [facility] 3 days, then go up to 20 units at night for the next week  You can increase the Lantus insulin 2 units per week until your fasting morning sugars are <130.  This may take several weeks to get to this point  Begin Humalog fast acting insulin 5 units with breakfast and 5 units with supper for  the next 3-4 weeks.  Check your sugar every morning fasting.  Check your sugar anytime you feel sweaty, weak, or not feeling good to make sure your sugars aren't <70 and are not over 400.  Recheck here in 3-4 weeks.   He voices understanding and agreement of plan.  RoJakoreyas seen today for other.  Diagnoses and all orders for this visit:  Cerebrovascular accident (CVA) due to thrombosis of precerebral artery ([place] Uncontrolled type 2 diabetes mellitus with hyperglycemia ([place] History of medication noncompliance  Ataxia  Illiteracy  Other orders -     aspirin 81 MG EC tablet; Take 1 tablet (81 mg total) by mouth daily. -     atorvastatin (LIPITOR) 80 MG tablet; Take 1 tablet (80 mg total) by mouth daily at 6 PM. -     clopidogrel (PLAVIX) 75 MG tablet; Take 1 tablet (75 mg total) by mouth daily. -     metFORMIN (GLUCOPHAGE) 500 MG tablet; Take 1 tablet (500 mg total) by mouth daily with breakfast. -     ondansetron (ZOFRAN) 8 MG tablet; Take 1 tablet (8 mg total) by mouth every 8 (eight) hours as needed for nausea or vomiting. -     Insulin Glargine (LANTUS SOLOSTAR) 100 UNIT/ML Solostar Pen; Inject 30 Units into the skin daily at 10 pm.
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
## 2017-05-01 NOTE — Addendum Note (Signed)
Addended by: Jac CanavanYSINGER, Aryam Zhan S on: 05/01/2017 04:20 PM   Modules accepted: Level of Service
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
## 2017-05-07 ENCOUNTER — Encounter: Payer: Self-pay | Admitting: Occupational Therapy

## 2017-05-07 ENCOUNTER — Ambulatory Visit: Payer: BLUE CROSS/BLUE SHIELD | Attending: Neurology | Admitting: Occupational Therapy

## 2017-05-07 VITALS — BP 126/75

## 2017-05-07 DIAGNOSIS — R262 Difficulty in walking, not elsewhere classified: Secondary | ICD-10-CM | POA: Diagnosis not present

## 2017-05-07 DIAGNOSIS — R2681 Unsteadiness on feet: Secondary | ICD-10-CM | POA: Diagnosis not present

## 2017-05-07 DIAGNOSIS — R42 Dizziness and giddiness: Secondary | ICD-10-CM | POA: Diagnosis not present

## 2017-05-07 DIAGNOSIS — M6281 Muscle weakness (generalized): Secondary | ICD-10-CM

## 2017-05-07 DIAGNOSIS — R208 Other disturbances of skin sensation: Secondary | ICD-10-CM

## 2017-05-07 DIAGNOSIS — R278 Other lack of coordination: Secondary | ICD-10-CM
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
## 2017-05-07 NOTE — Patient Instructions (Addendum)
  Strengthening: Resisted Flexion   Hold tubing with __each___ arm(s) at side. Pull forward and up. Move shoulder through pain-free range of motion. Repeat __10__ times per set.  Do _1-2_ sessions per day , every other day   Strengthening: Resisted Extension   Hold tubing in _each____ hand(s), arm forward. Pull arm back, elbow straight. Repeat _10___ times per set. Do _1-2___ sessions per day, every other day.   Resisted Horizontal Abduction: Bilateral   Sit or stand, tubing in both hands, arms out in front. Keeping arms straight, pinch shoulder blades together and stretch arms out. Repeat _10___ times per set. Do _1-2___ sessions per day, every other day.   Elbow Flexion: Resisted   With tubing held in __each____ hand(s) and other end secured under foot, curl arm up as far as possible. Repeat _10___ times per set. Do _1-2___ sessions per day, every other day.    Elbow Extension: Resisted   Sit in chair with resistive band secured at armrest (or hold with other hand) and ___each____ elbow bent. Straighten elbow. Repeat _10___ times per set.  Do _1-2___ sessions per day, every other day.       Copyright  VHI. All rights reserved.  1. Grip Strengthening (Resistive Putty)   Squeeze putty using thumb and all fingers. Repeat _20___ times. Do __2__ sessions per day.   2. Roll putty into tube on table and pinch between each finger and thumb x 10 reps each. (can do ring and small finger together)     Copyright  VHI. All rights reserved.

## 2017-05-09 ENCOUNTER — Encounter: Payer: BLUE CROSS/BLUE SHIELD | Admitting: Occupational Therapy

## 2017-05-13 ENCOUNTER — Ambulatory Visit: Payer: BLUE CROSS/BLUE SHIELD | Admitting: Occupational Therapy

## 2017-05-15 ENCOUNTER — Encounter: Payer: BLUE CROSS/BLUE SHIELD | Admitting: Occupational Therapy

## 2017-05-16 ENCOUNTER — Ambulatory Visit: Payer: BLUE CROSS/BLUE SHIELD | Admitting: Rehabilitation

## 2017-05-21 ENCOUNTER — Ambulatory Visit: Payer: BLUE CROSS/BLUE SHIELD | Admitting: Medical

## 2017-05-23 ENCOUNTER — Encounter: Payer: Self-pay | Admitting: Rehabilitation

## 2017-05-23 ENCOUNTER — Ambulatory Visit: Payer: BLUE CROSS/BLUE SHIELD | Admitting: Occupational Therapy

## 2017-05-23 ENCOUNTER — Ambulatory Visit: Payer: BLUE CROSS/BLUE SHIELD | Admitting: Rehabilitation

## 2017-05-23 DIAGNOSIS — M6281 Muscle weakness (generalized): Secondary | ICD-10-CM

## 2017-05-23 DIAGNOSIS — R42 Dizziness and giddiness: Secondary | ICD-10-CM

## 2017-05-23 DIAGNOSIS — R208 Other disturbances of skin sensation: Secondary | ICD-10-CM | POA: Diagnosis not present

## 2017-05-23 DIAGNOSIS — R278 Other lack of coordination: Secondary | ICD-10-CM

## 2017-05-23 DIAGNOSIS — R2681 Unsteadiness on feet: Secondary | ICD-10-CM

## 2017-05-23 DIAGNOSIS — R262 Difficulty in walking, not elsewhere classified: Secondary | ICD-10-CM | POA: Diagnosis not present
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
## 2017-05-23 NOTE — Patient Instructions (Addendum)
Feet Together (Compliant Surface) Arm Motion - Eyes Closed    Stand on compliant surface: ___pillow_____ with feet together. Close eyes and keep arms by your side.  Repeat __3__ times per session for 30 secs each. Do __2__ sessions per day.  Copyright  VHI. All rights reserved.   Feet Together (Compliant Surface) Head Motion - Eyes Closed    Stand on compliant surface: __pillow ______ with feet together. Close eyes and move head slowly, up and down x 10 reps, side to side x 10 reps  Repeat __1__ times per session. Do __2__ sessions per day.  Copyright  VHI. All rights reserved.   "I love a Parade" Lift    Using a chair if necessary, march forwards and backwards along counter top very SLOWLY!! Do 4 laps down and back.   Do __2__ sessions per day.  http://gt2.exer.us/344   Copyright  VHI. All rights reserved.   Feet Heel-Toe "Tandem"    Arms outstretched, walk a straight line bringing one foot directly in front of the other. Do 4 laps along the counter top.   Do __2__ sessions per day.  Copyright  VHI. All rights reserved.

## 2017-05-29 ENCOUNTER — Ambulatory Visit: Payer: BLUE CROSS/BLUE SHIELD | Admitting: Physical Therapy

## 2017-05-29 ENCOUNTER — Encounter: Payer: BLUE CROSS/BLUE SHIELD | Admitting: Occupational Therapy

## 2017-05-31 ENCOUNTER — Encounter: Payer: BLUE CROSS/BLUE SHIELD | Admitting: Occupational Therapy

## 2017-05-31 ENCOUNTER — Ambulatory Visit: Payer: BLUE CROSS/BLUE SHIELD | Admitting: Physical Therapy

## 2017-05-31 ENCOUNTER — Encounter: Payer: Self-pay | Admitting: Physical Therapy

## 2017-05-31 VITALS — BP 152/90 | HR 70

## 2017-05-31 DIAGNOSIS — R42 Dizziness and giddiness: Secondary | ICD-10-CM | POA: Diagnosis not present

## 2017-05-31 DIAGNOSIS — R208 Other disturbances of skin sensation: Secondary | ICD-10-CM

## 2017-05-31 DIAGNOSIS — R262 Difficulty in walking, not elsewhere classified: Secondary | ICD-10-CM | POA: Diagnosis not present

## 2017-05-31 DIAGNOSIS — R2681 Unsteadiness on feet: Secondary | ICD-10-CM | POA: Diagnosis not present

## 2017-05-31 DIAGNOSIS — R278 Other lack of coordination: Secondary | ICD-10-CM | POA: Diagnosis not present

## 2017-05-31 DIAGNOSIS — M6281 Muscle weakness (generalized): Secondary | ICD-10-CM
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
## 2017-06-03 ENCOUNTER — Ambulatory Visit: Payer: BLUE CROSS/BLUE SHIELD | Admitting: Physical Therapy

## 2017-06-03 ENCOUNTER — Encounter: Payer: Self-pay | Admitting: Physical Therapy

## 2017-06-03 VITALS — BP 155/87 | HR 71

## 2017-06-03 DIAGNOSIS — R208 Other disturbances of skin sensation: Secondary | ICD-10-CM | POA: Diagnosis not present

## 2017-06-03 DIAGNOSIS — R278 Other lack of coordination: Secondary | ICD-10-CM | POA: Diagnosis not present

## 2017-06-03 DIAGNOSIS — R262 Difficulty in walking, not elsewhere classified: Secondary | ICD-10-CM | POA: Diagnosis not present

## 2017-06-03 DIAGNOSIS — R2681 Unsteadiness on feet: Secondary | ICD-10-CM

## 2017-06-03 DIAGNOSIS — R42 Dizziness and giddiness: Secondary | ICD-10-CM | POA: Diagnosis not present

## 2017-06-03 DIAGNOSIS — M6281 Muscle weakness (generalized): Secondary | ICD-10-CM | POA: Diagnosis not present
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
## 2017-06-05 ENCOUNTER — Ambulatory Visit: Payer: BLUE CROSS/BLUE SHIELD | Attending: Neurology | Admitting: Rehabilitation

## 2017-06-05 ENCOUNTER — Encounter: Payer: Self-pay | Admitting: Rehabilitation

## 2017-06-05 VITALS — BP 146/85

## 2017-06-05 DIAGNOSIS — R2681 Unsteadiness on feet: Secondary | ICD-10-CM | POA: Diagnosis not present

## 2017-06-05 DIAGNOSIS — M6281 Muscle weakness (generalized): Secondary | ICD-10-CM | POA: Diagnosis not present

## 2017-06-05 DIAGNOSIS — R262 Difficulty in walking, not elsewhere classified: Secondary | ICD-10-CM | POA: Diagnosis not present

## 2017-06-05 DIAGNOSIS — R208 Other disturbances of skin sensation: Secondary | ICD-10-CM | POA: Diagnosis not present

## 2017-06-05 NOTE — Patient Instructions (Signed)
Hamstring Stretch    Sitting with operated leg straight on bed, and foot of other leg on floor, lean forward toward toes of straight leg. Hold _30-45___ seconds. Repeat _2-3___ times. Do _2-3___ sessions per day.  http://gt2.exer.us/302   Copyright  VHI. All rights reserved.   Knee-to-Chest Stretch: Bilateral    With hands behind knees, pull both knees in to chest until a comfortable stretch is felt in lower back and buttocks. Keep back relaxed. Hold ___30-45_ seconds. Repeat __2-3__ times per set. Do __1__ sets per session. Do __2__ sessions per day.  http://orth.exer.us/128   Copyright  VHI. All rights reserved.   Quadricep Stretch    Holding support, grasp right ankle. Gently pull foot toward buttocks until a stretch is felt on the upper front of leg. Hold _30___ seconds. Repeat ___2-3_ times. Do __2__ sessions per day.  http://gt2.exer.us/277   Copyright  VHI. All rights reserved.
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
## 2017-06-10 ENCOUNTER — Ambulatory Visit: Payer: BLUE CROSS/BLUE SHIELD | Admitting: Physical Therapy

## 2017-06-10 ENCOUNTER — Encounter: Payer: Self-pay | Admitting: Physical Therapy

## 2017-06-10 VITALS — BP 122/76 | HR 80

## 2017-06-10 DIAGNOSIS — R2681 Unsteadiness on feet: Secondary | ICD-10-CM | POA: Diagnosis not present

## 2017-06-10 DIAGNOSIS — R208 Other disturbances of skin sensation: Secondary | ICD-10-CM

## 2017-06-10 DIAGNOSIS — R262 Difficulty in walking, not elsewhere classified: Secondary | ICD-10-CM

## 2017-06-10 DIAGNOSIS — M6281 Muscle weakness (generalized): Secondary | ICD-10-CM
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
## 2017-06-12 ENCOUNTER — Encounter: Payer: Self-pay | Admitting: Physical Therapy

## 2017-06-12 ENCOUNTER — Ambulatory Visit: Payer: BLUE CROSS/BLUE SHIELD | Admitting: Physical Therapy

## 2017-06-12 DIAGNOSIS — R2681 Unsteadiness on feet: Secondary | ICD-10-CM | POA: Diagnosis not present

## 2017-06-12 DIAGNOSIS — M6281 Muscle weakness (generalized): Secondary | ICD-10-CM

## 2017-06-12 DIAGNOSIS — R208 Other disturbances of skin sensation: Secondary | ICD-10-CM | POA: Diagnosis not present

## 2017-06-12 DIAGNOSIS — R262 Difficulty in walking, not elsewhere classified: Secondary | ICD-10-CM | POA: Diagnosis not present
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
## 2017-06-21 ENCOUNTER — Encounter: Payer: Self-pay | Admitting: Rehabilitation

## 2017-06-21 ENCOUNTER — Ambulatory Visit: Payer: BLUE CROSS/BLUE SHIELD | Admitting: Rehabilitation

## 2017-06-21 DIAGNOSIS — M6281 Muscle weakness (generalized): Secondary | ICD-10-CM | POA: Diagnosis not present

## 2017-06-21 DIAGNOSIS — R262 Difficulty in walking, not elsewhere classified: Secondary | ICD-10-CM

## 2017-06-21 DIAGNOSIS — R2681 Unsteadiness on feet: Secondary | ICD-10-CM | POA: Diagnosis not present

## 2017-06-21 DIAGNOSIS — R208 Other disturbances of skin sensation: Secondary | ICD-10-CM

## 2017-06-21 NOTE — Patient Instructions (Signed)
SINGLE LIMB STANCE    Stand on LEFT leg.  Try for 10 seconds.   Copyright  VHI. All rights reserved.   Wall Squat    Feet shoulder width apart, __12__ inches in front of wall, lean against wall. Heels on floor, knees parallel, bend hips and knees to almost 90. Hold __5__ seconds. Lower slowly, explode on return. Repeat __5__ times. Do __2__ sessions per day.  Copyright  VHI. All rights reserved.
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
## 2017-06-28 ENCOUNTER — Other Ambulatory Visit: Payer: Self-pay | Admitting: Medical

## 2017-06-28 ENCOUNTER — Encounter: Payer: Self-pay | Admitting: Medical

## 2017-06-28 ENCOUNTER — Ambulatory Visit: Payer: BLUE CROSS/BLUE SHIELD | Admitting: Rehabilitation

## 2017-06-28 ENCOUNTER — Ambulatory Visit: Payer: BLUE CROSS/BLUE SHIELD | Admitting: Medical

## 2017-06-28 ENCOUNTER — Telehealth: Payer: Self-pay | Admitting: Medical

## 2017-06-28 VITALS — BP 130/70 | HR 71 | Temp 98.5°F | Ht 68.0 in | Wt 213.2 lb

## 2017-06-28 DIAGNOSIS — I6300 Cerebral infarction due to thrombosis of unspecified precerebral artery: Secondary | ICD-10-CM

## 2017-06-28 DIAGNOSIS — I10 Essential (primary) hypertension: Secondary | ICD-10-CM | POA: Diagnosis not present

## 2017-06-28 DIAGNOSIS — E785 Hyperlipidemia, unspecified: Secondary | ICD-10-CM

## 2017-06-28 DIAGNOSIS — E1165 Type 2 diabetes mellitus with hyperglycemia: Secondary | ICD-10-CM

## 2017-06-28 LAB — HEMOGLOBIN A1C
ESTIMATED AVERAGE GLUCOSE: 258 mg/dL
HEMOGLOBIN A1C: 10.6 % — AB (ref 4.8–5.6)

## 2017-06-28 LAB — COMPREHENSIVE METABOLIC PANEL
A/G RATIO: 1.9 (ref 1.2–2.2)
ALBUMIN: 4.4 g/dL (ref 3.6–4.8)
ALK PHOS: 71 IU/L (ref 39–117)
ALT: 16 IU/L (ref 0–44)
AST: 10 IU/L (ref 0–40)
BILIRUBIN TOTAL: 0.4 mg/dL (ref 0.0–1.2)
BUN/Creatinine Ratio: 27 — ABNORMAL HIGH (ref 10–24)
BUN: 19 mg/dL (ref 8–27)
CHLORIDE: 102 mmol/L (ref 96–106)
CO2: 19 mmol/L — ABNORMAL LOW (ref 20–29)
Calcium: 9.4 mg/dL (ref 8.6–10.2)
Creatinine, Ser: 0.7 mg/dL — ABNORMAL LOW (ref 0.76–1.27)
GFR calc Af Amer: 115 mL/min/{1.73_m2} (ref >59)
GFR calc non Af Amer: 100 mL/min/{1.73_m2} (ref >59)
GLUCOSE: 332 mg/dL — AB (ref 65–99)
Globulin, Total: 2.3 g/dL (ref 1.5–4.5)
POTASSIUM: 4.5 mmol/L (ref 3.5–5.2)
Sodium: 134 mmol/L (ref 134–144)
TOTAL PROTEIN: 6.7 g/dL (ref 6.0–8.5)

## 2017-06-28 LAB — LIPID PANEL
CHOL/HDL RATIO: 3.9 ratio (ref 0.0–5.0)
Cholesterol, Total: 122 mg/dL (ref 100–199)
HDL: 31 mg/dL — ABNORMAL LOW (ref >39)
LDL Calculated: 74 mg/dL (ref 0–99)
Triglycerides: 85 mg/dL (ref 0–149)
VLDL CHOLESTEROL CAL: 17 mg/dL (ref 5–40)

## 2017-06-28 MED ORDER — PEN NEEDLES 32G X 4 MM MISC: 180.0000 | Freq: Two times a day (BID) | 11 refills | Status: DC

## 2017-06-28 MED ORDER — INSULIN ASPART 100 UNIT/ML FLEXPEN: 7.0000 [IU] | PEN_INJECTOR | Freq: Three times a day (TID) | SUBCUTANEOUS | 5 refills | Status: DC

## 2017-06-28 MED ORDER — INSULIN GLARGINE 100 UNIT/ML SOLOSTAR PEN: 40.0000 [IU] | PEN_INJECTOR | Freq: Every day | SUBCUTANEOUS | 3 refills | Status: DC

## 2017-06-28 MED ORDER — METFORMIN HCL 500 MG PO TABS: 500.0000 mg | ORAL_TABLET | Freq: Two times a day (BID) | ORAL | 3 refills | Status: DC

## 2017-06-28 MED ORDER — CLOPIDOGREL BISULFATE 75 MG PO TABS: 75.0000 mg | ORAL_TABLET | Freq: Every day | ORAL | 3 refills | Status: DC

## 2017-06-28 MED ORDER — SILVER SULFADIAZINE 1 % EX CREA
1.0000 | TOPICAL_CREAM | Freq: Every day | CUTANEOUS | 0 refills | Status: DC
Start: 2017-06-28 — End: 2017-09-22

## 2017-06-28 MED ORDER — INSULIN LISPRO 100 UNIT/ML (KWIKPEN): 7.0000 [IU] | PEN_INJECTOR | Freq: Three times a day (TID) | SUBCUTANEOUS | 11 refills | Status: DC

## 2017-06-28 NOTE — Telephone Encounter (Signed)
Per pharmacy this med is not covered and to see in another alternative

## 2017-06-28 NOTE — Telephone Encounter (Signed)
Pt called & needed refill strips but didn't know what meter he has.  He says he got the meter from [facility].  I called [facility] and they had no record of him getting the meter there.  I went ahead & called in strips and lancets to have on file and called pt back & he will take his meter to [facility] and they will give him whichever strips he needs for his meter.

## 2017-06-28 NOTE — Patient Instructions (Signed)
Recommendations:  Continue your current medications  Make sure you are taking the Metformin pill twice daily for diabetes  You can continue Aspirin    Increase Lantus long acting insulin to 30 units at night.   Continue to increase 2 units per week until your fasting morning sugars are <130.  This may take several weeks to get to this point  Increase Humalog fast acting insulin to 7 units, three times daily with meals  Check your sugar every morning fasting.  Check your sugar anytime you feel sweaty, weak, or not feeling good to make sure your sugars aren't <70 and are not over 400.  Call in 2 weeks to let me know what average numbers you are seeing

## 2017-06-28 NOTE — Progress Notes (Signed)
Subjective: Chief Complaint  Patient presents with  . Diabetes    level running high/pt not sure if he's been taking his metformin    Here for recheck.   I saw him as a new patient 04/2017  He has his glucometer today and all of his readings are over 340 from last visit.  His son has been helping him with his medications as he cannot read or write.  He apparently has not been taking the Metformin.  He is up to 22 units of Lantus nightly, he is doing 5 units of Humalog twice daily with breakfast and dinner.  He is compliant with cholesterol medicine and aspirin.   Due to his stroke and numbness of the left arm, he burned his left forearm when he was changing the oil on a car recently this past week.  He is using antibiotic ointment on the wounds.  He has no other new complaints  Past Medical History:  Diagnosis Date  . ANXIETY 07/04/2007  . ASTHMATIC BRONCHITIS, ACUTE 12/19/2007  . DIABETES MELLITUS, TYPE II 09/28/2006  . ERECTILE DYSFUNCTION 09/28/2006  . GERD 09/28/2006  . Hematochezia 05/29/2010  . HYPERLIPIDEMIA 09/28/2006  . HYPERTENSION 09/28/2006  . Illiterate 09/11/2012  . LOW BACK PAIN 09/28/2006  . OBESITY 09/28/2006  . PLANTAR FASCIITIS, BILATERAL 07/04/2007  . Stroke [facility])     Current Outpatient Medications on File Prior to Visit  Medication Sig Dispense Refill  . aspirin 81 MG EC tablet Take 1 tablet (81 mg total) by mouth daily. 90 tablet 3  . atorvastatin (LIPITOR) 80 MG tablet Take 1 tablet (80 mg total) by mouth daily at 6 PM. 90 tablet 1  . blood glucose meter kit and supplies KIT Check blood sugar up to four times daily. Once in the morning and three times daily with meals. (FOR ICD-9 250.00, 250.01). 1 each 0  . ondansetron (ZOFRAN) 8 MG tablet Take 1 tablet (8 mg total) by mouth every 8 (eight) hours as needed for nausea or vomiting. 20 tablet 1   No current facility-administered medications on file prior to visit.    ROS as in subjective    Objective: BP 130/70    Pulse 71   Temp 98.5 F (36.9 C) (Oral)   Ht _0  (1.727 m)   Wt 213 lb 3.2 oz (96.7 kg)   SpO2 98%   BMI 32.42 kg/m    General appearance: alert, no distress, WD/WN, white male There are two 3 cm triangular burn wounds of the left forearm posteriorly distal third and over hand between first finger and second finger, healing appropriately, no pus or drainage, no induration, no fluctuance Heart: RRR, normal S1, S2, no murmurs Lungs: CTA bilaterally, no wheezes, rhonchi, or rales Extremities: no edema, no cyanosis, no clubbing Pulses: 2+ symmetric, upper and lower extremities, normal cap refill Neurological: mild finger to nose ataxia on right, stance is broad, slightly ataxic gait, otherwise alert, oriented x 3, CN2-12 intact, strength normal upper extremities and lower extremities, sensation normal throughout, DTRs 2+ throughout Psychiatric: normal affect, behavior normal, pleasant    Assessment: Encounter Diagnoses  Name Primary?  . Cerebrovascular accident (CVA) due to thrombosis of precerebral artery ([place]) Yes  . Uncontrolled type 2 diabetes mellitus with hyperglycemia ([place])   . Hyperlipidemia, unspecified hyperlipidemia type   . Essential hypertension, benign      Plan: We reviewed his recent glucose readings.   He has burn wounds of left forearm.  Recommend he keep the wounds  clean with soap and water, begin Silvadene cream twice daily for the next week to help the wound healing.  No signs of infection currently.  Given his left arm numbness status post stroke, recommend he be particularly careful such an event in this case where he burned his hand due to not being able to feel the wound  He seems to be handling his medicine regimen okay but sugars are way too high.  I increased his Lantus and Humalog today, discussed the recommendations below   Patient Instructions  Recommendations:  Continue your current medications  Make sure you are taking the Metformin pill twice  daily for diabetes  You can continue Aspirin 38m   Increase Lantus long acting insulin to 30 units at night.   Continue to increase 2 units per week until your fasting morning sugars are <130.  This may take several weeks to get to this point  Increase Humalog fast acting insulin to 7 units, three times daily with meals  Check your sugar every morning fasting.  Check your sugar anytime you feel sweaty, weak, or not feeling good to make sure your sugars aren't <70 and are not over 400.  Call in 2 weeks to let me know what average numbers you are seeing   He voices understanding and agreement of plan.  RArizwas seen today for diabetes.  Diagnoses and all orders for this visit:  Cerebrovascular accident (CVA) due to thrombosis of precerebral artery ([place] -     Hemoglobin A1c -     Lipid panel -     Comprehensive metabolic panel  Uncontrolled type 2 diabetes mellitus with hyperglycemia (HCC) -     Hemoglobin A1c -     Lipid panel -     Comprehensive metabolic panel  Hyperlipidemia, unspecified hyperlipidemia type -     Hemoglobin A1c -     Lipid panel -     Comprehensive metabolic panel  Essential hypertension, benign -     Hemoglobin A1c -     Lipid panel -     Comprehensive metabolic panel  Other orders -     clopidogrel (PLAVIX) 75 MG tablet; Take 1 tablet (75 mg total) by mouth daily. -     Insulin Glargine (LANTUS SOLOSTAR) 100 UNIT/ML Solostar Pen; Inject 40 Units into the skin daily at 10 pm. -     Insulin Pen Needle (PEN NEEDLES) 32G X 4 MM MISC; 180 each by Does not apply route 2 (two) times daily. -     metFORMIN (GLUCOPHAGE) 500 MG tablet; Take 1 tablet (500 mg total) by mouth 2 (two) times daily with a meal. -     insulin lispro (HUMALOG KWIKPEN) 100 UNIT/ML KiwkPen; Inject 0.07 mLs (7 Units total) into the skin 3 (three) times daily. -     silver sulfADIAZINE (SILVADENE) 1 % cream; Apply 1 application topically daily.

## 2017-07-16 NOTE — Progress Notes (Deleted)
[facility] [address] [place]. [place] [address] [phone]       OFFICE FOLLOW-UP NOTE  John Roman Date of Birth:  [birth year] Medical Record Number:  [id number]   HPI: Mr Whitenack is a 68 year Caucasian male seen today for first office follow-up visit following hospital admission for stroke in March 2019. History is obtained from the patient, his wife and daughter who accompany him today as well as review of electronic medical records. I have personally reviewed imaging films.John Roman is an 62 y.o. male with hypertension, diabetes, hyperlipidemia.  States that this past Tuesday he had an episode of vertigo nausea vomiting however this subsided and thus he felt it was just flu.  However last night patient went to bed at approximately 2300 hrs. and felt fine.  He got up at 0100 hrs. and attempted to go to the bathroom but Leaning to the side in fact fell to the floor.  Patient felt his room spinning but cannot tell me which way it was going.  In addition to this he was nauseous and did vomit.  Patient was brought to the emergency department where he was found in DKA and stroke was suspected.  CT of head was obtained and did not show any intracranial abnormalities.  However exam still shows abnormalities that are indicative of stroke.  Patient was not a TPA candidate as he was out of the window and this does not appear to be a large vessel occlusion and thus he is not a interventional radiology. Date last known well: Date: 04/04/2017 Time last known well: Time: 22:00 tPA Given: No: Out of the window Modified Rankin prior to stroke: Rankin Score=0. CT scan of the head showed low density in the right occipital, right mid superior cerebellar and parieto-occipital junction as well as right frontoparietal junction compatible with acute infarcts. MRI scan of the brain confirmed acute infarcts in these locations including the right posterior to medulla. The distal right vertebral  artery was occluded in the V3 segment. Was also short segment high-grade stenosis of the proximal right internal carotid artery on the MRA of the neck. Transthoracic echo showed normal ejection fraction. LDL cholesterol is elevated at 191 mg percent and hemoglobin A1c at 12.5. Patient was started on dual antiplatelet therapy aspirin and Plavix as well as Lipitor 80 mg. The patient states he is doing better his double vision has improved. Dizziness is also much better though he does get slightly dizzy and off balance when he walks quickly or makes a sudden turn. His swallowing is also much improved. The patient has not Seen a primary care doctor but plans to see one soon. He is tolerating aspirin and Plavix without bleeding or bruising. Patient has not been referred to outpatient physical occupational therapy yet. The patient is on Glucophage for his diabetes and needs to see a medical doctor and have the dose adjusted. He has no prior history of strokes or TIAs. During the hospitalization TEE and prolonged cardiac monitoring were considered but not done given his poor compliance and lack of follow-up and he was very uncooperative for testing in the hospital.  07/17/17 UPDATE:    ROS:   14 system review of systems is positive for  blurred vision, cough, excessive thirst, gait ataxia, dizziness, imbalance, dysphagia and all other systems negative  PMH:  Past Medical History:  Diagnosis Date  . ANXIETY 07/04/2007  . ASTHMATIC BRONCHITIS, ACUTE 12/19/2007  . DIABETES MELLITUS, TYPE II 09/28/2006  .  ERECTILE DYSFUNCTION 09/28/2006  . GERD 09/28/2006  . Hematochezia 05/29/2010  . HYPERLIPIDEMIA 09/28/2006  . HYPERTENSION 09/28/2006  . Illiterate 09/11/2012  . LOW BACK PAIN 09/28/2006  . OBESITY 09/28/2006  . PLANTAR FASCIITIS, BILATERAL 07/04/2007  . Stroke [facility])     Social History:  Social History   Socioeconomic History  . Marital status: Married    Spouse name: Not on file  . Number of children: 1    . Years of education: Not on file  . Highest education level: Not on file  Occupational History  . Occupation: Clinical biochemist  Social Needs  . Financial resource strain: Not on file  . Food insecurity:    Worry: Not on file    Inability: Not on file  . Transportation needs:    Medical: Not on file    Non-medical: Not on file  Tobacco Use  . Smoking status: Former Research scientist (life sciences)  . Smokeless tobacco: Never Used  Substance and Sexual Activity  . Alcohol use: No  . Drug use: Yes    Types: Marijuana    Comment: occasional marijanua use anytime he can  . Sexual activity: Yes  Lifestyle  . Physical activity:    Days per week: Not on file    Minutes per session: Not on file  . Stress: Not on file  Relationships  . Social connections:    Talks on phone: Not on file    Gets together: Not on file    Attends religious service: Not on file    Active member of club or organization: Not on file    Attends meetings of clubs or organizations: Not on file    Relationship status: Not on file  . Intimate partner violence:    Fear of current or ex partner: Not on file    Emotionally abused: Not on file    Physically abused: Not on file    Forced sexual activity: Not on file  Other Topics Concern  . Not on file  Social History Narrative  . Not on file    Medications:   Current Outpatient Medications on File Prior to Visit  Medication Sig Dispense Refill  . aspirin 81 MG EC tablet Take 1 tablet (81 mg total) by mouth daily. 90 tablet 3  . atorvastatin (LIPITOR) 80 MG tablet Take 1 tablet (80 mg total) by mouth daily at 6 PM. 90 tablet 1  . blood glucose meter kit and supplies KIT Check blood sugar up to four times daily. Once in the morning and three times daily with meals. (FOR ICD-9 250.00, 250.01). 1 each 0  . clopidogrel (PLAVIX) 75 MG tablet Take 1 tablet (75 mg total) by mouth daily. 90 tablet 3  . insulin aspart (NOVOLOG FLEXPEN) 100 UNIT/ML FlexPen Inject 7 Units into the skin 3 (three)  times daily with meals. 15 mL 5  . Insulin Glargine (LANTUS SOLOSTAR) 100 UNIT/ML Solostar Pen Inject 40 Units into the skin daily at 10 pm. 9 pen 3  . insulin lispro (HUMALOG KWIKPEN) 100 UNIT/ML KiwkPen Inject 0.07 mLs (7 Units total) into the skin 3 (three) times daily. 15 mL 11  . Insulin Pen Needle (PEN NEEDLES) 32G X 4 MM MISC 180 each by Does not apply route 2 (two) times daily. 200 each 11  . metFORMIN (GLUCOPHAGE) 500 MG tablet Take 1 tablet (500 mg total) by mouth 2 (two) times daily with a meal. 180 tablet 3  . ondansetron (ZOFRAN) 8 MG tablet Take 1 tablet (8 mg  total) by mouth every 8 (eight) hours as needed for nausea or vomiting. 20 tablet 1  . silver sulfADIAZINE (SILVADENE) 1 % cream Apply 1 application topically daily. 50 g 0   No current facility-administered medications on file prior to visit.     Allergies:  No Known Allergies  Physical Exam General: well developed, well nourished middle-age Caucasian male, seated, in no evident distress Head: head normocephalic and atraumatic.  Neck: supple with no carotid or supraclavicular bruits Cardiovascular: regular rate and rhythm, no murmurs Musculoskeletal: no deformity Skin:  no rash/petichiae Vascular:  Normal pulses all extremities There were no vitals filed for this visit. Neurologic Exam Mental Status: Awake and fully alert. Oriented to place and time. Recent and remote memory intact. Attention span, concentration and fund of knowledge appropriate. Mood and affect appropriate.  Cranial Nerves: Fundoscopic exam reveals sharp disc margins. Pupils equal, briskly reactive to light. Extraocular movements full without nystagmus but saccadic dysmetria while looking to the right.. Visual fields full to confrontation. Hearing intact. Facial sensation intact. Face, tongue, palate moves normally and symmetrically.  Motor: Normal bulk and tone. Normal strength in all tested extremity muscles. Sensory.: intact to touch ,pinprick  .position and vibratory sensation.  Coordination: Rapid alternating movements normal in all extremities. Mild Finger-to-nose and heel-to-shin ataxia on the right Gait and Station: Arises from chair without difficulty. Stance is broad-based. Slightly ataxic gait. Leans to the right. Unable to walk tandem..  Reflexes: 1+ and symmetric. Toes downgoing.   NIHSS  2 Modified Rankin  2   ASSESSMENT: 62 year old Caucasian male with right cerebellar and medullary infarct in March 2019 secondary to right vertebral artery occlusion. Also silent right parietal white matter infarct. Vascular risk factors of uncontrolled diabetes, hypertension, hyperlipidemia and internal extracranial atherosclerosis.    PLAN: -Continue aspirin 81 mg daily  and ***  for secondary stroke prevention -F/u with PCP regarding your *** management -continue to monitor BP at home  -Maintain strict control of hypertension with blood pressure goal below 130/90, diabetes with hemoglobin A1c goal below 6.5% and cholesterol with LDL cholesterol (bad cholesterol) goal below 70 mg/dL. I also advised the patient to eat a healthy diet with plenty of whole grains, cereals, fruits and vegetables, exercise regularly and maintain ideal body weight.  Follow up in *** or call earlier if needed  Greater than 50% of time during this 25 minute visit was spent on counseling,explanation of diagnosis of ***, reviewing risk factor management of ***, planning of further management, discussion with patient and family and coordination of care  Venancio Poisson, [facility]  [facility] [address] [place], [place] [address]  Phone [phone] Fax [phone]

## 2017-07-17 ENCOUNTER — Ambulatory Visit: Payer: BLUE CROSS/BLUE SHIELD | Admitting: Adult Health

## 2017-07-17 ENCOUNTER — Telehealth: Payer: Self-pay

## 2017-07-17 ENCOUNTER — Encounter: Payer: Self-pay | Admitting: Adult Health

## 2017-07-17 NOTE — Telephone Encounter (Signed)
Patient was a no show for his appt with Shanda BumpsJessica, NP today.

## 2017-07-29 ENCOUNTER — Other Ambulatory Visit: Payer: Self-pay | Admitting: Internal Medicine

## 2017-08-09 ENCOUNTER — Encounter: Payer: Self-pay | Admitting: Rehabilitation

## 2017-08-09 NOTE — Therapy (Signed)
[facility] [address] [place], [place], [address] Phone: [phone]   Fax:  [phone]  Patient Details  Name: John Roman MRN: [id number] Date of Birth: [birth year] Referring Provider:  No ref. provider found  Encounter Date: 08/09/2017    PHYSICAL THERAPY DISCHARGE SUMMARY  Visits from Start of Care: 8  Current functional level related to goals / functional outcomes: PT Long Term Goals - 06/21/17 0951      PT LONG TERM GOAL #1   Title  Pt will be independent with HEP with use of pictures (pt unable to read)    Baseline  performing current HEP independently    Time  4    Period  Weeks    Status  Revised      PT LONG TERM GOAL #2   Title  Pt will improve BERG balance score to >/= 52/56 to decrease falls risk    Baseline  40/56 > 51/56> 52/56 on     Time  4    Period  Weeks    Status  Achieved      PT LONG TERM GOAL #3   Title  Pt will improve DGI score to >/= 22/24 for decreased falls risk during gait with head turns    Baseline  16/24 > 21/24> 24/24 on 06/21/17    Time  4    Period  Weeks    Status  Achieved      PT LONG TERM GOAL #5   Title  Pt will improve endurance on treadmill to be able to ambulate x 30 minutes (per report at home)    Baseline  10-15 min at a time    Time  4    Period  Weeks    Status  Revised         Remaining deficits: Pt has high level balance deficits, given HEP to address along with walking program on treadmill for continued fitness.    Education / Equipment: HEP  Plan: Patient agrees to discharge.  Patient goals were met. Patient is being discharged due to meeting the stated rehab goals.  ?????       Cameron Sprang, PT, MPT [facility] [address] [place], [place], [address] Phone: [phone]   Fax:  [phone] 08/09/17, 1:53 PM

## 2017-09-22 ENCOUNTER — Other Ambulatory Visit: Payer: Self-pay | Admitting: Medical

## 2017-09-23 NOTE — Telephone Encounter (Signed)
Is this ok to refill?  

## 2017-10-22 ENCOUNTER — Encounter: Payer: Self-pay | Admitting: Medical

## 2017-10-22 ENCOUNTER — Other Ambulatory Visit: Payer: Self-pay | Admitting: Medical

## 2017-10-22 ENCOUNTER — Ambulatory Visit: Payer: BLUE CROSS/BLUE SHIELD | Admitting: Medical

## 2017-10-22 VITALS — BP 138/80 | HR 72 | Temp 97.8°F | Resp 16 | Ht 69.0 in | Wt 225.8 lb

## 2017-10-22 DIAGNOSIS — R059 Cough, unspecified: Secondary | ICD-10-CM

## 2017-10-22 DIAGNOSIS — R05 Cough: Secondary | ICD-10-CM

## 2017-10-22 DIAGNOSIS — E785 Hyperlipidemia, unspecified: Secondary | ICD-10-CM

## 2017-10-22 DIAGNOSIS — I6300 Cerebral infarction due to thrombosis of unspecified precerebral artery: Secondary | ICD-10-CM

## 2017-10-22 DIAGNOSIS — Z719 Counseling, unspecified: Secondary | ICD-10-CM

## 2017-10-22 DIAGNOSIS — E1165 Type 2 diabetes mellitus with hyperglycemia: Secondary | ICD-10-CM | POA: Diagnosis not present

## 2017-10-22 DIAGNOSIS — I10 Essential (primary) hypertension: Secondary | ICD-10-CM

## 2017-10-22 DIAGNOSIS — K219 Gastro-esophageal reflux disease without esophagitis: Secondary | ICD-10-CM | POA: Diagnosis not present

## 2017-10-22 MED ORDER — PANTOPRAZOLE SODIUM 40 MG PO TBEC: DELAYED_RELEASE_TABLET | ORAL | 0 refills | Status: DC

## 2017-10-22 NOTE — Progress Notes (Signed)
Subjective:  John Roman is a 62 y.o. male who presents for Chief Complaint  Patient presents with  . med check    med check      Here for med check.  He has ongoing cough and phlegm, quit smoking 37 years ago.  Occasionally vomits.  Diabetes, checking sugars, getting 200 and 300 blood sugar numbers regularly.  Using Lantus 32 units at night, mealtime insulin 14 units 3 times a day.  He declines colonoscopy or endoscopy as he said complications from colonoscopy killed his father  He has no new concerns otherwise   no other aggravating or relieving factors.    No other c/o.  Past Medical History:  Diagnosis Date  . ANXIETY 07/04/2007  . ASTHMATIC BRONCHITIS, ACUTE 12/19/2007  . DIABETES MELLITUS, TYPE II 09/28/2006  . ERECTILE DYSFUNCTION 09/28/2006  . GERD 09/28/2006  . Hematochezia 05/29/2010  . HYPERLIPIDEMIA 09/28/2006  . HYPERTENSION 09/28/2006  . Illiterate 09/11/2012  . LOW BACK PAIN 09/28/2006  . OBESITY 09/28/2006  . PLANTAR FASCIITIS, BILATERAL 07/04/2007  . Stroke [facility])    Current Outpatient Medications on File Prior to Visit  Medication Sig Dispense Refill  . aspirin 81 MG EC tablet Take 1 tablet (81 mg total) by mouth daily. 90 tablet 3  . blood glucose meter kit and supplies KIT Check blood sugar up to four times daily. Once in the morning and three times daily with meals. (FOR ICD-9 250.00, 250.01). 1 each 0  . clopidogrel (PLAVIX) 75 MG tablet Take 1 tablet (75 mg total) by mouth daily. 90 tablet 3  . insulin aspart (NOVOLOG FLEXPEN) 100 UNIT/ML FlexPen Inject 7 Units into the skin 3 (three) times daily with meals. 15 mL 5  . Insulin Glargine (LANTUS SOLOSTAR) 100 UNIT/ML Solostar Pen Inject 40 Units into the skin daily at 10 pm. 9 pen 3  . insulin lispro (HUMALOG KWIKPEN) 100 UNIT/ML KiwkPen Inject 0.07 mLs (7 Units total) into the skin 3 (three) times daily. 15 mL 11  . Insulin Pen Needle (PEN NEEDLES) 32G X 4 MM MISC 180 each by Does not apply route 2 (two) times  daily. 200 each 11  . metFORMIN (GLUCOPHAGE) 500 MG tablet Take 1 tablet (500 mg total) by mouth 2 (two) times daily with a meal. 180 tablet 3  . ondansetron (ZOFRAN) 8 MG tablet Take 1 tablet (8 mg total) by mouth every 8 (eight) hours as needed for nausea or vomiting. (Patient not taking: Reported on 10/22/2017) 20 tablet 1  . silver sulfADIAZINE (SILVADENE) 1 % cream APPLY TO AFFECTED AREA EVERY DAY (Patient not taking: Reported on 10/22/2017) 50 g 0   No current facility-administered medications on file prior to visit.    The following portions of the patient's history were reviewed and updated as appropriate: allergies, current medications, past family history, past medical history, past social history, past surgical history and problem list.  ROS Otherwise as in subjective above  Objective: BP 138/80   Pulse 72   Temp 97.8 F (36.6 C) (Oral)   Resp 16   Ht 5' 9"  (1.753 m)   Wt 225 lb 12.8 oz (102.4 kg)   SpO2 96%   BMI 33.34 kg/m   General appearance: alert, no distress, well developed, well nourished Oral cavity: MMM, no lesions Neck: supple, no lymphadenopathy, no thyromegaly, no masses Heart: RRR, normal S1, S2, no murmurs Lungs: CTA bilaterally, no wheezes, rhonchi, or rales Abdomen: +bs, soft, non tender, non distended, no masses, no hepatomegaly, no  splenomegaly Pulses: 1+ radial pulses, 1+ pedal pulses, normal cap refill Ext: no edema   Assessment: Encounter Diagnoses  Name Primary?  Marland Kitchen Uncontrolled type 2 diabetes mellitus with hyperglycemia ([place]) Yes  . Cerebrovascular accident (CVA) due to thrombosis of precerebral artery ([place])   . Essential hypertension, benign   . Gastroesophageal reflux disease, esophagitis presence not specified   . Hyperlipidemia, unspecified hyperlipidemia type   . Cough   . Health education/counseling      Plan: Diabetes not at goal, pending labs likely referral to endocrinology and modification medication.  He endorses eating  pretty unhealthy diet today.  Counseled on diet, exercise  History of CVA-discussed need to work on better lifestyle changes  Hypertension-blood pressures look okay without medication currently  Hyperlipidemia-continue current medication  GERD, cough- begin trial of Protonix, avoid reflux triggers  Of note recommended endoscopy and colonoscopy but he declines  John Roman was seen today for med check.  Diagnoses and all orders for this visit:  Uncontrolled type 2 diabetes mellitus with hyperglycemia ([place]) -     Comprehensive metabolic panel -     Hemoglobin A1c  Cerebrovascular accident (CVA) due to thrombosis of precerebral artery (HCC) -     Comprehensive metabolic panel -     Hemoglobin A1c  Essential hypertension, benign  Gastroesophageal reflux disease, esophagitis presence not specified  Hyperlipidemia, unspecified hyperlipidemia type  Cough  Health education/counseling  Other orders -     pantoprazole (PROTONIX) 40 MG tablet; 1 tablet daily po 45 min prior to breakfast    Follow up: pending labs

## 2017-10-23 ENCOUNTER — Telehealth: Payer: Self-pay

## 2017-10-23 DIAGNOSIS — R05 Cough: Secondary | ICD-10-CM | POA: Insufficient documentation

## 2017-10-23 DIAGNOSIS — R059 Cough, unspecified: Secondary | ICD-10-CM | POA: Insufficient documentation

## 2017-10-23 LAB — COMPREHENSIVE METABOLIC PANEL
A/G RATIO: 1.8 (ref 1.2–2.2)
ALK PHOS: 70 IU/L (ref 39–117)
ALT: 21 IU/L (ref 0–44)
AST: 18 IU/L (ref 0–40)
Albumin: 4.4 g/dL (ref 3.6–4.8)
BILIRUBIN TOTAL: 0.3 mg/dL (ref 0.0–1.2)
BUN / CREAT RATIO: 21 (ref 10–24)
BUN: 16 mg/dL (ref 8–27)
CO2: 20 mmol/L (ref 20–29)
CREATININE: 0.76 mg/dL (ref 0.76–1.27)
Calcium: 9.6 mg/dL (ref 8.6–10.2)
Chloride: 103 mmol/L (ref 96–106)
GFR, EST AFRICAN AMERICAN: 111 mL/min/{1.73_m2} (ref >59)
GFR, EST NON AFRICAN AMERICAN: 96 mL/min/{1.73_m2} (ref >59)
GLOBULIN, TOTAL: 2.4 g/dL (ref 1.5–4.5)
Glucose: 209 mg/dL — ABNORMAL HIGH (ref 65–99)
Potassium: 4.4 mmol/L (ref 3.5–5.2)
Sodium: 139 mmol/L (ref 134–144)
Total Protein: 6.8 g/dL (ref 6.0–8.5)

## 2017-10-23 LAB — HEMOGLOBIN A1C
Est. average glucose Bld gHb Est-mCnc: 249 mg/dL
Hgb A1c MFr Bld: 10.3 % — ABNORMAL HIGH (ref 4.8–5.6)

## 2017-10-23 MED ORDER — INSULIN ASPART 100 UNIT/ML FLEXPEN: 7.0000 [IU] | PEN_INJECTOR | Freq: Three times a day (TID) | SUBCUTANEOUS | 5 refills | Status: DC

## 2017-10-23 MED ORDER — GLUCOSE BLOOD VI STRP: ORAL_STRIP | 12 refills | Status: DC

## 2017-10-23 MED ORDER — INSULIN GLARGINE 100 UNIT/ML SOLOSTAR PEN: 42.0000 [IU] | PEN_INJECTOR | Freq: Every day | SUBCUTANEOUS | 3 refills | Status: DC

## 2017-10-28 NOTE — Telephone Encounter (Signed)
Pt walked in stating his strips were still not sent to the pharmacy. Called the pharmacy and gave a verbal for Contour strips.

## 2017-11-13 ENCOUNTER — Other Ambulatory Visit: Payer: Self-pay | Admitting: Medical

## 2018-02-17 ENCOUNTER — Other Ambulatory Visit: Payer: Self-pay | Admitting: Medical

## 2018-02-19 ENCOUNTER — Telehealth: Payer: Self-pay | Admitting: Family Medicine

## 2018-02-19 NOTE — Telephone Encounter (Signed)
Patient notified

## 2018-02-19 NOTE — Telephone Encounter (Signed)
Called [facility] called in new glucose meter.

## 2018-02-19 NOTE — Telephone Encounter (Signed)
Pt son called and states pt lost his glucose meter.  He said we gave it to him.  Son does not know what brand. Please send to [facility]

## 2018-02-22 ENCOUNTER — Other Ambulatory Visit: Payer: Self-pay | Admitting: Medical

## 2018-04-21 ENCOUNTER — Other Ambulatory Visit: Payer: Self-pay

## 2018-04-21 ENCOUNTER — Ambulatory Visit: Payer: BLUE CROSS/BLUE SHIELD | Admitting: Medical

## 2018-04-21 ENCOUNTER — Encounter: Payer: Self-pay | Admitting: Medical

## 2018-04-21 VITALS — BP 150/70 | HR 71 | Resp 16 | Wt 227.4 lb

## 2018-04-21 DIAGNOSIS — I10 Essential (primary) hypertension: Secondary | ICD-10-CM

## 2018-04-21 DIAGNOSIS — E1165 Type 2 diabetes mellitus with hyperglycemia: Secondary | ICD-10-CM | POA: Diagnosis not present

## 2018-04-21 DIAGNOSIS — Z9119 Patient's noncompliance with other medical treatment and regimen: Secondary | ICD-10-CM

## 2018-04-21 DIAGNOSIS — I6300 Cerebral infarction due to thrombosis of unspecified precerebral artery: Secondary | ICD-10-CM

## 2018-04-21 DIAGNOSIS — E785 Hyperlipidemia, unspecified: Secondary | ICD-10-CM | POA: Diagnosis not present

## 2018-04-21 DIAGNOSIS — Z91199 Patient's noncompliance with other medical treatment and regimen due to unspecified reason: Secondary | ICD-10-CM

## 2018-04-21 MED ORDER — BASAGLAR KWIKPEN 100 UNIT/ML ~~LOC~~ SOPN: 32.0000 [IU] | PEN_INJECTOR | Freq: Every day | SUBCUTANEOUS | 0 refills | Status: DC

## 2018-04-21 MED ORDER — ASPIRIN 81 MG PO TBEC: 81.0000 mg | DELAYED_RELEASE_TABLET | Freq: Every day | ORAL | 3 refills | Status: DC

## 2018-04-21 NOTE — Progress Notes (Signed)
Subjective: Chief Complaint  Patient presents with  . med check    med check discuss medications   Here for med check.  Of note, he was reportedly rude to our front office person [name] demanding to have a mass today to protect himself from others, was not friendly about this.  In my nurse noted he was also hateful and not very cooperative on triage today  Diabetes-he notes running out of Lantus several months ago when insurance kicked over in new year.  He has a $1700 deductible to meet before insurance will pay for Lantus.  Thus he has not been taking this.  He notes that he is taking NovoLog 15 units 3 times a day.  Taking metformin.  Checks his blood sugars once to twice daily but he cannot give me a lot of numbers today other than some in the 200s.  He seems irritated he had to come in today.  He denies history of high blood pressure.  He notes that a prior doctor told him to smoke pot to help him with his anxiety and blood pressure which he does regularly  He denies any significant diet discretion.  He is active on the job, working with Chief Strategy Officer doing manual labor  He notes that he has been spending a lot of time in his hot tub  Compliant with plavis, Lipitor, aspirin  Past Medical History:  Diagnosis Date  . ANXIETY 07/04/2007  . ASTHMATIC BRONCHITIS, ACUTE 12/19/2007  . DIABETES MELLITUS, TYPE II 09/28/2006  . ERECTILE DYSFUNCTION 09/28/2006  . GERD 09/28/2006  . Hematochezia 05/29/2010  . HYPERLIPIDEMIA 09/28/2006  . HYPERTENSION 09/28/2006  . Illiterate 09/11/2012  . LOW BACK PAIN 09/28/2006  . OBESITY 09/28/2006  . PLANTAR FASCIITIS, BILATERAL 07/04/2007  . Stroke [facility])    Current Outpatient Medications on File Prior to Visit  Medication Sig Dispense Refill  . atorvastatin (LIPITOR) 80 MG tablet TAKE 1 TABLET (80 MG TOTAL) BY MOUTH DAILY AT 6 PM. 90 tablet 1  . blood glucose meter kit and supplies KIT Check blood sugar up to four times daily. Once in the morning and three  times daily with meals. (FOR ICD-9 250.00, 250.01). 1 each 0  . clopidogrel (PLAVIX) 75 MG tablet Take 1 tablet (75 mg total) by mouth daily. 90 tablet 3  . glucose blood test strip Test 4 times a day, one in the morning and 3 times daily with meals 200 each 12  . insulin aspart (NOVOLOG FLEXPEN) 100 UNIT/ML FlexPen Inject 7 Units into the skin 3 (three) times daily with meals. (Patient taking differently: Inject 15 Units into the skin 3 (three) times daily with meals. ) 15 mL 5  . Insulin Pen Needle (PEN NEEDLES) 32G X 4 MM MISC 180 each by Does not apply route 2 (two) times daily. 200 each 11  . metFORMIN (GLUCOPHAGE) 500 MG tablet Take 1 tablet (500 mg total) by mouth 2 (two) times daily with a meal. 180 tablet 3  . pantoprazole (PROTONIX) 40 MG tablet TAKE 1 TABLET EVERY DAY 45 MINUTES PRIOR TO BREAKFAST 90 tablet 0  . ondansetron (ZOFRAN) 8 MG tablet Take 1 tablet (8 mg total) by mouth every 8 (eight) hours as needed for nausea or vomiting. (Patient not taking: Reported on 10/22/2017) 20 tablet 1  . silver sulfADIAZINE (SILVADENE) 1 % cream APPLY TO AFFECTED AREA EVERY DAY (Patient not taking: Reported on 10/22/2017) 50 g 0   No current facility-administered medications on file prior to visit.  ROS as in subjective   Objective: BP (!) 150/70   Pulse 71   Resp 16   Wt 227 lb 6.4 oz (103.1 kg)   SpO2 96%   BMI 33.58 kg/m   Wt Readings from Last 3 Encounters:  04/21/18 227 lb 6.4 oz (103.1 kg)  10/22/17 225 lb 12.8 oz (102.4 kg)  06/28/17 213 lb 3.2 oz (96.7 kg)   BP Readings from Last 3 Encounters:  04/21/18 (!) 150/70  10/22/17 138/80  06/28/17 130/70   General appearance: alert, no distress, WD/WN,  Neck: supple, no lymphadenopathy, no thyromegaly, no masses Heart: RRR, normal S1, S2, no murmurs Lungs: CTA bilaterally, no wheezes, rhonchi, or rales Pulses: 2+ symmetric, upper and lower extremities, normal cap refill     Assessment: Encounter Diagnoses  Name Primary?   . Essential hypertension, benign Yes  . Cerebrovascular accident (CVA) due to thrombosis of precerebral artery ([place])   . Uncontrolled type 2 diabetes mellitus with hyperglycemia ([place])   . Hyperlipidemia, unspecified hyperlipidemia type   . Noncompliance       Plan: Diabetes - uncontrolled, noncompliant with some of treatment and medications.  Gave sample of Basaglar (only basal sample I had today) to exchange out for Lantus since he can't afford Lantus currently.   C/t metformin and meal time insulin.   Referral to endocrinology to help with getting sugars under control.  He could benefit from nutritions and endocrinology services.  hyperlipidemia - c/t current medication, reviewed labs from fall 2019.   Hx/o CVA - needs to get diabetes under control  He is not smoking cigarettes, but is smoking marijuana.   He didn't seem interest in discussing this.   HTN - has been relatively controlled without medication prior to today.    Consider ACEi but he declines for now  Of note, discussed his behavior with office manager.   If he has similar behavior the next visit, we may dismiss him.  John Roman was seen today for med check.  Diagnoses and all orders for this visit:  Essential hypertension, benign  Cerebrovascular accident (CVA) due to thrombosis of precerebral artery ([place])  Uncontrolled type 2 diabetes mellitus with hyperglycemia ([place]) -     Ambulatory referral to Endocrinology  Hyperlipidemia, unspecified hyperlipidemia type  Noncompliance  Other orders -     Insulin Glargine (BASAGLAR KWIKPEN) 100 UNIT/ML SOPN; Inject 0.32 mLs (32 Units total) into the skin daily. -     aspirin 81 MG EC tablet; Take 1 tablet (81 mg total) by mouth daily.

## 2018-05-21 ENCOUNTER — Encounter: Payer: Self-pay | Admitting: Internal Medicine

## 2018-06-01 ENCOUNTER — Other Ambulatory Visit: Payer: Self-pay | Admitting: Medical

## 2018-06-15 ENCOUNTER — Other Ambulatory Visit: Payer: Self-pay | Admitting: Medical

## 2018-06-16 NOTE — Telephone Encounter (Signed)
Is this ok to refill?  

## 2018-07-18 ENCOUNTER — Other Ambulatory Visit: Payer: Self-pay | Admitting: Medical

## 2018-07-18 NOTE — Telephone Encounter (Signed)
Is this refill appropriate?  

## 2018-07-21 ENCOUNTER — Other Ambulatory Visit: Payer: Self-pay

## 2018-07-21 ENCOUNTER — Encounter: Payer: Self-pay | Admitting: Medical

## 2018-07-21 ENCOUNTER — Ambulatory Visit (INDEPENDENT_AMBULATORY_CARE_PROVIDER_SITE_OTHER): Payer: HMO | Admitting: Medical

## 2018-07-21 VITALS — BP 150/82 | HR 65 | Temp 98.3°F | Wt 231.4 lb

## 2018-07-21 DIAGNOSIS — E785 Hyperlipidemia, unspecified: Secondary | ICD-10-CM | POA: Diagnosis not present

## 2018-07-21 DIAGNOSIS — I6300 Cerebral infarction due to thrombosis of unspecified precerebral artery: Secondary | ICD-10-CM

## 2018-07-21 DIAGNOSIS — I10 Essential (primary) hypertension: Secondary | ICD-10-CM

## 2018-07-21 DIAGNOSIS — E1165 Type 2 diabetes mellitus with hyperglycemia: Secondary | ICD-10-CM | POA: Diagnosis not present

## 2018-07-21 DIAGNOSIS — E669 Obesity, unspecified: Secondary | ICD-10-CM

## 2018-07-21 NOTE — Progress Notes (Signed)
Subjective: Chief Complaint  Patient presents with  . Diabetes   Here for med check.  Diabetes-  He notes that he is taking NovoLog 14 units 2 times a day, mainly morning and dinner.  Doesn't usually eat lunch.  Taking metformin BID.  Taking Basaglar 32 u.  Checks his blood sugars once to twice daily but he cannot give me a lot of numbers.  No foot concerns.    Hse denies history of high blood pressure.    He denies any significant diet discretion.  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  He notes that he has been spending a lot of time in his hot tub  Compliant with plavix, Lipitor, aspirin  Past Medical History:  Diagnosis Date  . ANXIETY 07/04/2007  . ASTHMATIC BRONCHITIS, ACUTE 12/19/2007  . DIABETES MELLITUS, TYPE II 09/28/2006  . ERECTILE DYSFUNCTION 09/28/2006  . GERD 09/28/2006  . Hematochezia 05/29/2010  . HYPERLIPIDEMIA 09/28/2006  . HYPERTENSION 09/28/2006  . Illiterate 09/11/2012  . LOW BACK PAIN 09/28/2006  . OBESITY 09/28/2006  . PLANTAR FASCIITIS, BILATERAL 07/04/2007  . Stroke [facility])    Current Outpatient Medications on File Prior to Visit  Medication Sig Dispense Refill  . aspirin 81 MG EC tablet Take 1 tablet (81 mg total) by mouth daily. 90 tablet 3  . atorvastatin (LIPITOR) 80 MG tablet TAKE 1 TABLET (80 MG TOTAL) BY MOUTH DAILY AT 6 PM. 90 tablet 1  . blood glucose meter kit and supplies KIT Check blood sugar up to four times daily. Once in the morning and three times daily with meals. (FOR ICD-9 250.00, 250.01). 1 each 0  . clopidogrel (PLAVIX) 75 MG tablet TAKE 1 TABLET BY MOUTH EVERY DAY 30 tablet 2  . glucose blood test strip Test 4 times a day, one in the morning and 3 times daily with meals 200 each 12  . insulin aspart (NOVOLOG FLEXPEN) 100 UNIT/ML FlexPen Inject 7 Units into the skin 3 (three) times daily with meals. (Patient taking differently: Inject 15 Units into the skin 3  (three) times daily with meals. ) 15 mL 5  . Insulin Glargine (BASAGLAR KWIKPEN) 100 UNIT/ML SOPN Inject 0.32 mLs (32 Units total) into the skin daily. 3 mL 0  . Insulin Pen Needle (PEN NEEDLES) 32G X 4 MM MISC 180 each by Does not apply route 2 (two) times daily. 200 each 11  . metFORMIN (GLUCOPHAGE) 500 MG tablet TAKE 1 TABLET (500 MG TOTAL) BY MOUTH 2 (TWO) TIMES DAILY WITH A MEAL. 180 tablet 1  . pantoprazole (PROTONIX) 40 MG tablet TAKE 1 TABLET EVERY DAY 45 MINUTES PRIOR TO BREAKFAST 90 tablet 0  . ondansetron (ZOFRAN) 8 MG tablet Take 1 tablet (8 mg total) by mouth every 8 (eight) hours as needed for nausea or vomiting. (Patient not taking: Reported on 10/22/2017) 20 tablet 1  . silver sulfADIAZINE (SILVADENE) 1 % cream APPLY TO AFFECTED AREA EVERY DAY (Patient not taking: Reported on 10/22/2017) 50 g 0   No current facility-administered medications on file prior to visit.    ROS as in subjective   Objective: BP (!) 150/82   Pulse 65   Temp 98.3 F (36.8 C) (Oral)   Wt 231 lb 6.4 oz (105 kg)   SpO2 97%   BMI 34.17 kg/m   Wt Readings from Last 3 Encounters:  07/21/18 231 lb 6.4 oz (105 kg)  04/21/18  227 lb 6.4 oz (103.1 kg)  10/22/17 225 lb 12.8 oz (102.4 kg)   BP Readings from Last 3 Encounters:  07/21/18 (!) 150/82  04/21/18 (!) 150/70  10/22/17 138/80   General appearance: alert, no distress, WD/WN,  Neck: supple, no lymphadenopathy, no thyromegaly, no masses Heart: RRR, normal S1, S2, no murmurs Lungs: CTA bilaterally, no wheezes, rhonchi, or rales Pulses: 2+ symmetric, upper and lower extremities, normal cap refill  Foot exam declined, had on long laced up work boots.   Assessment: Encounter Diagnoses  Name Primary?  Marland Kitchen Uncontrolled type 2 diabetes mellitus with hyperglycemia ([place]) Yes  . Hyperlipidemia, unspecified hyperlipidemia type   . Essential hypertension, benign   . Cerebrovascular accident (CVA) due to thrombosis of precerebral artery ([place])   .  Obesity with serious comorbidity, unspecified classification, unspecified obesity type       Plan: Diabetes - uncontrolled, labs today.   Counseled on diet, exercise, need for better control.  hyperlipidemia - c/t current medication, labs today  Hx/o CVA - needs to get diabetes and overall health issues under better control  He is not smoking cigarettes, but is smoking marijuana.    HTN - has been relatively controlled without medication prior to today.    Plan to add ACEi pending labs.   Ranson was seen today for diabetes.  Diagnoses and all orders for this visit:  Uncontrolled type 2 diabetes mellitus with hyperglycemia ([place]) -     Hemoglobin A1c -     Microalbumin/Creatinine Ratio, Urine  Hyperlipidemia, unspecified hyperlipidemia type -     Comprehensive metabolic panel -     Lipid panel  Essential hypertension, benign -     Comprehensive metabolic panel -     Lipid panel -     CBC  Cerebrovascular accident (CVA) due to thrombosis of precerebral artery (HCC) -     CBC  Obesity with serious comorbidity, unspecified classification, unspecified obesity type -     Comprehensive metabolic panel -     Hemoglobin A1c -     CBC

## 2018-07-22 LAB — COMPREHENSIVE METABOLIC PANEL
ALT: 26 IU/L (ref 0–44)
AST: 25 IU/L (ref 0–40)
Albumin/Globulin Ratio: 1.8 (ref 1.2–2.2)
Albumin: 4.4 g/dL (ref 3.8–4.8)
Alkaline Phosphatase: 70 IU/L (ref 39–117)
BUN/Creatinine Ratio: 17 (ref 10–24)
BUN: 13 mg/dL (ref 8–27)
Bilirubin Total: 0.3 mg/dL (ref 0.0–1.2)
CO2: 19 mmol/L — ABNORMAL LOW (ref 20–29)
Calcium: 9.2 mg/dL (ref 8.6–10.2)
Chloride: 102 mmol/L (ref 96–106)
Creatinine, Ser: 0.77 mg/dL (ref 0.76–1.27)
GFR calc Af Amer: 110 mL/min/{1.73_m2} (ref >59)
GFR calc non Af Amer: 95 mL/min/{1.73_m2} (ref >59)
Globulin, Total: 2.4 g/dL (ref 1.5–4.5)
Glucose: 283 mg/dL — ABNORMAL HIGH (ref 65–99)
Potassium: 4.2 mmol/L (ref 3.5–5.2)
Sodium: 135 mmol/L (ref 134–144)
Total Protein: 6.8 g/dL (ref 6.0–8.5)

## 2018-07-22 LAB — CBC
Hematocrit: 42.4 % (ref 37.5–51.0)
Hemoglobin: 14.1 g/dL (ref 13.0–17.7)
MCH: 26.6 pg (ref 26.6–33.0)
MCHC: 33.3 g/dL (ref 31.5–35.7)
MCV: 80 fL (ref 79–97)
Platelets: 217 10*3/uL (ref 150–450)
RBC: 5.31 x10E6/uL (ref 4.14–5.80)
RDW: 13.6 % (ref 11.6–15.4)
WBC: 5.9 10*3/uL (ref 3.4–10.8)

## 2018-07-22 LAB — LIPID PANEL
Chol/HDL Ratio: 4.2 ratio (ref 0.0–5.0)
Cholesterol, Total: 144 mg/dL (ref 100–199)
HDL: 34 mg/dL — ABNORMAL LOW (ref >39)
LDL Calculated: 88 mg/dL (ref 0–99)
Triglycerides: 110 mg/dL (ref 0–149)
VLDL Cholesterol Cal: 22 mg/dL (ref 5–40)

## 2018-07-22 LAB — MICROALBUMIN / CREATININE URINE RATIO
Creatinine, Urine: 70.9 mg/dL
Microalb/Creat Ratio: 23 mg/g creat (ref 0–29)
Microalbumin, Urine: 16.2 ug/mL

## 2018-07-22 LAB — HEMOGLOBIN A1C
Est. average glucose Bld gHb Est-mCnc: 278 mg/dL
Hgb A1c MFr Bld: 11.3 % — ABNORMAL HIGH (ref 4.8–5.6)

## 2018-07-23 ENCOUNTER — Other Ambulatory Visit: Payer: Self-pay | Admitting: Medical

## 2018-07-23 ENCOUNTER — Encounter: Payer: Self-pay | Admitting: Medical

## 2018-07-23 ENCOUNTER — Other Ambulatory Visit: Payer: Self-pay

## 2018-07-23 DIAGNOSIS — E1165 Type 2 diabetes mellitus with hyperglycemia: Secondary | ICD-10-CM

## 2018-07-23 MED ORDER — BASAGLAR KWIKPEN 100 UNIT/ML ~~LOC~~ SOPN: 40.0000 [IU] | PEN_INJECTOR | Freq: Every day | SUBCUTANEOUS | 3 refills | Status: DC

## 2018-07-23 MED ORDER — ATORVASTATIN CALCIUM 80 MG PO TABS: 80.0000 mg | ORAL_TABLET | Freq: Every day | ORAL | 1 refills | Status: DC

## 2018-07-23 MED ORDER — PEN NEEDLES 32G X 4 MM MISC: 180.0000 | Freq: Two times a day (BID) | 11 refills | Status: AC

## 2018-07-23 MED ORDER — NOVOLOG FLEXPEN 100 UNIT/ML ~~LOC~~ SOPN: 17.0000 [IU] | PEN_INJECTOR | Freq: Two times a day (BID) | SUBCUTANEOUS | 3 refills | Status: DC

## 2018-07-23 MED ORDER — CLOPIDOGREL BISULFATE 75 MG PO TABS: 75.0000 mg | ORAL_TABLET | Freq: Every day | ORAL | 3 refills | Status: DC

## 2018-07-23 MED ORDER — QUINAPRIL HCL 10 MG PO TABS: 10.0000 mg | ORAL_TABLET | Freq: Every day | ORAL | 3 refills | Status: DC

## 2018-07-23 NOTE — Patient Outreach (Signed)
  [facility] [facility]) Care Management Chronic Special Needs Program    07/23/2018  Name: John Roman, DOB: [birth year]  MRN: [id number]   [name] is enrolled in a chronic special needs plan. RNCM called to review health risk assessment and complete individualized care plan. No answer. HIPPA compliant message left.   Plan: await return call. Outreach telephonically in 3-5 business days if no return call.  Thea Silversmith, RN, MSN, [place] [phone]

## 2018-07-25 ENCOUNTER — Other Ambulatory Visit: Payer: Self-pay

## 2018-07-25 NOTE — Patient Outreach (Addendum)
[place] [facility]) Care Management Chronic Special Needs Program  07/25/2018  Name: John Roman DOB: [birth year]  MRN: [id number]  [name] is enrolled in a chronic special needs plan for Diabetes. Chronic Care Management Coordinator telephoned client to review health risk assessment and to develop individualized care plan.  RNCM Introduced the chronic care management program.   RNCM spoke with client, two patient identifiers confirmed.  Subjective: Client initially was agreeable to speak with RNCM. Client reports history of diabetes, stroke, atrial fibrillation. Per Health risk assessment, heart attack was marked, but Client denies a history of a heart attack. As RNCM continued to discuss his health, he became irritated and hung up when [facility] was explaining what cultural or religious considerations has to do with his health. RNCM called back and asked client if he wanted RNCM to speak with his son regarding the assessment questions and his health, client declined adding "he is worse than me". RNCM reinforced the purpose for the call and emphasized that it is a benefit to him at no cost. When asking about activities of daily living, client hung up after stating "I am fine ma'am and don't call me back".  Goals Addressed            This Visit's Progress   . Client understands the importance of follow-up with providers by attending scheduled visits      . Client will not report change from baseline and no repeated symptoms of stroke with in the next 6 months      . Client will report abillity to obtain Medications.       A Pharmacist from [employer] will call you to see if you have any needs related to your medications.    . Client will report no worsening of symptoms of Atrial Fibrillation within the next 6 months      . Client will use Assistive Devices as needed and verbalize understanding of device use      . Client will verbalize knowledge of diabetes  self-management as evidenced by Hgb A1C <7 or as defined by provider.       Diabetes self management actions:  Glucose monitoring per provider recommendations  Perform Quality checks on blood meter  Eat Healthy  Check feet daily  Visit provider every 3-6 months as directed  Hbg A1C level every 3-6 months.  Eye Exam yearly    . Client will verbalize knowledge of self management of Hypertension as evidences by BP reading of 140/90 or less; or as defined by provider      . HEMOGLOBIN A1C < 7.0       Diabetes self-management actions: Glucose monitoring per provider recommendations Eat Healthy Check feet daily Visit provider every 3-6 months as directed Hbg A1C level every 3-6 months. Eye Exam yearly    . Maintain timely refills of diabetic medication as prescribed within the year .      Marland Kitchen Obtain annual  Lipid Profile, LDL-C      . Obtain Annual Eye (retinal)  Exam       . Obtain Annual Foot Exam      . Obtain annual screen for micro albuminuria (urine) , nephropathy (kidney problems)      . Obtain Hemoglobin A1C at least 2 times per year      . Visit Primary Care Provider or Endocrinologist at least 2 times per year         Prior to client hanging up the second timeRNCM  reinforced with client that he would be receiving educational information and goals based on today's assessment and his health risk assessment.   Plan:  Send successful outreach letter with a copy of their individualized care plan, Send individual care plan to provider and Send educational material Chronic care management coordination will continue to follow as C-SNP chronic care management coordinator.  Will refer client to: [facility] Pharmacy sometimes goes without medications due to cost.   Kathyrn SheriffJuana Antonette Hendricks, RN, MSN, [facility] Center[facility],CCM Chronic Care Management Coordinator [facility] [phone]

## 2018-07-28 ENCOUNTER — Telehealth: Payer: Self-pay | Admitting: Medical

## 2018-07-28 NOTE — Telephone Encounter (Signed)
I have put in referral to epic and proficient on 07-23-2018.  Just waiting for them to pick up referral and schedule patient appointment.

## 2018-07-28 NOTE — Telephone Encounter (Signed)
I received notice insurance will note cover Basaglar, so lets try to get into endocrinology before 30 days is up so they can decide what to change him to before Basaglar month supply runs out

## 2018-07-29 ENCOUNTER — Telehealth: Payer: Self-pay | Admitting: Pharmacist

## 2018-07-29 NOTE — Patient Outreach (Signed)
[facility]) Care Management  [place]   07/29/2018  John Roman 25-Jun-1955 [id number]  Reason for referral: Medication Assistance, Medication Review  Referral source: [employer] C-SNP Care Manager with [facility] Current insurance: [employer] C-SNP  PMHx includes but not limited to:   Anxiety, BPH, CVA, hypertension, GERD, Hyperlipidemia, type 2 diabetes, obesity, and low back pain.  Outreach:  Successful telephone call with patient.  HIPAA identifiers verified.   Subjective:  Patient was called due to a referral from [place] nurse requesting medication assistance evaluation due to the way the patient answered his HRA. Patient did not report any issues with medication affordability during our conversation. He said Basaglar was sent to his pharmacy but he did not need it. He said the copay was $135.  Objective: The ASCVD Risk score John Bussing DC Jr., et al., 2013) failed to calculate for the following reasons:   The patient has a prior MI or stroke diagnosis  Lab Results  Component Value Date   CREATININE 0.77 07/21/2018   CREATININE 0.76 10/22/2017   CREATININE 0.70 (L) 06/28/2017    Lab Results  Component Value Date   HGBA1C 11.3 (H) 07/21/2018    Lipid Panel     Component Value Date/Time   CHOL 144 07/21/2018 0943   TRIG 110 07/21/2018 0943   HDL 34 (L) 07/21/2018 0943   CHOLHDL 4.2 07/21/2018 0943   CHOLHDL 6.9 04/06/2017 0734   VLDL 23 04/06/2017 0734   LDLCALC 88 07/21/2018 0943   LDLDIRECT 101.2 05/31/2011 1612    BP Readings from Last 3 Encounters:  07/21/18 (!) 150/82  04/21/18 (!) 150/70  10/22/17 138/80    No Known Allergies  Medications Reviewed Today    Reviewed by John Roman, [place] (Pharmacist) on 07/29/18 at 1713  Med List Status: <None>  Medication Order Taking? Sig Documenting Provider Last Dose Status Informant  aspirin 81 MG EC tablet [id number] Yes Take 1 tablet (81 mg total) by mouth daily.  [name], Camelia Eng, PA-C Taking Active   atorvastatin (LIPITOR) 80 MG tablet [id number] Yes Take 1 tablet (80 mg total) by mouth daily at 6 PM. [name], Camelia Eng, PA-C Taking Active   blood glucose meter kit and supplies KIT [id number] Yes Check blood sugar up to four times daily. Once in the morning and three times daily with meals. (FOR ICD-9 250.00, 250.01). Melanee Spry, MD Taking Active   clopidogrel (PLAVIX) 75 MG tablet [id number] Yes Take 1 tablet (75 mg total) by mouth daily. [name], Camelia Eng, PA-C Taking Active   glucose blood test strip [id number] Yes Test 4 times a day, one in the morning and 3 times daily with meals [name], Camelia Eng, PA-C Taking Active   insulin aspart (NOVOLOG FLEXPEN) 100 UNIT/ML FlexPen [id number] Yes Inject 17 Units into the skin 2 (two) times daily before a meal. [name], Camelia Eng, PA-C Taking Active   Insulin Glargine (BASAGLAR KWIKPEN) 100 UNIT/ML SOPN [id number] Yes Inject 0.4 mLs (40 Units total) into the skin at bedtime. [name], Camelia Eng, PA-C Taking Active   Insulin Pen Needle (PEN NEEDLES) 32G X 4 MM MISC [id number] Yes 180 each by Does not apply route 2 (two) times daily. [name], Camelia Eng, PA-C Taking Active   metFORMIN (GLUCOPHAGE) 500 MG tablet [id number] Yes TAKE 1 TABLET (500 MG TOTAL) BY MOUTH 2 (TWO) TIMES DAILY WITH A MEAL. Carlena Hurl, PA-C Taking Active   pantoprazole (PROTONIX) 40 MG tablet [id number] Yes TAKE  1 TABLET EVERY DAY 45 MINUTES PRIOR TO BREAKFAST [name], Camelia Eng, PA-C Taking Active   quinapril (ACCUPRIL) 10 MG tablet [id number] Yes Take 1 tablet (10 mg total) by mouth daily. [name], Camelia Eng, PA-C Taking Active           Assessment: Drugs sorted by system:  Cardiovascular:  Aspirin, Atorvastatin, Clopidogrel, Quinapril  Pulmonary/Allergy:  Gastrointestinal: Pantoprazole,   Endocrine: Basaglar, Novolog, Metformin  Medication Review Findings:  . Adherence-patient had to be reminded of the dose increase from his  07/23/2018 PCP visit.  o From 07/23/2018 visit with Chana Bode:  increase the long-acting insulin Basaglar from 32 units nightly up to 40 units nightly  2- increase the NovoLog mealtime insulin from 14 units twice daily with meals up to 17 units twice daily with meals      Medication Assistance Findings:  Medication assistance needs identified: unknown. Will reach out to HTA about the billing of [place]. It is non-formulary but the patient said it was paid for earlier this year and the price increased to $135 most recently.   PPlan: . Will contact [employer] regarding [place] billing.  . Will follow-up in 1-2 business days.   John Roman, PharmD, [place] Clinical Pharmacist [phone]

## 2018-07-30 ENCOUNTER — Other Ambulatory Visit: Payer: Self-pay | Admitting: Pharmacist

## 2018-07-30 NOTE — Patient Outreach (Addendum)
[place] [facility]) Care Management  07/30/2018  Riot Waterworth 07-Jun-1955 [id number]  Patient was called to follow up on medication assistance. HIPAA identifiers were obtained.  When I spoke with the patient yesterday, he said Nancee Liter was covered when he first got it, then the price went up.  As suspected, Nancee Liter is non-formulary on the patient's insurance. As such, [employer] said the price would be 180 for a 37 day supply.  HTA reported the following information:   OOP= $0  TDS=  $2206.14  No LIS  Patient's provider would need to switch him to a basal insulin product (Levemir, Tresiba, Lantus).  Patient was educated about the donut hole. Once patient's reach a TDS (Total drug spend) of $4020, they will be responsible for 25% of the Total Drug Cost (retail price).  To keep the patient out of the donut hole, patient assistance programs could be accessed for the patient.   Mirian Mo and Levemir are available through [employer].  Plan: Route note to patient's provider requesting a therapeutic substitution and to request signature of patient assistance forms.   Elayne Guerin, PharmD, [place] Clinical Pharmacist [phone]

## 2018-08-04 ENCOUNTER — Ambulatory Visit: Payer: Self-pay | Admitting: Pharmacist

## 2018-08-04 ENCOUNTER — Telehealth: Payer: Self-pay | Admitting: Pharmacist

## 2018-08-04 NOTE — Patient Outreach (Signed)
[facility]) Care Management  08/04/2018  Kaevion Sinclair 1955/09/14 [id number]   Called and spoke with the patient and his son. HIPAA identifiers were obtained.  Patient's provider approved of the switch to [place].  Patient will be sent a [place] application via mail.  Patient's son communicated understanding and said they would complete the application ASAP and mail it back to [facility].   Plan: Route [employer], CPhT a letter to follow up with the patient.   Follow up on patient assistance process in 6-8 weeks.   Elayne Guerin, PharmD, [place] Clinical Pharmacist [phone]

## 2018-08-05 ENCOUNTER — Other Ambulatory Visit: Payer: Self-pay | Admitting: Pharmacy Technician

## 2018-08-05 NOTE — Patient Outreach (Signed)
[place] [facility]) Care Management  08/05/2018  Gaynelle Arabian 1955-11-07 [id number]    .                       Medication Assistance Referral  Referral From: [place]  Medication/Company: Tyler Aas and Novolog / [employer] Patient application portion:  Education officer, museum portion: Faxed  to Dr. [name] [name]    Follow up:  Will follow up with patient in 5-10 business days to confirm application(s) have been received.  Jacquline Terrill P. Aubreyanna Dorrough, [place] Management [phone]

## 2018-08-12 ENCOUNTER — Telehealth: Payer: Self-pay | Admitting: Medical

## 2018-08-12 NOTE — Telephone Encounter (Signed)
PT ASSISTANCE TRESIBA & NOVOLOG completed & faxed into [place] care

## 2018-08-18 ENCOUNTER — Other Ambulatory Visit: Payer: Self-pay | Admitting: Pharmacy Technician

## 2018-08-18 ENCOUNTER — Other Ambulatory Visit: Payer: Self-pay | Admitting: Medical

## 2018-08-18 ENCOUNTER — Telehealth: Payer: Self-pay | Admitting: Medical

## 2018-08-18 ENCOUNTER — Other Ambulatory Visit: Payer: Self-pay

## 2018-08-18 NOTE — Patient Outreach (Signed)
[name]) Care Management  08/18/2018  John Roman Apr 07, 1955 [id number]    Successful outreach call placed to patient in regards to [employer] application for [employer].  Spoke to patient, HIPAA identifiers verified.  Patient informed he is not sure if he has received the applications. He informed he has not checked the mail.   Informed patient to call me either way (if he did or did not received them) and patient was agreeable.He also informed if he has received them then he will fill them out and turn them back in. Informed patient the applications were mailed in an envelope that has [employer] on the outside with a letter detailing what was needed to submit back with the application. Patient verbalized understanding.  Will followup with patient in 10-15 business days if phone call is not returned.  [name] P. [name], [name] [phone]

## 2018-08-18 NOTE — Telephone Encounter (Signed)
John Roman, check on referral status to endocrinology as he needs to see them next

## 2018-08-18 NOTE — Telephone Encounter (Signed)
  Pt left message with answering service that he needs rx sent in for insulin  I called pt back to find out which insulin he needs, apparently he takes 2 different ones and we were supposed to have sent in rx already I asked pt to tell me name on his that he has now Pt was rude and argumentative and cursing, could not get answers Pt continued to curse and I ended phone call  Pt has called back and left voicemail for someone to call him about his insulin

## 2018-08-19 NOTE — Telephone Encounter (Signed)
Pt son stated he will call back to get the phone number for endocrinologist

## 2018-08-19 NOTE — Telephone Encounter (Signed)
Endo has left at least 2 messages asking patient to call back and schedule appointment.

## 2018-08-19 NOTE — Telephone Encounter (Signed)
Genera - before you call him, get the phone number for endocrinology ready for him.   Please call him and remind him that as of his last visit his diabetes is way out of control.  Thus I want him to see a diabetes specialist.  We referred him to the diabetes specialist.  They apparently have tried to call him multiple times and left messages.  I had given him a sample of insulin to last him enough time to get in with a diabetes specialist.  Unfortunately now it has been a month.  Please give him the phone number so he can call them now.   (If he seems to get irate or irritable, let me know.  Based on prior phone calls and in the office conversations where he has used profanity and been hard to deal with, we may have to dismiss him if he continues with those types of behaviors)

## 2018-08-26 ENCOUNTER — Other Ambulatory Visit: Payer: Self-pay | Admitting: Medical

## 2018-08-26 NOTE — Telephone Encounter (Signed)
Is this ok to refill?  

## 2018-08-28 ENCOUNTER — Other Ambulatory Visit: Payer: Self-pay | Admitting: Pharmacy Technician

## 2018-08-28 NOTE — Patient Outreach (Signed)
[place] [facility]) Care Management  08/28/2018  John Roman 02-Dec-1955 [id number]    Received message from John Roman, Administrative Assistant with [facility] [place] that patient's daughter had called with questions concerning her father's patient assistance application with [employer] for [place].  Successful outgoing call placed to patient's daughter John Roman in regards to the above message.  HIPAA identifiers verified.  John Roman informed she had placed the financial information in the mail to come back to [facility]. Inquired if John Roman also included the [employer]. John Roman informed she had not and inquired about more information. Discussed the patient assistance process with John Roman. John Roman verbalized understanding and inquired if we could email her a blank application. Informed John Roman I would email a blank application and discussed with John Roman what types of information was needed on the application.  Will followup with patient at scheduled time if application has not been received.  Marni Franzoni P. Xylia Scherger, [place] Management [phone]

## 2018-09-02 ENCOUNTER — Other Ambulatory Visit: Payer: Self-pay | Admitting: Pharmacy Technician

## 2018-09-02 NOTE — Patient Outreach (Signed)
[place] [facility]) Care Management  09/02/2018  Omaree Fuqua 1955-12-21 [id number]    Received all necessary documents and signatures from both patient and provider for [employer] application for [place].  Submitted completed application to [employer] via fax.  Will followup with [employer] in 3-5 business days.  Lakya Schrupp P. Tarryn Bogdan, [place] Management [phone]

## 2018-09-05 ENCOUNTER — Other Ambulatory Visit: Payer: Self-pay | Admitting: Pharmacy Technician

## 2018-09-05 NOTE — Patient Outreach (Signed)
[facility]) Care Management  09/05/2018  Jovon Winterhalter [birth year] [id number]   Care coordination call placed to [place] in regards to patient's application for [place] and Novolog.  Spoke to Ruthie who informed patient had been APPROVED 09/04/2018-02/05/2019. She informed that his patient ID is [id number]. She informed he would receive 4 boxes of [place] and 2 boxes of Novolog. Inquired if shipment could be expedited and Ruthie informed she would put in the request but it was not a guarantee. She informed the medication would arrive between 5-15 business days to the provider's office by UPS. She informed a refill could be requested mid October.  Will followup with patient in 7-14 business days to inquire if medication was received.  Jhayla Podgorski P. Manon [place], [place] Management [phone]

## 2018-09-09 ENCOUNTER — Encounter: Payer: Self-pay | Admitting: Internal Medicine

## 2018-09-13 ENCOUNTER — Telehealth: Payer: Self-pay | Admitting: Medical

## 2018-09-13 NOTE — Telephone Encounter (Signed)
Recv'd fax that PT ASSISTANCE NOVO [place] approved 4 months supply will be shipped to our office within 14 days

## 2018-09-16 NOTE — Telephone Encounter (Signed)
Pt Assistance Tresiba & Novolog & needles received in the office, called pt and informed, he will pick up

## 2018-09-17 ENCOUNTER — Other Ambulatory Visit: Payer: Self-pay | Admitting: Pharmacy Technician

## 2018-09-17 NOTE — Patient Outreach (Signed)
[facility]) Care Management  09/17/2018  John Roman 1955/12/03 [id number]    Unsuccessful outreach call placed to patient in regards to [employer] application for [employer].  Unfortunately patient did not answer the phone, HIPAA compliant voicemail left.  Was calling patient to inquire if he has picked up the medication from the provider office. According to the following note, the medication is at the provider's office.  September 16, 2018 John Roman    11:53 AM Note   Pt [place] needles received in the office, called pt and informed, he will pick up       Will followup with patient in 3-7 business days if call is not returned.  John Roman, [place] Management [phone]

## 2018-09-22 ENCOUNTER — Other Ambulatory Visit: Payer: Self-pay | Admitting: Pharmacy Technician

## 2018-09-22 ENCOUNTER — Encounter: Payer: Self-pay | Admitting: Pharmacist

## 2018-09-22 NOTE — Patient Outreach (Signed)
[place] [facility]) Care Management  09/22/2018  John Roman [birth year] [id number]   Successful outgoing call placed to patient in regards to [employer] application for [employer].  Spoke to patient's son [name], HIPAA identifiers verified. Patient's son had me on speaker phone as his father was driving.  Son informed that his father had received his patient assistance medication. They confirmed receiving 4 boxes of [place] and 2 boxes of  Novolog. Discussed refill procedure with them and the son verbalized that his father's provider's office had requested they contact them when his father is down to a 2-3 week supply and then they would contact [employer]. Confirmed that was the correct procedure.  Inquired if they had any other questions or concerns as it relates to these medications or patient assistance. They informed they did not have any other questions or concerns. Confirmed they had name and number for future reference or needs.  Will route note to [place] that patient assistance has been completed and will remove myself from care team.  Luiz Ochoa. Lakoda Mcanany, [place] Management [phone]

## 2018-09-22 NOTE — Telephone Encounter (Signed)
-----   Message from Jason Fila, CPhT sent at 09/22/2018 12:28 PM EDT ----- Patient assistance completed. Removed myself from care team.  Luiz Ochoa. [place], [place] Management [phone]

## 2018-10-02 ENCOUNTER — Other Ambulatory Visit: Payer: Self-pay | Admitting: Pharmacist

## 2018-10-02 NOTE — Patient Outreach (Signed)
[place] [facility]) Care Management  10/02/2018  John Roman Aug 31, 1955 [id number]   Patient was called for CSNP follow up. HIPAA identifiers were obtained. Just after advising the purpose of my call and verifying HIPAA, patient told me he was at work and did not have time to talk to me.  He did confirm receipt of the medications we apply to get him through patient assistance programs:  [place] and Novolog.  When asked if he understood how to reorder, he said he would just call his provider. An attempt was made to further explain but the patient said he would just call his provider.  The call was ended to respect the patient being at work.  Plan: Call patient back in 3 months for CSNP follow up.   Elayne Guerin, PharmD, [place] Clinical Pharmacist [phone]

## 2018-11-21 ENCOUNTER — Other Ambulatory Visit: Payer: Self-pay | Admitting: Medical

## 2018-12-09 ENCOUNTER — Telehealth: Payer: Self-pay | Admitting: Medical

## 2018-12-09 NOTE — Telephone Encounter (Signed)
Recv'd PT ASSISTANCE reorder for Novolog, Tyler Aas & needles for [employer]

## 2018-12-25 ENCOUNTER — Other Ambulatory Visit: Payer: Self-pay | Admitting: Pharmacist

## 2018-12-25 NOTE — Patient Outreach (Addendum)
[facility]) Care Management  12/25/2018  John Roman [birth year] [id number]   Patient was called for CSNP follow up. HIPAA identifiers were obtained.   Patient is a 63 year old male with multiple medical conditions including but not limited to: anxiety,BPH, CVA, cough, GERD, hyperlipemia, low back pain, type 2 diabetes and obesity.  Patient is receiving [place] and Novolog from [employer]. He reported having enough medication to last him through the end of the year.  He did not report any medication needs but did say he does not read well and mostly follows the instructions on the bottle.  He identified his insulins by the color of the pens.  Most recent HgA1c was 11.3% (June 2020). {Hopefully the patient's HgA1c will decrease at the next lab draw due to access to his insulin therapy at no cost.)  Although medication non-compliance due to cost could have been a factor, patient may benefit from increasing the basal insulin dose at bedtime and titrating the dose until his fasting reaches goal.  Patient also shared that he sometimes eats lunch but only uses insulin at breakfast and supper. (Newer lab results are needed for complete evaluation).  Plan: Call patient back in 4-6 weeks for follow up.  Elayne Guerin, PharmD, [place] Clinical Pharmacist [phone]

## 2018-12-25 NOTE — Telephone Encounter (Signed)
Pt Assistance medication received, called pt and informed

## 2018-12-29 NOTE — Telephone Encounter (Signed)
meds recv'd

## 2019-01-04 ENCOUNTER — Other Ambulatory Visit: Payer: Self-pay | Admitting: Medical

## 2019-01-05 ENCOUNTER — Telehealth: Payer: Self-pay | Admitting: Medical

## 2019-01-05 NOTE — Telephone Encounter (Signed)
Pt needs refills on test strips, he is completely out and has been out for 1 1/2 days And states request has been sent twice already today and I advised him that we have been closed for holiday and we are getting to rx requests as quickly as we can

## 2019-01-05 NOTE — Telephone Encounter (Signed)
Please check on status of endocrinology referral.  He should have already seen endocrinology by now.  I referred back in June.  I need him to be seen by endocrinology as I do not want to handle his uncontrolled diabetes.  I sent refill on his test strips today as he was in our parking lot and came to the door banging our locked door shouting about his test strips.

## 2019-01-05 NOTE — Telephone Encounter (Signed)
Is this okay to refill?  Endocrinology called pt multiple times and pt never scheduled with them., please advise

## 2019-01-06 NOTE — Telephone Encounter (Signed)
Endo has tried contacting patient several times. Unable to reach him.

## 2019-01-06 NOTE — Telephone Encounter (Signed)
Done yesterday.

## 2019-01-07 NOTE — Telephone Encounter (Signed)
Beverlee Nims, please write letter of dismissal.     He has not answered calls by endocrinology to establish care from our referrals delaying care and proper treatment.  I referred him to endocrinology months ago, and was wondering why he hasn't seen endo.  Apparently they have attempted to contact him but he wont answer their calls.   On more than one occasion now he has been rude using profanity to our staff.  He showed up yesterday at around 4:15pm first abruptly stopping Verdis Frederickson in the parking lot out front cursing at her about "when yall gonna fill my damn test strips", and then a few minutes later was banging on the locked door at [place] basically saying the same thing being rude and using profanity.    This is at least the second or third time he has acted with this behavior.   Its time he find another PCP.

## 2019-01-12 ENCOUNTER — Encounter: Payer: Self-pay | Admitting: Family Medicine

## 2019-01-12 NOTE — Telephone Encounter (Signed)
Dismissal being sent.

## 2019-01-14 ENCOUNTER — Other Ambulatory Visit: Payer: Self-pay

## 2019-01-14 NOTE — Patient Outreach (Signed)
  [facility]) Care Management Chronic Special Needs Program  01/14/2019  Name: John Roman DOB: [birth year]  MRN: [id number]  Mr. Garan Frappier is enrolled in a chronic special needs plan for Diabetes. Reviewed and updated care plan.  Subjective: RNCM called to follow up and review individualized care plan. Client answered the phone, confirmed two client identifiers. RNCM discussed the purpose of the call. Client reports his blood sugar today was 312, which he says is improved from the 400's. He states he checks his blood sugar about 3-4 times/day. He then ask about obtaining "a meter that you put in your side to keep you from sticking your finger so much". RNCM instructed that his doctor would have to write a prescription for the meter. RNCM also encouraged client to speak with the healthcare concierge regarding the benefit. Client hung up on RNCM. RNCM called client back and client acknowledged hang up stating "you could not help me". RNCM offered to transfer client to the healthcare concierge. Client stated, "why don't you just have them to call me", and client hung up again. Care plan updated based on available data.  Goals Addressed            This Visit's Progress   . COMPLETED: Client will report abillity to obtain Medications.       Reports has his medications.    . COMPLETED: Client will use Assistive Devices as needed and verbalize understanding of device use       Denies any difficulty with use of glucometer.    . COMPLETED: Maintain timely refills of diabetic medication as prescribed within the year .       Reports has medications.    . COMPLETED: Obtain annual  Lipid Profile, LDL-C       Done 07/21/2018       Plan: [facility] pharmacist updated. RNCM will send updated care plan to client, send updated care plan to primary care. RNCM will outreach per tier level within 6 months.   Thea Silversmith, RN, MSN, [place] [phone]    .

## 2019-01-15 ENCOUNTER — Other Ambulatory Visit: Payer: Self-pay

## 2019-01-15 ENCOUNTER — Telehealth: Payer: Self-pay | Admitting: Family Medicine

## 2019-01-15 MED ORDER — GLUCOSE BLOOD VI STRP: ORAL_STRIP | 3 refills | Status: DC

## 2019-01-15 MED ORDER — BLOOD GLUCOSE METER KIT: PACK | 0 refills | Status: DC

## 2019-01-15 MED ORDER — CONTOUR NEXT TEST VI STRP: ORAL_STRIP | 1 refills | Status: DC

## 2019-01-15 NOTE — Telephone Encounter (Signed)
Test strips has been sent to pharmacy for patient

## 2019-01-15 NOTE — Telephone Encounter (Signed)
HTA called to get Free Style Libra glucose machine, it is covered.  [facility].

## 2019-01-15 NOTE — Patient Outreach (Signed)
  [facility]) Care Management Chronic Special Needs Program    01/15/2019  Name: [name], DOB: 03-13-1955  MRN: [id number]   [name] is enrolled in a chronic special needs plan for Diabetes.  Care Coordination: RNCM received notification from primary care that client is dismissed from his practice and that client will need to find another primary care. RNCM notified [employer] health care concierge: request to assist client with finding another primary care; also informed of clients question regarding freestyle libre eligibility.   Updated care plan sent to both client and primary care on 01/14/2019.  Plan: RNCM will continue to follow. [facility] pharmacist updated.  Thea Silversmith, RN, MSN, [place] [phone]

## 2019-01-15 NOTE — Telephone Encounter (Signed)
Need Glucose monitor not strips

## 2019-01-15 NOTE — Telephone Encounter (Signed)
Pt called almost in tears, he states he didn't know he had done anything wrong.  He didn't realize "shit and damn" are cusswords.  He states they always talked like that in his house.  I explained they are cusswords and he cannot use those with our staff.  He states since his stroke he doesn't realize how he acts.  He is saying please call him and talk with him.

## 2019-01-15 NOTE — Telephone Encounter (Signed)
Sent the libra glucometer to pharmacy

## 2019-01-15 NOTE — Addendum Note (Signed)
Addended by: Edgar Frisk on: 01/15/2019 05:15 PM   Modules accepted: Orders

## 2019-01-16 ENCOUNTER — Telehealth: Payer: Self-pay | Admitting: Medical

## 2019-01-16 ENCOUNTER — Other Ambulatory Visit: Payer: Self-pay

## 2019-01-16 ENCOUNTER — Other Ambulatory Visit: Payer: Self-pay | Admitting: Medical

## 2019-01-16 NOTE — Telephone Encounter (Signed)
  Caryl Pina with [employer] called and she spoke to patient and patient said he said that he had requested to speak to you about his dismissal and he had not heard from anyone

## 2019-01-16 NOTE — Telephone Encounter (Signed)
Beverlee Nims gave the ok for this on yesterday. Spoke to her about it as well.

## 2019-01-16 NOTE — Telephone Encounter (Signed)
John Roman with [employer] called back, she had spoken to [facility] yesterday that pt needs rx sent to [place] machine and test strips It looks like something was put in and then cancelled,  Pharmacy does not have rx's   [facility]

## 2019-01-16 NOTE — Telephone Encounter (Signed)
Defer this to Beverlee Nims since we sent letter of dismissal

## 2019-01-16 NOTE — Telephone Encounter (Signed)
John Roman, I don't see the point in me speaking with him since you have already and we sent the letter.  I have nothing more to add.

## 2019-01-16 NOTE — Telephone Encounter (Signed)
Called pharmacy about the machine and test strips and they said a hardcopy would need to be faxed over because it's medicare. Please send hardcopy.

## 2019-01-16 NOTE — Telephone Encounter (Signed)
Today.  I am sending Beverlee Nims other correspondence as well

## 2019-01-16 NOTE — Telephone Encounter (Signed)
We discussed this yesterday.  Since he apparently has been dismissed in other locations as well, I think this further confirms that we made the right decision.  He had been reviewed on several visits to staff members using profanity and just being rude in general, he did not answer his phone or have follow-up as we discussed with endocrinology, so I feel that we cannot work together under the circumstances

## 2019-01-16 NOTE — Telephone Encounter (Signed)
Looks like it printed instead of sending electronically. Will call the pharmacy. Thanks

## 2019-01-19 ENCOUNTER — Ambulatory Visit: Payer: HMO | Admitting: Medical

## 2019-01-19 NOTE — Telephone Encounter (Signed)
I completely agree

## 2019-01-21 ENCOUNTER — Telehealth: Payer: Self-pay | Admitting: Medical

## 2019-01-21 NOTE — Telephone Encounter (Signed)
[facility]  He had brought the prescription we had written the other day for glucometer.  There was some confusion so they need clarification.  After speaking with the pharmacist here is what we will do:  I wrote 2 prescriptions, one for the freestyle libre system which we are not sure his insurance is going to cover.  But this is what he requested.  The other prescription is for [facility] lite glucometer and strips and lancets.  Please fax over both prescriptions and they can sorted out at the pharmacy.  I also with the pharmacy know that we have discharged the patient from our practice, so I gave them authorization to refill or make sure he has 30 days worth of supplies of medications but after that we are no longer his primary care of record

## 2019-01-21 NOTE — Telephone Encounter (Signed)
Hard copy has been faxed to the pharmacy

## 2019-01-22 ENCOUNTER — Other Ambulatory Visit: Payer: Self-pay | Admitting: Medical

## 2019-01-25 ENCOUNTER — Other Ambulatory Visit: Payer: Self-pay | Admitting: Medical

## 2019-02-08 ENCOUNTER — Other Ambulatory Visit: Payer: Self-pay | Admitting: Medical

## 2019-02-09 ENCOUNTER — Telehealth: Payer: Self-pay | Admitting: Family Medicine

## 2019-02-09 NOTE — Telephone Encounter (Signed)
Ramon Dredge (son on [place]) left message for me to call him regarding refills. Dismissed on 12/7.   I called Ramon Dredge back to see what meds he is needing and Ramon Dredge does not know.  He will call me back. I expressed to Ramon Dredge that his dad needs to find a new pcp.  Ramon Dredge said his dad had been trying all morning.  And he was mad at himself for causing this.  Ramon Dredge will call back.

## 2019-02-10 ENCOUNTER — Telehealth: Payer: Self-pay | Admitting: Family Medicine

## 2019-02-10 ENCOUNTER — Other Ambulatory Visit: Payer: Self-pay | Admitting: Medical

## 2019-02-10 MED ORDER — PANTOPRAZOLE SODIUM 40 MG PO TBEC: DELAYED_RELEASE_TABLET | ORAL | 0 refills | Status: DC

## 2019-02-10 MED ORDER — METFORMIN HCL 500 MG PO TABS: 500.0000 mg | ORAL_TABLET | Freq: Two times a day (BID) | ORAL | 0 refills | Status: DC

## 2019-02-10 MED ORDER — BASAGLAR KWIKPEN 100 UNIT/ML ~~LOC~~ SOPN: 34.0000 [IU] | PEN_INJECTOR | Freq: Every day | SUBCUTANEOUS | 0 refills | Status: DC

## 2019-02-10 MED ORDER — NOVOLOG FLEXPEN 100 UNIT/ML ~~LOC~~ SOPN: 17.0000 [IU] | PEN_INJECTOR | Freq: Two times a day (BID) | SUBCUTANEOUS | 0 refills | Status: DC

## 2019-02-10 NOTE — Telephone Encounter (Signed)
I tried to talk with Ramon Dredge about the [place], but he spelled out [place].

## 2019-02-10 NOTE — Telephone Encounter (Signed)
John Roman called and states his dad needs the [employer], Tresiba, Metformin and Pantoprazole to [facility] [address].  Pt within 30 days of dismissal

## 2019-02-10 NOTE — Telephone Encounter (Signed)
Done  FYI, he is on Basaglar, not [place] long acting insulin

## 2019-02-13 ENCOUNTER — Other Ambulatory Visit: Payer: Self-pay | Admitting: Medical

## 2019-02-16 ENCOUNTER — Other Ambulatory Visit: Payer: Self-pay | Admitting: Medical

## 2019-02-16 ENCOUNTER — Other Ambulatory Visit: Payer: Self-pay | Admitting: Pharmacist

## 2019-02-16 MED ORDER — TRESIBA FLEXTOUCH 100 UNIT/ML ~~LOC~~ SOPN: 34.0000 [IU] | PEN_INJECTOR | Freq: Every day | SUBCUTANEOUS | 1 refills | Status: DC

## 2019-02-16 NOTE — Patient Outreach (Signed)
[facility]) Care Management  02/16/2019  John Roman Aug 17, 1955 [id number]   Patient was called to follow up. HIPAA identifiers were obtained x2.  Patient is a 64 year old male with multiple medical conditions including but not limited to:  Anxiety, BPH, history of CVA, type 2 diabetes, Erectile Dysfunction, GERD, hyperlipidemia, chronic low back pain, and obesity.  Patient was very distressed when I spoke with him. He reported that he had been removed from his Provider's care due to his behavior over a refill a few weeks ago.  He said he was out of Hospital doctor. Patient received Hospital doctor through [employer] Patient Assistance Program last year.  He also said he had been unsuccessful in finding a new provider.He said every one he called was either not accepting new patients or did not answer the phone. Patient's CSNP Nurse had been working with him on finding a new provider. From chart review, it looks as if his [employer] Concierge was involved as well. Patient reported he received a list of providers in the mail but had been unsuccessful in finding a new provider.  From chart review, the patient's son called [name] office and requested a refill on Tresiba. Since patient has been on Basaglar for the last six months, Hospital doctor and Novolog were called into the patient's pharmacy. [facility] to [facility], the patient picked up the Novolog at no cost but was unable to get the Basaglar because it is non-formulary for [facility].  Plan: Instant message was sent to Provider [name] to request a new prescription be sent to the patient's pharmacy for Lantus or Evaristo Bury as they are both on [employer]'s formulary and [name] is not.  Provider [name] sent a new prescription to the patient's pharmacy for [place]. [facility] was called and they have to order the [place] but it will be in tomorrow. Patient said he has enough Basaglar to last him.         Patient also shared that he does not have any more Freestyle  Sensors. His prescription is out of refills and the last sensor he used fell off. He said he called the manufacturer and they are sending him another sensor but he has not received it yet.  Plan: Consult with [name] CSNP Nurse. Follow up with patient in 5-7 business days.  Beecher Mcardle, PharmD, BCACP [facility] Clinical Pharmacist [phone]

## 2019-02-19 ENCOUNTER — Other Ambulatory Visit: Payer: Self-pay

## 2019-02-19 NOTE — Patient Outreach (Addendum)
[facility]) Care Management Chronic Special Needs Program  02/19/2019  Name: John Roman DOB: 09/15/1955  MRN: [id number]   No PCP Mr. Nathanie Ottley is enrolled in a chronic special needs plan for Diabetes. Chronic Care Management Coordinator telephoned client to complete 2021 health risk assessment, follow up on provider issues and to update individualized care plan. RNCM reviewed the chronic care management program, importance of client participation, and taking their care plan to all provider appointments and inpatient facilities.   Client reports he has not established care under another PCP. Mr. Thivierge states his son took the list of providers. "He is supposed to be helping me with it". Client is receptive to [facility] reaching out to his son regarding assistance with establishing care with a new PCP.  Subjective: RNCM spoke to client. He reports a history of diabetes, stroke and HTN. Client states he does not have an advanced directive and declines any paperwork. Client reports he is independent, he continues to do home improvement projects around his house such as putting up a fence. Client states he has all his medications and denies any transportation issues. Client reports he has a [employer], but his sensor came off and states he call the company about a week ago and they are sending him one in the mail, but it has not arrived yet. He states he has called to follow up and it is shipment process. He states in the mean time, he has bought an inexpensive meter and has been pricking his finger, "which I hate". Client reports blood sugar today was 247 after eating. He reports he normally checks his blood sugar after eating. Client is unsure of his last A1C result. Client does not check his feet and has never had an on eye exam. He reports his son is helping him to get a doctor.  Goals Addressed            This Visit's Progress   . Client understands the importance  of follow-up with providers by attending scheduled visits   On track   . Client will not report change from baseline and no repeated symptoms of stroke with in the next 6 months   On track   . Client will report abillity to obtain Medications.   On track   . Client will use Assistive Devices as needed and verbalize understanding of device use   On track   . Client will verbalize knowledge of diabetes self-management as evidenced by Hgb A1C <7 or as defined by provider.   On track    Diabetes self management actions:  Glucose monitoring per provider recommendations  at Healthy  Check feet daily  Visit provider every 3-6 months as directed  Hbg A1C level every 3-6 months.  Eye Exam yearly    . Client will verbalize knowledge of self management of Hypertension as evidences by BP reading of 140/90 or less; or as defined by provider   On track   . Maintain timely refills of diabetic medication as prescribed within the year .   On track   . Obtain annual  Lipid Profile, LDL-C   On track   . Obtain Annual Eye (retinal)  Exam    On track   . Obtain Annual Foot Exam   On track   . Obtain annual screen for micro albuminuria (urine) , nephropathy (kidney problems)   On track   . Obtain Hemoglobin A1C at least 2 times per year  On track   . Patient Stated-client will establish care with primary care provider within the next 3 months.      . Visit Primary Care Provider or Endocrinologist at least 2 times per year    On track     [facility] reinforced the importance of checking blood sugar before meals and discussed 2 hour post prandial range of around 180. RNCM also reinforced how important it is to establish care with a primary care provider as soon as possible.  RNCM called client's son, Benjy Kana, who states he and his wife are assisting client. RNCM reinforced the importance of client establishing care with primary care provider. He reports he has the provider book received from the health care  concierge and that he and his wife are assisting client.  Mr. Helfand is receptive to receiving [place] [phone]) to assist with finding a provider.  Plan: RNCM will reach out to client's son to assist as needed. Send updated care plan to client; unable to send to primary care as client does not have a primary care provider. Updated [facility] pharmacist, Lilian Kapur. RNCM will follow up with client/son within 1-2 weeks. Educational material sent.    Kathyrn Sheriff, RN, MSN, [facility] Chronic Care Management Coordinator [facility] [phone]

## 2019-02-23 ENCOUNTER — Other Ambulatory Visit: Payer: Self-pay

## 2019-02-23 NOTE — Patient Outreach (Signed)
  [facility]) Care Management Chronic Special Needs Program    02/23/2019  Name: John Roman, DOB: [birth year]  MRN: [id number]   [name] is enrolled in a chronic special needs plan for Diabetes.  RNCM returned call to client's son, John Roman who states his wife called RNCM regarding primary care provider appointment. He reports that an appointment is being made for client with a provider in [place]. He is unsure of provider name and states that he will ask his wife to contact RNCM.  Plan: RNCM will continue to follow. RNCM will update [facility] pharmacist.  Kathyrn Sheriff, RN, MSN, [facility] Chronic Care Management Coordinator [facility] [phone]

## 2019-02-23 NOTE — Patient Outreach (Signed)
  [facility]) Care Management Chronic Special Needs Program    02/23/2019  Name: Domenik Trice, DOB: 10-Apr-1955  MRN: [id number]   Mr. Trenten Watchman is enrolled in a chronic special needs plan for Diabetes.  Care Coordination: RNCM called both client and his son to see if client has an appointment to establish care with a primary care provider and to provide further assistance if needed. No answer. HIPPA compliant message left.  RNCM received return call from client's daughter in law, However, RNCM is not consented to speak with client's daughter in law. Client's daughter in law to ask client to give consent for writer to speak with RNCM.   Kathyrn Sheriff, RN, MSN, [facility] Chronic Care Management Coordinator [facility] [phone]

## 2019-02-24 ENCOUNTER — Other Ambulatory Visit: Payer: Self-pay

## 2019-02-24 NOTE — Patient Outreach (Signed)
  [facility]) Care Management Chronic Special Needs Program    02/24/2019  Name: John Roman, DOB: 03/31/1955  MRN: [id number]   [name] is enrolled in a chronic special needs plan for Diabetes. RNCM returned call to client. Client gave verbal consent for RNCM to contact his daughter in law [name] as well as his son [name]. Client when asked if [name] has found client a provider, client states, "I think so, yeah".   RNCM called Mrs. Aura Camps to follow up, no answer. Unable to leave voice message.  RNCM called and spoke with [facility] pharmacist, K. Leavy Cella to update.  Plan: RNCM will continue to outreach to client's daughter in law to follow up on who client's appointment is with and date of appointment.   Kathyrn Sheriff, RN, MSN, [facility] Chronic Care Management Coordinator [facility] [phone]

## 2019-02-26 ENCOUNTER — Ambulatory Visit: Payer: Self-pay

## 2019-02-27 ENCOUNTER — Other Ambulatory Visit: Payer: Self-pay

## 2019-02-27 NOTE — Patient Outreach (Signed)
  [facility]) Care Management Chronic Special Needs Program    02/27/2019  Name: John Roman, DOB: 08/10/1955  MRN: [id number]   Mr. Brad Lieurance is enrolled in a chronic special needs plan for Diabetes.   RNCM called client's daughter in law, John Roman, in an attempt to find out the name of provider that client is scheduled to see and to assist as needed.. No answer. Unable to leave message due to the mailbox was full.  Plan: RNCM will outreach within the next 2-3 weeks.  Kathyrn Sheriff, RN, MSN, [facility] Chronic Care Management Coordinator [facility] [phone]

## 2019-03-02 ENCOUNTER — Other Ambulatory Visit: Payer: Self-pay | Admitting: Medical

## 2019-03-05 ENCOUNTER — Other Ambulatory Visit: Payer: Self-pay | Admitting: Medical

## 2019-03-06 ENCOUNTER — Encounter: Payer: Self-pay | Admitting: Medical-Surgical

## 2019-03-06 ENCOUNTER — Ambulatory Visit (INDEPENDENT_AMBULATORY_CARE_PROVIDER_SITE_OTHER): Payer: HMO | Admitting: Medical-Surgical

## 2019-03-06 ENCOUNTER — Other Ambulatory Visit: Payer: Self-pay

## 2019-03-06 VITALS — BP 153/88 | HR 92 | Temp 97.7°F | Ht 67.0 in | Wt 238.9 lb

## 2019-03-06 DIAGNOSIS — I6300 Cerebral infarction due to thrombosis of unspecified precerebral artery: Secondary | ICD-10-CM | POA: Diagnosis not present

## 2019-03-06 DIAGNOSIS — E1165 Type 2 diabetes mellitus with hyperglycemia: Secondary | ICD-10-CM | POA: Diagnosis not present

## 2019-03-06 DIAGNOSIS — Z1159 Encounter for screening for other viral diseases: Secondary | ICD-10-CM | POA: Diagnosis not present

## 2019-03-06 DIAGNOSIS — I10 Essential (primary) hypertension: Secondary | ICD-10-CM

## 2019-03-06 DIAGNOSIS — K219 Gastro-esophageal reflux disease without esophagitis: Secondary | ICD-10-CM | POA: Diagnosis not present

## 2019-03-06 DIAGNOSIS — E785 Hyperlipidemia, unspecified: Secondary | ICD-10-CM | POA: Diagnosis not present

## 2019-03-06 DIAGNOSIS — Z550 Illiteracy and low-level literacy: Secondary | ICD-10-CM

## 2019-03-06 DIAGNOSIS — Z7689 Persons encountering health services in other specified circumstances: Secondary | ICD-10-CM | POA: Diagnosis not present

## 2019-03-06 MED ORDER — METFORMIN HCL 500 MG PO TABS: 500.0000 mg | ORAL_TABLET | Freq: Two times a day (BID) | ORAL | 0 refills | Status: DC

## 2019-03-06 MED ORDER — ATORVASTATIN CALCIUM 80 MG PO TABS: 80.0000 mg | ORAL_TABLET | Freq: Every day | ORAL | 3 refills | Status: DC

## 2019-03-06 MED ORDER — HYDROCHLOROTHIAZIDE 12.5 MG PO TABS: 25.0000 mg | ORAL_TABLET | Freq: Every day | ORAL | 3 refills | Status: DC

## 2019-03-06 MED ORDER — NOVOLOG FLEXPEN 100 UNIT/ML ~~LOC~~ SOPN: 20.0000 [IU] | PEN_INJECTOR | Freq: Two times a day (BID) | SUBCUTANEOUS | 0 refills | Status: DC

## 2019-03-06 MED ORDER — CLOPIDOGREL BISULFATE 75 MG PO TABS: 75.0000 mg | ORAL_TABLET | Freq: Every day | ORAL | 3 refills | Status: DC

## 2019-03-06 MED ORDER — QUINAPRIL HCL 10 MG PO TABS: 10.0000 mg | ORAL_TABLET | Freq: Every day | ORAL | 3 refills | Status: DC

## 2019-03-06 MED ORDER — ASPIRIN 81 MG PO TBEC: 81.0000 mg | DELAYED_RELEASE_TABLET | Freq: Every day | ORAL | 3 refills | Status: DC

## 2019-03-06 MED ORDER — FREESTYLE LIBRE 14 DAY SENSOR MISC: 3 refills | Status: DC

## 2019-03-06 NOTE — Assessment & Plan Note (Signed)
Continue aspirin 81 mg daily and Plavix 75 mg daily.  Stressed the importance of better blood pressure control.

## 2019-03-06 NOTE — Assessment & Plan Note (Signed)
Checking lipids today. Continue atorvastatin 80mg daily.  ?

## 2019-03-06 NOTE — Assessment & Plan Note (Addendum)
Uncontrolled, 180/95 on arrival to appointment, 153/88 at recheck.  Continue quinapril 10 mg daily.  Checking CBC, CMP, TSH.  Adding HCTZ 12.5 mg daily.  Discussed dietary sodium restriction and foods to limit/avoid.

## 2019-03-06 NOTE — Assessment & Plan Note (Signed)
Discontinue Protonix per patient preference.  Will reevaluate symptoms on his return in 4 weeks and if needed, restart.

## 2019-03-06 NOTE — Progress Notes (Signed)
New Patient Office Visit  Subjective:  Patient ID: John Roman, male    DOB: November 25, 1955  Age: 64 y.o. MRN: [id number]  CC:  Chief Complaint  Patient presents with  . Establish Care    HPI John Roman  Is a pleasant 64 year old male presenting today to establish care. Unable to read or write.  Any correspondence with patient will need to be via telephone at his home number in the chart.  DM- Taking Metformin 544m twice daily, Tresiba 34 units daily, and Novolog ~20 units before breakfast and dinner. Using freestyle libre 14-day sensors for glucose monitoring.  Reports sugars have been in the 110-115 range for the last couple of weeks.  HTN- Taking Quinapril 188mdaily. No dietary modifications, eats salty foods regularly. Does not check BP at home, no monitor and does not know how to use one.  HLD- No dietary modifications. Taking Lipitor 8084maily.  GERD- Taking Protonix 63m74mily. No reflux s/s. Does not want to take Protonix anymore, doesn't feel that he needs it.  Anxiety- manages well by smoking marijuana daily.  Has been smoking marijuana since he was in his 20s.70sPast Medical History:  Diagnosis Date  . ANXIETY 07/04/2007  . ASTHMATIC BRONCHITIS, ACUTE 12/19/2007  . DIABETES MELLITUS, TYPE II 09/28/2006  . ERECTILE DYSFUNCTION 09/28/2006  . GERD 09/28/2006  . Hematochezia 05/29/2010  . HYPERLIPIDEMIA 09/28/2006  . HYPERTENSION 09/28/2006  . Illiterate 09/11/2012  . LOW BACK PAIN 09/28/2006  . OBESITY 09/28/2006  . PLANTAR FASCIITIS, BILATERAL 07/04/2007  . [facility]  Past Surgical History:  Procedure Laterality Date  . APPENDECTOMY      Family History  Problem Relation Age of Onset  . Colon polyps Sister   . Stroke Sister   . Lung cancer Sister   . Coronary artery disease Brother   . Heart attack Brother   . Diabetes Brother        2 brothers   . Hypertension Brother   . Stroke Brother   . Heart attack Mother   . Bone cancer Sister   . Brain cancer  Sister     Social History   Socioeconomic History  . Marital status: Married    Spouse name: Not on file  . Number of children: 1  . Years of education: Not on file  . Highest education level: Not on file  Occupational History  . Occupation: Retired elecClinical biochemistbacco Use  . Smoking status: Former SmokResearch scientist (life sciences)Smokeless tobacco: Never Used  . Tobacco comment: quit at age 52  18bstance and Sexual Activity  . Alcohol use: No  . Drug use: Not Currently    Types: Marijuana    Comment: occasional marijanua use anytime he can  . Sexual activity: Yes    Partners: Female  Other Topics Concern  . Not on file  Social History Narrative  . Not on file   Social Determinants of Health   Financial Resource Strain:   . Difficulty of Paying Living Expenses: Not on file  Food Insecurity: No Food Insecurity  . Worried About RunnCharity fundraiserthe Last Year: Never true  . Ran Out of Food in the Last Year: Never true  Transportation Needs: No Transportation Needs  . Lack of Transportation (Medical): No  . Lack of Transportation (Non-Medical): No  Physical Activity:   . Days of Exercise per Week: Not on file  . Minutes of Exercise per Session: Not on  file  Stress:   . Feeling of Stress : Not on file  Social Connections:   . Frequency of Communication with Friends and Family: Not on file  . Frequency of Social Gatherings with Friends and Family: Not on file  . Attends Religious Services: Not on file  . Active Member of Clubs or Organizations: Not on file  . Attends Archivist Meetings: Not on file  . Marital Status: Not on file  Intimate Partner Violence:   . Fear of Current or Ex-Partner: Not on file  . Emotionally Abused: Not on file  . Physically Abused: Not on file  . Sexually Abused: Not on file    ROS Review of Systems  Constitutional: Negative for chills, fatigue, fever and unexpected weight change.  HENT: Negative for congestion, rhinorrhea and sore throat.    Eyes: Negative for visual disturbance.  Respiratory: Negative for cough, chest tightness and shortness of breath.   Cardiovascular: Negative for chest pain, palpitations and leg swelling.  Gastrointestinal: Negative for abdominal pain, constipation, diarrhea, nausea and vomiting.  Genitourinary: Negative for dysuria.  Neurological: Negative for dizziness, weakness, light-headedness and headaches.  Hematological: Does not bruise/bleed easily.  Psychiatric/Behavioral: Negative for self-injury and suicidal ideas.    Objective:   Today's Vitals: BP (!) 153/88   Pulse 92   Temp 97.7 F (36.5 C) (Oral)   Ht _0  (1.702 m)   Wt 238 lb 14.4 oz (108.4 kg)   SpO2 97%   BMI 37.42 kg/m   Physical Exam Vitals reviewed.  Constitutional:      General: He is not in acute distress.    Appearance: Normal appearance.  HENT:     Head: Normocephalic and atraumatic.  Cardiovascular:     Rate and Rhythm: Normal rate and regular rhythm.     Pulses: Normal pulses.     Heart sounds: Normal heart sounds. No murmur. No friction rub. No gallop.   Pulmonary:     Effort: Pulmonary effort is normal. No respiratory distress.     Breath sounds: Wheezing (Scattered wheezes BLL) present.  Skin:    General: Skin is warm and dry.  Neurological:     Mental Status: He is alert and oriented to person, place, and time.     Sensory: No sensory deficit.     Motor: No weakness.  Psychiatric:        Mood and Affect: Mood normal.        Behavior: Behavior normal.        Thought Content: Thought content normal.        Judgment: Judgment normal.     Assessment & Plan:   Encounter to establish care Hepatitis C screening today.   Declined pneumonia vaccine.  Essential hypertension, benign Uncontrolled, 180/95 on arrival to appointment, 153/88 at recheck.  Continue quinapril 10 mg daily.  Checking CBC, CMP, TSH.  Adding HCTZ 12.5 mg daily.  Discussed dietary sodium restriction and foods to  limit/avoid.  GERD Discontinue Protonix per patient preference.  Will reevaluate symptoms on his return in 4 weeks and if needed, restart.  Uncontrolled type 2 diabetes mellitus with hyperglycemia (HCC) Continue twice daily Metformin, daily Tresiba, and twice daily NovoLog injections.  Refills provided for freestyle libre 14-day sensor as this seems to increase his compliance with glucose monitoring and management.  Checking hemoglobin A1c today.  Referring to endocrinology.  This has been done by previous practice before being dismissed with no successful follow-through.  Need to make sure to  reach out to patient via telephone only using his home number.  Stressed the importance of follow-through to optimize management of diabetes with patient who verbalized understanding of the plan.  Hyperlipidemia Checking lipids today.  Continue atorvastatin 80 mg daily.  CVA (cerebral vascular accident) ([place]) Continue aspirin 81 mg daily and Plavix 75 mg daily.  Stressed the importance of better blood pressure control.  Illiteracy Needs to be contacted by telephone.   Outpatient Encounter Medications as of 03/06/2019  Medication Sig  . aspirin 81 MG EC tablet Take 1 tablet (81 mg total) by mouth daily.  Marland Kitchen atorvastatin (LIPITOR) 80 MG tablet Take 1 tablet (80 mg total) by mouth daily at 6 PM.  . clopidogrel (PLAVIX) 75 MG tablet Take 1 tablet (75 mg total) by mouth daily.  . insulin aspart (NOVOLOG FLEXPEN) 100 UNIT/ML FlexPen Inject 20 Units into the skin 2 (two) times daily before a meal.  . insulin degludec (TRESIBA FLEXTOUCH) 100 UNIT/ML SOPN FlexTouch Pen Inject 0.34 mLs (34 Units total) into the skin daily.  . metFORMIN (GLUCOPHAGE) 500 MG tablet Take 1 tablet (500 mg total) by mouth 2 (two) times daily with a meal.  . pantoprazole (PROTONIX) 40 MG tablet 1 tablet po daily  . quinapril (ACCUPRIL) 10 MG tablet Take 1 tablet (10 mg total) by mouth daily.  . [DISCONTINUED] aspirin 81 MG EC tablet  Take 1 tablet (81 mg total) by mouth daily.  . [DISCONTINUED] atorvastatin (LIPITOR) 80 MG tablet TAKE 1 TABLET (80 MG TOTAL) BY MOUTH DAILY AT 6 PM.  . [DISCONTINUED] clopidogrel (PLAVIX) 75 MG tablet Take 1 tablet (75 mg total) by mouth daily.  . [DISCONTINUED] insulin aspart (NOVOLOG FLEXPEN) 100 UNIT/ML FlexPen Inject 17 Units into the skin 2 (two) times daily before a meal.  . [DISCONTINUED] metFORMIN (GLUCOPHAGE) 500 MG tablet Take 1 tablet (500 mg total) by mouth 2 (two) times daily with a meal.  . [DISCONTINUED] quinapril (ACCUPRIL) 10 MG tablet Take 1 tablet (10 mg total) by mouth daily.  . blood glucose meter kit and supplies KIT Check blood sugar up to four times daily. Once in the morning and three times daily with meals. (FOR ICD-9 250.00, 250.01). (Patient not taking: Reported on 03/06/2019)  . blood glucose meter kit and supplies Insurance will cover libra meter. Please dispense lancets and test strips for Libra meter. (Patient not taking: Reported on 03/06/2019)  . Continuous Blood Gluc Receiver (FREESTYLE LIBRE 14 DAY READER) DEVI See admin instructions. Use to test blood sugar  . Continuous Blood Gluc Sensor (FREESTYLE LIBRE 14 DAY SENSOR) MISC Use every 14 days  . hydrochlorothiazide (HYDRODIURIL) 12.5 MG tablet Take 2 tablets (25 mg total) by mouth daily.  . Insulin Pen Needle (PEN NEEDLES) 32G X 4 MM MISC 180 each by Does not apply route 2 (two) times daily. (Patient not taking: Reported on 03/06/2019)  . [DISCONTINUED] ASPIRIN 81 PO Take 1 tablet by mouth daily.  . [DISCONTINUED] Continuous Blood Gluc Sensor (FREESTYLE LIBRE 14 DAY SENSOR) MISC Use every 14 days   No facility-administered encounter medications on file as of 03/06/2019.    Follow-up: Return in about 4 weeks (around 04/03/2019) for HTN follow up.   Clearnce Sorrel, DNP, APRN, FNP-BC [facility]

## 2019-03-06 NOTE — Assessment & Plan Note (Addendum)
Continue twice daily Metformin, daily Tresiba, and twice daily NovoLog injections.  Refills provided for freestyle libre 14-day sensor as this seems to increase his compliance with glucose monitoring and management.  Checking hemoglobin A1c today.  Referring to endocrinology.  This has been done by previous practice before being dismissed with no successful follow-through.  Need to make sure to reach out to patient via telephone only using his home number.  Stressed the importance of follow-through to optimize management of diabetes with patient who verbalized understanding of the plan.

## 2019-03-06 NOTE — Assessment & Plan Note (Signed)
Needs to be contacted by telephone.

## 2019-03-09 LAB — HEPATITIS C ANTIBODY
Hepatitis C Ab: NONREACTIVE
SIGNAL TO CUT-OFF: 0.06 (ref <1.00)

## 2019-03-09 LAB — COMPLETE METABOLIC PANEL WITH GFR
AG Ratio: 1.8 (calc) (ref 1.0–2.5)
ALT: 18 U/L (ref 9–46)
AST: 19 U/L (ref 10–35)
Albumin: 4.2 g/dL (ref 3.6–5.1)
Alkaline phosphatase (APISO): 73 U/L (ref 35–144)
BUN: 15 mg/dL (ref 7–25)
CO2: 24 mmol/L (ref 20–32)
Calcium: 9.6 mg/dL (ref 8.6–10.3)
Chloride: 103 mmol/L (ref 98–110)
Creat: 0.89 mg/dL (ref 0.70–1.25)
GFR, Est African American: 104 mL/min/{1.73_m2} (ref >=60)
GFR, Est Non African American: 90 mL/min/{1.73_m2} (ref >=60)
Globulin: 2.4 g/dL (calc) (ref 1.9–3.7)
Glucose, Bld: 258 mg/dL — ABNORMAL HIGH (ref 65–99)
Potassium: 4.5 mmol/L (ref 3.5–5.3)
Sodium: 138 mmol/L (ref 135–146)
Total Bilirubin: 0.4 mg/dL (ref 0.2–1.2)
Total Protein: 6.6 g/dL (ref 6.1–8.1)

## 2019-03-09 LAB — CBC
HCT: 41.4 % (ref 38.5–50.0)
Hemoglobin: 13.6 g/dL (ref 13.2–17.1)
MCH: 26.5 pg — ABNORMAL LOW (ref 27.0–33.0)
MCHC: 32.9 g/dL (ref 32.0–36.0)
MCV: 80.5 fL (ref 80.0–100.0)
MPV: 10.3 fL (ref 7.5–12.5)
Platelets: 258 10*3/uL (ref 140–400)
RBC: 5.14 10*6/uL (ref 4.20–5.80)
RDW: 13.6 % (ref 11.0–15.0)
WBC: 7.7 10*3/uL (ref 3.8–10.8)

## 2019-03-09 LAB — LIPID PANEL
Cholesterol: 155 mg/dL (ref <200)
HDL: 36 mg/dL — ABNORMAL LOW (ref >=40)
LDL Cholesterol (Calc): 99 mg/dL (calc)
Non-HDL Cholesterol (Calc): 119 mg/dL (calc) (ref <130)
Total CHOL/HDL Ratio: 4.3 (calc) (ref <5.0)
Triglycerides: 105 mg/dL (ref <150)

## 2019-03-09 LAB — HEMOGLOBIN A1C
Hgb A1c MFr Bld: 9.9 % of total Hgb — ABNORMAL HIGH (ref <5.7)
Mean Plasma Glucose: 237 (calc)
eAG (mmol/L): 13.2 (calc)

## 2019-03-09 LAB — TSH: TSH: 2.07 mIU/L (ref 0.40–4.50)

## 2019-03-11 ENCOUNTER — Telehealth: Payer: Self-pay | Admitting: Pharmacist

## 2019-03-11 NOTE — Patient Outreach (Signed)
[facility]) Care Management  03/11/2019  Brax Walen 03/26/1955 [id number]   Patient was called to follow up after his PCP appointment. HIPAA identifiers were obtained.  Patient is a 64 year old male with multiple medical conditions including but not limited to:  Anxiety, BPH, history of CVA, type 2 diabetes, Erectile Dysfunction, GERD, hyperlipidemia, chronic low back pain, and obesity.  Patient saw his new PCP, Samuel Bouche, NP on 03/06/2019.  The only medication change was pantoprazole was discontinued.  Refills were sent to his pharmacy for [employer]. Medications Reviewed Today    Reviewed by Ranelle Oyster, CMA (Certified Medical Assistant) on 03/06/19 at [place] List Status: <None>  Medication Order Taking? Sig Documenting Provider Last Dose Status Informant  aspirin 81 MG EC tablet [id number] Yes Take 1 tablet (81 mg total) by mouth daily. Caryl Ada Taking Active   ASPIRIN 81 PO [id number] No Take 1 tablet by mouth daily. [provider] Not Taking Active   atorvastatin (LIPITOR) 80 MG tablet [id number] Yes TAKE 1 TABLET (80 MG TOTAL) BY MOUTH DAILY AT 6 PM. [name], Camelia Eng, PA-C Taking Active   blood glucose meter kit and supplies [id number] No Insurance will cover libra meter. Please dispense lancets and test strips for Libra meter.  Patient not taking: Reported on 03/06/2019   Carlena Hurl, PA-C Not Taking Active   blood glucose meter kit and supplies KIT [id number] No Check blood sugar up to four times daily. Once in the morning and three times daily with meals. (FOR ICD-9 250.00, 250.01).  Patient not taking: Reported on 03/06/2019   Melanee Spry, MD Not Taking Active   clopidogrel (PLAVIX) 75 MG tablet [id number] Yes Take 1 tablet (75 mg total) by mouth daily. [name], Camelia Eng, PA-C Taking Active   Continuous Blood Gluc Receiver (FREESTYLE LIBRE 14 DAY READER) DEVI [id number] No See admin instructions. Use to test  blood sugar [provider] Not Taking Active   Continuous Blood Gluc Sensor (FREESTYLE LIBRE [place]) MISC [id number] No Use every 14 days [provider] Not Taking Active   insulin aspart (NOVOLOG FLEXPEN) 100 UNIT/ML FlexPen [id number] Yes Inject 17 Units into the skin 2 (two) times daily before a meal. [name], Camelia Eng, PA-C Taking Active            Med Note Juleen China, Deno Etienne   Thu Feb 19, 2019 11:54 AM) Reports takes 20 units twice a day.  insulin degludec (TRESIBA FLEXTOUCH) 100 UNIT/ML SOPN FlexTouch Pen [id number] Yes Inject 0.34 mLs (34 Units total) into the skin daily. [name], Camelia Eng, PA-C Taking Active   Insulin Pen Needle (PEN NEEDLES) 32G X 4 MM MISC [id number] No 180 each by Does not apply route 2 (two) times daily.  Patient not taking: Reported on 03/06/2019   Carlena Hurl, PA-C Not Taking Active   metFORMIN (GLUCOPHAGE) 500 MG tablet [id number] Yes Take 1 tablet (500 mg total) by mouth 2 (two) times daily with a meal. [name], Camelia Eng, PA-C Taking Active   pantoprazole (PROTONIX) 40 MG tablet [id number] Yes 1 tablet po daily [name], Camelia Eng, PA-C Taking Active   quinapril (ACCUPRIL) 10 MG tablet [id number] Yes Take 1 tablet (10 mg total) by mouth daily. [name], Camelia Eng, PA-C Taking Active           Since patient has a new PCP, patient assistance forms for Tyler Aas and Novolog will be sent to the patient and his  provider.  Plan: I will route patient assistance letter to [place] technician who will coordinate patient assistance program application process for medications listed above.  [facility] pharmacy technician will assist with obtaining all required documents from both patient and provider(s) and submit application(s) once completed.    Follow up in 6-8 weeks  Elayne Guerin, PharmD, [place] Clinical Pharmacist [phone]

## 2019-03-12 ENCOUNTER — Other Ambulatory Visit: Payer: Self-pay | Admitting: Pharmacy Technician

## 2019-03-12 NOTE — Patient Outreach (Signed)
[facility]) Care Management  03/12/2019  Kelsey Edman 1955-09-01 [id number]                                       Medication Assistance Referral  Referral From: [facility] Katina B.  Medication/Company: Evaristo Bury and Novolog / [employer] Patient application portion:  Mining engineer portion: Faxed  to Christen Butter, NP Provider address/fax verified via: Office website     Follow up:  Will follow up with patient in 5-15 business days to confirm application(s) have been received.  Jayma Volpi P. Haevyn Ury, CPhT [facility] Care Management [phone]

## 2019-03-18 ENCOUNTER — Other Ambulatory Visit: Payer: Self-pay

## 2019-03-18 NOTE — Patient Outreach (Signed)
  [facility]) Care Management Chronic Special Needs Program    03/18/2019  Name: Rush Salce, DOB: 05-21-1955  MRN: [id number]   Mr. Clemon Devaul is enrolled in a chronic special needs plan for Diabetes. RNCM called to follow up with client- new primary care provider. No answer. HIPPA compliant message left.  Plan: RNCM will continue to outreach to client.  Kathyrn Sheriff, RN, MSN, [facility] Chronic Care Management Coordinator [facility] [phone]

## 2019-03-19 ENCOUNTER — Ambulatory Visit: Payer: Self-pay

## 2019-03-24 ENCOUNTER — Other Ambulatory Visit: Payer: Self-pay | Admitting: Pharmacy Technician

## 2019-03-24 NOTE — Patient Outreach (Signed)
[facility]) Care Management  03/24/2019  John Roman 1955/07/21 [id number]   Received both patient and provider portion(s) of patient assistance application(s) for [place] and Novolog. Faxed completed application and required documents into [employer].  Will follow up with company(ies) in 7-10 business days to check status of application(s).  [name] P. [name], CPhT [facility] Care [facility] [phone]

## 2019-03-25 ENCOUNTER — Ambulatory Visit: Payer: Self-pay

## 2019-03-27 ENCOUNTER — Ambulatory Visit: Payer: Self-pay

## 2019-03-31 ENCOUNTER — Other Ambulatory Visit: Payer: Self-pay | Admitting: Pharmacy Technician

## 2019-03-31 DIAGNOSIS — E1165 Type 2 diabetes mellitus with hyperglycemia: Secondary | ICD-10-CM | POA: Diagnosis not present

## 2019-03-31 DIAGNOSIS — Z794 Long term (current) use of insulin: Secondary | ICD-10-CM | POA: Diagnosis not present

## 2019-03-31 NOTE — Patient Outreach (Signed)
[facility]) Care Management  03/31/2019  Nolan Tuazon [birth year] [id number]    Care coordination call placed to Novo Nordisk in regards to patient's application for [place] and Novolog.  Spoke to [place] who informed based on patient's reported income he would need to apply for LIS. If denied then a copy of the denial letter would need to be sent to [employer].  Will route note to [facility] RPh Nunzio Cobbs for assistance.  Sharron Simpson P. Crixus Mcaulay, CPhT [facility] Care Management [phone]

## 2019-04-01 ENCOUNTER — Ambulatory Visit: Payer: Self-pay

## 2019-04-03 ENCOUNTER — Ambulatory Visit (INDEPENDENT_AMBULATORY_CARE_PROVIDER_SITE_OTHER): Payer: HMO | Admitting: Medical-Surgical

## 2019-04-03 ENCOUNTER — Other Ambulatory Visit: Payer: Self-pay

## 2019-04-03 ENCOUNTER — Encounter: Payer: Self-pay | Admitting: Medical-Surgical

## 2019-04-03 ENCOUNTER — Telehealth: Payer: Self-pay | Admitting: Pharmacist

## 2019-04-03 VITALS — BP 132/75 | HR 71 | Temp 97.6°F | Ht 67.0 in | Wt 234.0 lb

## 2019-04-03 DIAGNOSIS — I10 Essential (primary) hypertension: Secondary | ICD-10-CM | POA: Diagnosis not present

## 2019-04-03 NOTE — Progress Notes (Signed)
Subjective:    CC: Hypertension follow-up  HPI: Pleasant 64 year old male presenting today for hypertension follow-up.  Was seen approximately 4 weeks ago as a new patient.  Blood pressure at that time was uncontrolled on quinapril 10 mg daily.  Added hydrochlorothiazide 12.5 mg daily and strongly recommended limiting dietary sodium.  Patient reports he has eliminated sausage from his diet and been eating lower sodium foods.  Taking medications as prescribed with no missed doses.  Tolerating medications well.  Denies chest pain, shortness of breath, lower extremity swelling, headaches, dizziness, and palpitations.  Of note: Was able to establish a relationship with an endocrinology office who will be helping manage his diabetes.  Monitoring his blood sugars using the 14-day sensors.  Titrates his fast acting insulin depending on his blood glucose readings.  Will follow up with endocrinology in 4 weeks.  I reviewed the past medical history, family history, social history, surgical history, and allergies today and no changes were needed.  Please see the problem list section below in epic for further details.  Past Medical History: Past Medical History:  Diagnosis Date  . ANXIETY 07/04/2007  . ASTHMATIC BRONCHITIS, ACUTE 12/19/2007  . DIABETES MELLITUS, TYPE II 09/28/2006  . ERECTILE DYSFUNCTION 09/28/2006  . GERD 09/28/2006  . Hematochezia 05/29/2010  . HYPERLIPIDEMIA 09/28/2006  . HYPERTENSION 09/28/2006  . Illiterate 09/11/2012  . LOW BACK PAIN 09/28/2006  . OBESITY 09/28/2006  . PLANTAR FASCIITIS, BILATERAL 07/04/2007  . Stroke [facility])    Past Surgical History: Past Surgical History:  Procedure Laterality Date  . APPENDECTOMY     Social History: Social History   Socioeconomic History  . Marital status: Married    Spouse name: Not on file  . Number of children: 1  . Years of education: Not on file  . Highest education level: Not on file  Occupational History  . Occupation: Retired  Personnel officer  Tobacco Use  . Smoking status: Former Games developer  . Smokeless tobacco: Never Used  . Tobacco comment: quit at age 62  Substance and Sexual Activity  . Alcohol use: No  . Drug use: Not Currently    Types: Marijuana    Comment: occasional marijanua use anytime he can  . Sexual activity: Yes    Partners: Female  Other Topics Concern  . Not on file  Social History Narrative  . Not on file   Social Determinants of Health   Financial Resource Strain:   . Difficulty of Paying Living Expenses: Not on file  Food Insecurity: No Food Insecurity  . Worried About Programme researcher, broadcasting/film/video in the Last Year: Never true  . Ran Out of Food in the Last Year: Never true  Transportation Needs: No Transportation Needs  . Lack of Transportation (Medical): No  . Lack of Transportation (Non-Medical): No  Physical Activity:   . Days of Exercise per Week: Not on file  . Minutes of Exercise per Session: Not on file  Stress:   . Feeling of Stress : Not on file  Social Connections:   . Frequency of Communication with Friends and Family: Not on file  . Frequency of Social Gatherings with Friends and Family: Not on file  . Attends Religious Services: Not on file  . Active Member of Clubs or Organizations: Not on file  . Attends Banker Meetings: Not on file  . Marital Status: Not on file   Family History: Family History  Problem Relation Age of Onset  . Colon polyps Sister   .  Stroke Sister   . Lung cancer Sister   . Coronary artery disease Brother   . Heart attack Brother   . Diabetes Brother        2 brothers   . Hypertension Brother   . Stroke Brother   . Heart attack Mother   . Bone cancer Sister   . Brain cancer Sister    Allergies: No Known Allergies Medications: See med rec.  Review of Systems: No fevers, chills, night sweats, weight loss, chest pain, or shortness of breath.   Objective:    General: Well Developed, well nourished, and in no acute distress.   Neuro: Alert and oriented x3.  HEENT: Normocephalic, atraumatic.  Skin: Warm and dry. Cardiac: Regular rate and rhythm, no murmurs rubs or gallops, no lower extremity edema.  Respiratory: Clear to auscultation bilaterally. Not using accessory muscles, speaking in full sentences.   Impression and Recommendations:    Essential hypertension Continue quinapril 10 mg daily and hydrochlorothiazide 12.5 mg daily.  Continue low-sodium dietary modifications.  Recommend increasing intentional exercise and weight loss.  Return in about 3 months (around 07/01/2019) for HTN, DM follow up.  ___________________________________________ Clearnce Sorrel, DNP, APRN, FNP-BC [facility] [place]

## 2019-04-03 NOTE — Patient Outreach (Signed)
[facility]) Care Management  04/03/2019  John Roman 12/26/1955 [id number]  Patient was called to complete an Extra Help/LIS application online because [employer] said he needed to complete one. Although patient's income still appeared to be above the income cut off, an application was completed with him via telephone.  HIPAA identifiers were obtained.  Patient was advised to be looking out of a determination letter from [employer] and hold on to the letter as we will need a copy of it.    Plan: Follow up with the patient in 1 month to see if he received the determination letter.  Beecher Mcardle, PharmD, BCACP [facility] Clinical Pharmacist [phone]    Beecher Mcardle, PharmD, [facility] Clinical Pharmacist [phone]

## 2019-04-06 ENCOUNTER — Other Ambulatory Visit: Payer: Self-pay

## 2019-04-06 NOTE — Patient Outreach (Signed)
[facility]) Care Management Chronic Special Needs Program  04/06/2019  Name: John Roman DOB: 1955-05-22  MRN: [id number]  John Roman is enrolled in a chronic special needs plan for Diabetes. RNCM called to follow up with client post establishment of care with primary care provider. RNCM reviewed and updated care plan.  Subjective: client reports he has been to see primary care and endocrinologist. Client states, "I am trying". Blood sugar today was 140 after eating. He reports he has all his medications at this time.   A1C 9.9 on 03/06/2019 previously 11.3 on 07/21/2018.   Goals Addressed            This Visit's Progress   . COMPLETED: Client understands the importance of follow-up with providers by attending scheduled visits   On track    Discussed the importance of following up with your providers as scheduled. Client voiced the importance. You are doing a [place] job this year!    . Client will not report change from baseline and no repeated symptoms of stroke with in the next 6 months   On track    It is important to see your doctor as scheduled. Take your medications as instructed by your doctor/provider.  Stroke Symptoms Spot a stroke F.A.S.T. FACE DROOPING Does one side of the face droop or is it numb? Ask the person to smile. ARM WEAKNESS Is one arm weak or numb? Ask the person to raise both arms. Does one arm drift downward? SPEECH DIFFICULTY Is speech slurred, are they unable to speak, or are they hard to understand? Ask the person to repeat a simple sentence, like "the sky is blue." Is the sentence repeated correctly? TIME TO CALL 9-1-1 If the person shows any of these symptoms, even if the symptoms go away, call 9-1-1 and get them to the hospital immediately.    . COMPLETED: Client will report abillity to obtain Medications.       Client reports he has all his medications at this time.    . COMPLETED: Client will use Assistive Devices as  needed and verbalize understanding of device use       Client denies any difficulty with use of glucose meter.    . Client will verbalize knowledge of diabetes self-management as evidenced by Hgb A1C <7 or as defined by provider.   On track    Diabetes self management actions:  Glucose monitoring per provider recommendations  at Healthy  Check feet daily  Visit provider every 3-6 months as directed  Hbg A1C level every 3-6 months.  Eye Exam yearly Ask your doctor, "what is my Target A1C goal?" Ask your doctor, "what is my Target blood sugar range?"    . Client will verbalize knowledge of self management of Hypertension as evidences by BP reading of 140/90 or less; or as defined by provider   On track    High blood pressure self-management actions: Follow up with your doctor as scheduled. Take your medications as prescribed by your doctor. Ask your doctor "what is my target blood pressure range". Monitor your blood pressure and take results to your doctor's appointment.  Monitor the amount of salt you are eating. Continue to exercise as tolerated and remain active.    Marland Kitchen HEMOGLOBIN A1C < 8       Per diabetes doctor your A1C goal is less than 8.0  A1C 9.9 on 03/06/2019.  A1C was 11.3 on 07/21/2018.     . Maintain timely refills of  diabetic medication as prescribed within the year .   On track    It is important for you to follow up with your doctors as scheduled for recommended labs/procedures and prescription refills  Please call your doctor or your nurse care management coordinator [phone]), if you are having any difficulties obtaining medications. You may also call the [place] network pharmacist that is helping you with medication assistance [phone]).    . COMPLETED: Obtain annual  Lipid Profile, LDL-C       Done 03/06/2019    . Obtain Annual Eye (retinal)  Exam    On track    It is important for you to see your doctor for recommended yearly exam.    .  Obtain Annual Foot Exam   On track    It is important for you to see your doctor as scheduled for recommended exam.    . Obtain annual screen for micro albuminuria (urine) , nephropathy (kidney problems)   On track    It is important to see your provider as scheduled for recommended labs/procedures. This test is an indication of how your kidneys are working.    . Obtain Hemoglobin A1C at least 2 times per year   On track     A1C 11.3 on 07/21/2018.  A1C down to 9.9 on 03/06/2019    . COMPLETED: Patient Stated-client will establish care with primary care provider within the next 3 months.       Primary care visit established on 03/06/2019.    Marland Kitchen Visit Primary Care Provider or Endocrinologist at least 2 times per year    On track    It is important for you to see your provider as scheduled for recommended labs/procedures. You are doing a [place] job this year!   Last office visit with your primary care provider was 04/03/2019. Next visit due the end of May 2021. Please call to schedule this visit.   Last office visit with diabetes doctor 03/31/2019. Next appointment is scheduled for 04/30/2019      RNCM encouraged client to call call his pharmacy for a refill prior to running out of medications. RNCM encouraged client to call [facility] care management coordinator as needed.  Plan: RNCM will reach out per Tier level in 6 months or sooner as indicated. RNCM will send care plan to client, send updated care plan to primary care provider. RNCM will update [facility] Pharmacist.   Thea Silversmith, RN, MSN, [place] [phone]    .

## 2019-04-17 ENCOUNTER — Other Ambulatory Visit: Payer: Self-pay | Admitting: Medical

## 2019-04-22 ENCOUNTER — Other Ambulatory Visit: Payer: Self-pay | Admitting: Medical-Surgical

## 2019-04-29 ENCOUNTER — Other Ambulatory Visit: Payer: Self-pay | Admitting: Medical

## 2019-04-29 ENCOUNTER — Other Ambulatory Visit: Payer: Self-pay | Admitting: Medical-Surgical

## 2019-04-30 ENCOUNTER — Telehealth: Payer: Self-pay | Admitting: Medical-Surgical

## 2019-04-30 NOTE — Telephone Encounter (Signed)
PT came into the office today. He stated, "My insurance company doesn't cover visits with [employer]. Can you send the referral to another office within the [facility] system?"  Please advise.

## 2019-05-04 ENCOUNTER — Other Ambulatory Visit: Payer: Self-pay | Admitting: Pharmacist

## 2019-05-04 NOTE — Patient Outreach (Signed)
[facility]) Care Management  05/04/2019  Zenas Santa 1955-10-03 [id number]   Patient was called to follow up on LIS Determination letter. Unfortunately, he did not answer his phone. HIPAA compliant message was left on his voicemail.  Plan:  [facility] pharmacy case will be closed as our team is transitioning from the [facility] Care Management Department into the [facility] Quality Department and will no longer be using [facility] for documentation purposes.   [facility] pharmacy technician will continue to assist patient with medication assistance program applications until complete.     Will route note to [facility] Pharmacy Technician, Pattricia Boss, CPhT  Beecher Mcardle, PharmD, [facility] Clinical Pharmacist [phone]

## 2019-05-05 ENCOUNTER — Ambulatory Visit: Payer: Self-pay | Admitting: Pharmacist

## 2019-05-05 ENCOUNTER — Other Ambulatory Visit: Payer: Self-pay | Admitting: Pharmacist

## 2019-05-06 NOTE — Patient Outreach (Signed)
[facility]) Care Management  05/06/2019  Lynx Goodrich 01/08/1956 [id number]  Patient called me back. HIPAA identifiers were obtained. Patient was asked about the Social Security Benefits Letter and he said he had not received it yet but would be looking out for it.  Plan: Patient will be followed by [facility] Pharmacy Technician, Pattricia Boss, CPhT.  Beecher Mcardle, PharmD, BCACP [facility] Clinical Pharmacist [phone]

## 2019-05-06 NOTE — Telephone Encounter (Signed)
Sent referral to [facility] Endocrinology they will call and schedule with patient for good appointment time - CF

## 2019-05-07 ENCOUNTER — Other Ambulatory Visit: Payer: Self-pay | Admitting: Pharmacy Technician

## 2019-05-07 NOTE — Patient Outreach (Signed)
[facility]) Care Management  05/07/2019  John Roman 01/30/1956 [id number]   Unsuccessful call placed to patient regarding patient assistance receipt of LIS determination letter from Social Security, HIPAA compliant voicemail left. [employer] is requesting this information in order to process his application for [place] and [employer]. An online application was completed with [facility] RPh Nunzio Cobbs back on April 03, 2019.Typical turn around time for a determination is 4-6 weeks.    Follow up:  Will attempt a 2nd outreach call in the next 7-10 business days if call is not returned.  John Roman, CPhT [facility]  [phone]

## 2019-05-12 ENCOUNTER — Other Ambulatory Visit: Payer: Self-pay | Admitting: Pharmacy Technician

## 2019-05-12 NOTE — Patient Outreach (Signed)
[facility]) Care Management  05/12/2019  Altan Kraai 1955/05/24 [id number]   Incoming call from patient regarding patient assistance application(s) for [place] and [employer] with [employer] , HIPAA identifiers verified.   Patient was returning my call. Patient informs he has not received a determination letter from social security in the mail. The online application was completed on 04/03/2019 with the assistance of [facility] RPh Nunzio Cobbs. Patient was informed to contact either myself or [facility] RPh Nunzio Cobbs when he has received the letter.  Follow up:  Will route note to [facility] RPh Nunzio Cobbs  for case closure. Will gladly re open the case when the information is mailed back in.  [place] P. Santiago Graf, CPhT [facility] [employer] [employer]  [phone]

## 2019-05-18 ENCOUNTER — Other Ambulatory Visit: Payer: Self-pay

## 2019-05-20 ENCOUNTER — Encounter: Payer: Self-pay | Admitting: Endocrinology

## 2019-05-20 ENCOUNTER — Other Ambulatory Visit: Payer: Self-pay

## 2019-05-20 ENCOUNTER — Ambulatory Visit (INDEPENDENT_AMBULATORY_CARE_PROVIDER_SITE_OTHER): Payer: HMO | Admitting: Endocrinology

## 2019-05-20 VITALS — BP 140/80 | HR 91 | Ht 67.0 in | Wt 240.8 lb

## 2019-05-20 DIAGNOSIS — E1165 Type 2 diabetes mellitus with hyperglycemia: Secondary | ICD-10-CM | POA: Diagnosis not present

## 2019-05-20 LAB — POCT GLYCOSYLATED HEMOGLOBIN (HGB A1C): Hemoglobin A1C: 8.4 % — AB (ref 4.0–5.6)

## 2019-05-20 MED ORDER — TRESIBA FLEXTOUCH 100 UNIT/ML ~~LOC~~ SOPN: 44.0000 [IU] | PEN_INJECTOR | Freq: Every day | SUBCUTANEOUS | 5 refills | Status: DC

## 2019-05-20 NOTE — Patient Instructions (Addendum)
good diet and exercise significantly improve the control of your diabetes.  please let me know if you wish to be referred to a dietician.  high blood sugar is very risky to your health.  you should see an eye doctor and dentist every year.  It is very important to get all recommended vaccinations.  Controlling your blood pressure and cholesterol drastically reduces the damage diabetes does to your body.  Those who smoke should quit.  Please discuss these with your doctor.  Please increase the Tresiba to 44 units daily, and Please continue the same Novolog. Please come back for a follow-up appointment in 2 months.

## 2019-05-20 NOTE — Progress Notes (Signed)
Subjective:    Patient ID: John Roman, male    DOB: 1955/08/11, 64 y.o.   MRN: [id number]  HPI pt is referred by Christen Butter, NP, for diabetes.  Pt states DM was dx'ed in 2010; it is complicated by CVA and PN; he has been on insulin since 2020; pt says his diet and exercise are not good; he has never had pancreatitis, pancreatic surgery, severe hypoglycemia or DKA.  I reviewed continuous glucose monitor data.  Glucose varies from 90-320.  There is not much trend throughout the day. Past Medical History:  Diagnosis Date  . ANXIETY 07/04/2007  . ASTHMATIC BRONCHITIS, ACUTE 12/19/2007  . DIABETES MELLITUS, TYPE II 09/28/2006  . ERECTILE DYSFUNCTION 09/28/2006  . GERD 09/28/2006  . Hematochezia 05/29/2010  . HYPERLIPIDEMIA 09/28/2006  . HYPERTENSION 09/28/2006  . Illiterate 09/11/2012  . LOW BACK PAIN 09/28/2006  . OBESITY 09/28/2006  . PLANTAR FASCIITIS, BILATERAL 07/04/2007  . Stroke [facility])     Past Surgical History:  Procedure Laterality Date  . APPENDECTOMY      Social History   Socioeconomic History  . Marital status: Married    Spouse name: Not on file  . Number of children: 1  . Years of education: Not on file  . Highest education level: Not on file  Occupational History  . Occupation: Retired Personnel officer  Tobacco Use  . Smoking status: Former Games developer  . Smokeless tobacco: Never Used  . Tobacco comment: quit at age 25  Substance and Sexual Activity  . Alcohol use: No  . Drug use: Not Currently    Types: Marijuana    Comment: occasional marijanua use anytime he can  . Sexual activity: Yes    Partners: Female  Other Topics Concern  . Not on file  Social History Narrative  . Not on file   Social Determinants of Health   Financial Resource Strain:   . Difficulty of Paying Living Expenses:   Food Insecurity: No Food Insecurity  . Worried About Programme researcher, broadcasting/film/video in the Last Year: Never true  . Ran Out of Food in the Last Year: Never true  Transportation Needs: No  Transportation Needs  . Lack of Transportation (Medical): No  . Lack of Transportation (Non-Medical): No  Physical Activity:   . Days of Exercise per Week:   . Minutes of Exercise per Session:   Stress:   . Feeling of Stress :   Social Connections:   . Frequency of Communication with Friends and Family:   . Frequency of Social Gatherings with Friends and Family:   . Attends Religious Services:   . Active Member of Clubs or Organizations:   . Attends Banker Meetings:   Marland Kitchen Marital Status:   Intimate Partner Violence:   . Fear of Current or Ex-Partner:   . Emotionally Abused:   Marland Kitchen Physically Abused:   . Sexually Abused:     Current Outpatient Medications on File Prior to Visit  Medication Sig Dispense Refill  . aspirin 81 MG EC tablet Take 1 tablet (81 mg total) by mouth daily. 90 tablet 3  . atorvastatin (LIPITOR) 80 MG tablet Take 1 tablet (80 mg total) by mouth daily at 6 PM. 90 tablet 3  . clopidogrel (PLAVIX) 75 MG tablet Take 1 tablet (75 mg total) by mouth daily. 90 tablet 3  . Continuous Blood Gluc Receiver (FREESTYLE LIBRE 14 DAY READER) DEVI See admin instructions. Use to test blood sugar    . Continuous Blood Gluc  Sensor (FREESTYLE LIBRE 14 DAY SENSOR) MISC Use every 14 days 6 each 3  . insulin aspart (NOVOLOG FLEXPEN) 100 UNIT/ML FlexPen Inject 20 Units into the skin 2 (two) times daily before a meal. 9 mL 0  . Insulin Pen Needle (PEN NEEDLES) 32G X 4 MM MISC 180 each by Does not apply route 2 (two) times daily. 200 each 11  . metFORMIN (GLUCOPHAGE) 500 MG tablet TAKE 1 TABLET (500 MG TOTAL) BY MOUTH 2 (TWO) TIMES DAILY WITH A MEAL. 180 tablet 0  . quinapril (ACCUPRIL) 10 MG tablet Take 1 tablet (10 mg total) by mouth daily. 90 tablet 3   No current facility-administered medications on file prior to visit.    No Known Allergies  Family History  Problem Relation Age of Onset  . Colon polyps Sister   . Stroke Sister   . Lung cancer Sister   . Coronary  artery disease Brother   . Heart attack Brother   . Diabetes Brother        2 brothers   . Hypertension Brother   . Stroke Brother   . Heart attack Mother   . Bone cancer Sister   . Brain cancer Sister     BP 140/80   Pulse 91   Ht 5\' 7"  (1.702 m)   Wt 240 lb 12.8 oz (109.2 kg)   SpO2 95%   BMI 37.71 kg/m    Review of Systems denies blurry vision, chest pain, sob, n/v, urinary frequency, memory loss, and depression.  He has weight gain.       Objective:   Physical Exam VS: see vs page GEN: no distress HEAD: head: no deformity eyes: no periorbital swelling, no proptosis external nose and ears are normal NECK: supple, thyroid is not enlarged CHEST WALL: no deformity LUNGS: clear to auscultation CV: reg rate and rhythm, no murmur MUSCULOSKELETAL: muscle bulk and strength are grossly normal.  no obvious joint swelling.  gait is normal and steady EXTEMITIES: no deformity.  no ulcer on the feet.  feet are of normal color and temp.  no edema PULSES: dorsalis pedis intact bilat.  no carotid bruit NEURO:  cn 2-12 grossly intact.   readily moves all 4's.  sensation is intact to touch on the feet, but decreased on the LUE.  SKIN:  Normal texture and temperature.  No rash or suspicious lesion is visible.  Diaphoretic. NODES:  None palpable at the neck PSYCH: alert, well-oriented.  Does not appear anxious nor depressed.   Lab Results  Component Value Date   HGBA1C 8.4 (A) 05/20/2019   Lab Results  Component Value Date   CREATININE 0.89 03/06/2019   BUN 15 03/06/2019   NA 138 03/06/2019   K 4.5 03/06/2019   CL 103 03/06/2019   CO2 24 03/06/2019   I have reviewed outside records, and summarized: Pt was noted to have elevated A1c, and referred here.  SDOH was noted--specifically, the cost of meds.      Assessment & Plan:  Insulin-requiring type 2 DM, with PN: he needs increased rx.  He says he can afford the insulins he is on now.  Diaphoresis: pt checks glucose during  visit (250) Weight gain: we discussed adding SGLT or GLP, but he declines for now.   Patient Instructions  good diet and exercise significantly improve the control of your diabetes.  please let me know if you wish to be referred to a dietician.  high blood sugar is very risky to your  health.  you should see an eye doctor and dentist every year.  It is very important to get all recommended vaccinations.  Controlling your blood pressure and cholesterol drastically reduces the damage diabetes does to your body.  Those who smoke should quit.  Please discuss these with your doctor.  Please increase the Tresiba to 44 units daily, and Please continue the same Novolog. Please come back for a follow-up appointment in 2 months.

## 2019-05-25 ENCOUNTER — Telehealth: Payer: Self-pay

## 2019-05-25 NOTE — Telephone Encounter (Signed)
LVMTRC (1st attempt)   Per Christen Butter, DNP, APRN, FNP-BC:  Can you reach out to this patient and get him scheduled for a follow up appointment for his HTN near the end of May?

## 2019-05-26 ENCOUNTER — Ambulatory Visit: Payer: HMO | Admitting: Pharmacist

## 2019-05-26 NOTE — Telephone Encounter (Signed)
Patient scheduled.

## 2019-05-29 ENCOUNTER — Ambulatory Visit: Payer: HMO | Admitting: Medical-Surgical

## 2019-06-05 ENCOUNTER — Other Ambulatory Visit: Payer: Self-pay | Admitting: Medical

## 2019-06-24 ENCOUNTER — Other Ambulatory Visit: Payer: Self-pay | Admitting: Medical

## 2019-06-24 ENCOUNTER — Other Ambulatory Visit: Payer: Self-pay | Admitting: Medical-Surgical

## 2019-06-25 NOTE — Progress Notes (Signed)
Subjective:    CC: HTN, Hyperlipidemia, GERD follow up  HPI: Pleasant 64 year old male presenting today for follow-up on the following chronic conditions:  HTN-taking quinapril 10 mg daily.  Was previously prescribed hydrochlorothiazide but was unable to tolerate this medicine as it caused severe dizziness.  Took this for 4 days and then stopped.  Blood pressure still remaining elevated.  Patient reports that he did have fast food last night and ate two raise beef sandwiches.  Hyperlipidemia-taking Lipitor 80 mg daily, tolerating well without side effects.  GERD-not taking Protonix 40mg . Doing well without medication, reports no reflux symptoms for the past few months.  Diabetes-being followed by endocrinology.  As of last check hemoglobin A1c was 8.4% down from 9.9% at the beginning of the year.  Reports that he is seeing some blood sugars as low as the 80s or 90s.  Brought his glucometer for review.  Average blood sugars around 190-200.  CVA-currently taking Plavix 75 mg and aspirin 81 mg daily, tolerating well without side effects.  Reports that approximately 1-2 times per week he experiences a choking sensation where he feels like phlegm has accumulated and is cutting off his breath.  He resolves this by using his hand to manipulate his throat side to side.  Reports this has been happening since his last stroke and he was previously told that there are some things that just will not go back to normal after stroke.  I reviewed the past medical history, family history, social history, surgical history, and allergies today and no changes were needed.  Please see the problem list section below in epic for further details.  Past Medical History: Past Medical History:  Diagnosis Date  . ANXIETY 07/04/2007  . ASTHMATIC BRONCHITIS, ACUTE 12/19/2007  . DIABETES MELLITUS, TYPE II 09/28/2006  . ERECTILE DYSFUNCTION 09/28/2006  . GERD 09/28/2006  . Hematochezia 05/29/2010  . HYPERLIPIDEMIA 09/28/2006   . HYPERTENSION 09/28/2006  . Illiterate 09/11/2012  . LOW BACK PAIN 09/28/2006  . OBESITY 09/28/2006  . PLANTAR FASCIITIS, BILATERAL 07/04/2007  . Stroke [facility])    Past Surgical History: Past Surgical History:  Procedure Laterality Date  . APPENDECTOMY     Social History: Social History   Socioeconomic History  . Marital status: Married    Spouse name: Not on file  . Number of children: 1  . Years of education: Not on file  . Highest education level: Not on file  Occupational History  . Occupation: Retired [employer]  Tobacco Use  . Smoking status: Former Personnel officer  . Smokeless tobacco: Never Used  . Tobacco comment: quit at age 72  Substance and Sexual Activity  . Alcohol use: No  . Drug use: Not Currently    Types: Marijuana    Comment: occasional marijanua use anytime he can  . Sexual activity: Yes    Partners: Female  Other Topics Concern  . Not on file  Social History Narrative  . Not on file   Social Determinants of Health   Financial Resource Strain:   . Difficulty of Paying Living Expenses:   Food Insecurity: No Food Insecurity  . Worried About 31 in the Last Year: Never true  . Ran Out of Food in the Last Year: Never true  Transportation Needs: No Transportation Needs  . Lack of Transportation (Medical): No  . Lack of Transportation (Non-Medical): No  Physical Activity:   . Days of Exercise per Week:   . Minutes of Exercise per Session:   Stress:   .  Feeling of Stress :   Social Connections:   . Frequency of Communication with Friends and Family:   . Frequency of Social Gatherings with Friends and Family:   . Attends Religious Services:   . Active Member of Clubs or Organizations:   . Attends Archivist Meetings:   Marland Kitchen Marital Status:    Family History: Family History  Problem Relation Age of Onset  . Colon polyps Sister   . Stroke Sister   . Lung cancer Sister   . Coronary artery disease Brother   . Heart attack Brother    . Diabetes Brother        2 brothers   . Hypertension Brother   . Stroke Brother   . Heart attack Mother   . Bone cancer Sister   . Brain cancer Sister    Allergies: Allergies  Allergen Reactions  . Hydrochlorothiazide Other (See Comments)    Dizziness   Medications: See med rec.  Review of Systems: See HPI for pertinent positives and negatives.  Objective:    General: Well Developed, well nourished, and in no acute distress.  Neuro: Alert and oriented x3. HEENT: Normocephalic, atraumatic.  Skin: Warm and dry. Cardiac: Regular rate and rhythm, no murmurs rubs or gallops, no lower extremity edema.  Respiratory: Clear to auscultation bilaterally. Not using accessory muscles, speaking in full sentences.   Impression and Recommendations:    1. Essential hypertension, benign Increasing quinapril to 20 mg daily.  Checking CBC, CMP, and lipid panel today.  Advised to avoid high sodium and processed foods. - CBC - COMPLETE METABOLIC PANEL WITH GFR - Lipid panel  2. Gastroesophageal reflux disease, unspecified whether esophagitis present Okay to discontinue Protonix.  If reflux symptoms recur, may need to restart.  3. Hyperlipidemia, unspecified hyperlipidemia type Continue atorvastatin 80 mg daily.  Checking lipid panel. - Lipid panel  4. Uncontrolled type 2 diabetes mellitus with hyperglycemia ([place]) Followed by endocrinology.  Referral entered for diabetic eye exam with ophthalmology. - CBC - COMPLETE METABOLIC PANEL WITH GFR - Lipid panel - Ambulatory referral to Ophthalmology  5. Cerebrovascular accident (CVA) due to thrombosis of precerebral artery (HCC) Continue Plavix 75 mg daily and aspirin 81 mg daily.  Discussed expectations of post stroke residual.  If he notices an increase in frequency of the choking sensation, we may need to evaluate further.  Return in about 2 weeks (around 07/10/2019) for blood pressure  check. ___________________________________________ Clearnce Sorrel, DNP, APRN, FNP-BC Primary Care and [place]

## 2019-06-26 ENCOUNTER — Ambulatory Visit (INDEPENDENT_AMBULATORY_CARE_PROVIDER_SITE_OTHER): Payer: HMO | Admitting: Medical-Surgical

## 2019-06-26 ENCOUNTER — Encounter: Payer: Self-pay | Admitting: Medical-Surgical

## 2019-06-26 VITALS — BP 169/93 | HR 73 | Temp 97.6°F | Ht 67.0 in | Wt 249.6 lb

## 2019-06-26 DIAGNOSIS — I6300 Cerebral infarction due to thrombosis of unspecified precerebral artery: Secondary | ICD-10-CM

## 2019-06-26 DIAGNOSIS — I10 Essential (primary) hypertension: Secondary | ICD-10-CM

## 2019-06-26 DIAGNOSIS — K219 Gastro-esophageal reflux disease without esophagitis: Secondary | ICD-10-CM | POA: Diagnosis not present

## 2019-06-26 DIAGNOSIS — E785 Hyperlipidemia, unspecified: Secondary | ICD-10-CM | POA: Diagnosis not present

## 2019-06-26 DIAGNOSIS — E1165 Type 2 diabetes mellitus with hyperglycemia: Secondary | ICD-10-CM | POA: Diagnosis not present

## 2019-06-26 MED ORDER — QUINAPRIL HCL 20 MG PO TABS: 20.0000 mg | ORAL_TABLET | Freq: Every day | ORAL | 3 refills | Status: DC

## 2019-06-27 LAB — COMPLETE METABOLIC PANEL WITH GFR
AG Ratio: 1.7 (calc) (ref 1.0–2.5)
ALT: 22 U/L (ref 9–46)
AST: 21 U/L (ref 10–35)
Albumin: 4.3 g/dL (ref 3.6–5.1)
Alkaline phosphatase (APISO): 69 U/L (ref 35–144)
BUN: 23 mg/dL (ref 7–25)
CO2: 24 mmol/L (ref 20–32)
Calcium: 9.6 mg/dL (ref 8.6–10.3)
Chloride: 102 mmol/L (ref 98–110)
Creat: 0.76 mg/dL (ref 0.70–1.25)
GFR, Est African American: 110 mL/min/{1.73_m2} (ref >=60)
GFR, Est Non African American: 95 mL/min/{1.73_m2} (ref >=60)
Globulin: 2.5 g/dL (calc) (ref 1.9–3.7)
Glucose, Bld: 195 mg/dL — ABNORMAL HIGH (ref 65–139)
Potassium: 4.4 mmol/L (ref 3.5–5.3)
Sodium: 136 mmol/L (ref 135–146)
Total Bilirubin: 0.4 mg/dL (ref 0.2–1.2)
Total Protein: 6.8 g/dL (ref 6.1–8.1)

## 2019-06-27 LAB — CBC
HCT: 43.2 % (ref 38.5–50.0)
Hemoglobin: 13.8 g/dL (ref 13.2–17.1)
MCH: 26 pg — ABNORMAL LOW (ref 27.0–33.0)
MCHC: 31.9 g/dL — ABNORMAL LOW (ref 32.0–36.0)
MCV: 81.5 fL (ref 80.0–100.0)
MPV: 10.1 fL (ref 7.5–12.5)
Platelets: 245 10*3/uL (ref 140–400)
RBC: 5.3 10*6/uL (ref 4.20–5.80)
RDW: 14.5 % (ref 11.0–15.0)
WBC: 6.3 10*3/uL (ref 3.8–10.8)

## 2019-06-27 LAB — LIPID PANEL
Cholesterol: 145 mg/dL (ref <200)
HDL: 38 mg/dL — ABNORMAL LOW (ref >=40)
LDL Cholesterol (Calc): 87 mg/dL (calc)
Non-HDL Cholesterol (Calc): 107 mg/dL (calc) (ref <130)
Total CHOL/HDL Ratio: 3.8 (calc) (ref <5.0)
Triglycerides: 103 mg/dL (ref <150)

## 2019-07-10 ENCOUNTER — Ambulatory Visit (INDEPENDENT_AMBULATORY_CARE_PROVIDER_SITE_OTHER): Payer: HMO | Admitting: Medical-Surgical

## 2019-07-10 ENCOUNTER — Encounter: Payer: Self-pay | Admitting: Medical-Surgical

## 2019-07-10 VITALS — BP 113/72 | HR 77

## 2019-07-10 DIAGNOSIS — I10 Essential (primary) hypertension: Secondary | ICD-10-CM

## 2019-07-10 NOTE — Progress Notes (Signed)
Patient presents today as a nurse visit for a blood pressure check.  Patient states he  is taking his medication as prescribed without any side effects/adverse effects. Medication and allergy list reviewed with patient and the pharmacy has been verified.   HA: No Dizziness/lightheadedness: No Fever: No BA: No Weakness/Fatigue: No  Sinus pain/pressure: No  Runny nose: No  ST: No  ShOB: No  CP: No  Palps: No Abd pain: No Dysuria: No  N/V/C/D: No    Vital Signs: At 10:47 AM Blood Pressure: 169/97 Pulse: 78 SpO2: 96%  At 10:57 AM Blood Pressure: 121/75 Pulse: 76 SpO2: 96%  At 10:58 AM Blood Pressure: 113/72 Pulse: 77 SpO2: 95%  Information was shared with Christen Butter, DNP, APRN, FNP-BC who advised the pt to continue the current regimen as prescribed. Pt also instructed to keep his follow up appointment as scheduled. Patient verbalized understanding.

## 2019-07-13 ENCOUNTER — Ambulatory Visit: Payer: Self-pay

## 2019-07-16 ENCOUNTER — Other Ambulatory Visit: Payer: Self-pay | Admitting: Medical-Surgical

## 2019-07-17 DIAGNOSIS — E113293 Type 2 diabetes mellitus with mild nonproliferative diabetic retinopathy without macular edema, bilateral: Secondary | ICD-10-CM | POA: Diagnosis not present

## 2019-07-17 DIAGNOSIS — H2513 Age-related nuclear cataract, bilateral: Secondary | ICD-10-CM | POA: Diagnosis not present

## 2019-08-02 ENCOUNTER — Other Ambulatory Visit: Payer: Self-pay | Admitting: Medical-Surgical

## 2019-08-19 ENCOUNTER — Ambulatory Visit (INDEPENDENT_AMBULATORY_CARE_PROVIDER_SITE_OTHER): Payer: HMO | Admitting: Endocrinology

## 2019-08-19 ENCOUNTER — Encounter: Payer: Self-pay | Admitting: Endocrinology

## 2019-08-19 ENCOUNTER — Other Ambulatory Visit: Payer: Self-pay

## 2019-08-19 VITALS — BP 158/84 | HR 78 | Wt 256.0 lb

## 2019-08-19 DIAGNOSIS — E1165 Type 2 diabetes mellitus with hyperglycemia: Secondary | ICD-10-CM

## 2019-08-19 LAB — POCT GLYCOSYLATED HEMOGLOBIN (HGB A1C): Hemoglobin A1C: 8.8 % — AB (ref 4.0–5.6)

## 2019-08-19 MED ORDER — TRULICITY 0.75 MG/0.5ML ~~LOC~~ SOAJ: 0.7500 mg | SUBCUTANEOUS | 3 refills | Status: DC

## 2019-08-19 NOTE — Progress Notes (Signed)
Subjective:    Patient ID: John Roman, male    DOB: [birth year], 64 y.o.   MRN: [id number]  HPI Pt returns for f/u of diabetes mellitus: DM type: Insulin-requiring type 2 Dx'ed: 2010 Complications: CVA and PN Therapy: insulin since 2010 DKA: never Severe hypoglycemia: never Pancreatitis: never Pancreatic imaging: never SDOH: none Other: he takes multiple daily injections Interval history: I reviewed continuous glucose monitor data.  Glucose varies from 100-280.  It is in general lowest at midnight.  Pt says he never misses the insulins.   Past Medical History:  Diagnosis Date  . ANXIETY 07/04/2007  . ASTHMATIC BRONCHITIS, ACUTE 12/19/2007  . DIABETES MELLITUS, TYPE II 09/28/2006  . ERECTILE DYSFUNCTION 09/28/2006  . GERD 09/28/2006  . Hematochezia 05/29/2010  . HYPERLIPIDEMIA 09/28/2006  . HYPERTENSION 09/28/2006  . Illiterate 09/11/2012  . LOW BACK PAIN 09/28/2006  . OBESITY 09/28/2006  . PLANTAR FASCIITIS, BILATERAL 07/04/2007  . Stroke [facility])     Past Surgical History:  Procedure Laterality Date  . APPENDECTOMY      Social History   Socioeconomic History  . Marital status: Married    Spouse name: Not on file  . Number of children: 1  . Years of education: Not on file  . Highest education level: Not on file  Occupational History  . Occupation: Retired Personnel officer  Tobacco Use  . Smoking status: Former Games developer  . Smokeless tobacco: Never Used  . Tobacco comment: quit at age 69  Vaping Use  . Vaping Use: Never used  Substance and Sexual Activity  . Alcohol use: No  . Drug use: Not Currently    Types: Marijuana    Comment: occasional marijanua use anytime he can  . Sexual activity: Yes    Partners: Female  Other Topics Concern  . Not on file  Social History Narrative  . Not on file   Social Determinants of Health   Financial Resource Strain:   . Difficulty of Paying Living Expenses:   Food Insecurity: No Food Insecurity  . Worried About Programme researcher, broadcasting/film/video  in the Last Year: Never true  . Ran Out of Food in the Last Year: Never true  Transportation Needs: No Transportation Needs  . Lack of Transportation (Medical): No  . Lack of Transportation (Non-Medical): No  Physical Activity:   . Days of Exercise per Week:   . Minutes of Exercise per Session:   Stress:   . Feeling of Stress :   Social Connections:   . Frequency of Communication with Friends and Family:   . Frequency of Social Gatherings with Friends and Family:   . Attends Religious Services:   . Active Member of Clubs or Organizations:   . Attends Banker Meetings:   Marland Kitchen Marital Status:   Intimate Partner Violence:   . Fear of Current or Ex-Partner:   . Emotionally Abused:   Marland Kitchen Physically Abused:   . Sexually Abused:     Current Outpatient Medications on File Prior to Visit  Medication Sig Dispense Refill  . aspirin 81 MG EC tablet Take 1 tablet (81 mg total) by mouth daily. 90 tablet 3  . atorvastatin (LIPITOR) 80 MG tablet Take 1 tablet (80 mg total) by mouth daily at 6 PM. 90 tablet 3  . clopidogrel (PLAVIX) 75 MG tablet Take 1 tablet (75 mg total) by mouth daily. 90 tablet 3  . Continuous Blood Gluc Receiver (FREESTYLE LIBRE 14 DAY READER) DEVI See admin instructions. Use to test blood  sugar    . Continuous Blood Gluc Sensor (FREESTYLE LIBRE 14 DAY SENSOR) MISC Use every 14 days 6 each 3  . insulin degludec (TRESIBA FLEXTOUCH) 100 UNIT/ML FlexTouch Pen Inject 0.44 mLs (44 Units total) into the skin daily. 15 pen 5  . Insulin Pen Needle (PEN NEEDLES) 32G X 4 MM MISC 180 each by Does not apply route 2 (two) times daily. 200 each 11  . metFORMIN (GLUCOPHAGE) 500 MG tablet TAKE 1 TABLET (500 MG TOTAL) BY MOUTH 2 (TWO) TIMES DAILY WITH A MEAL. 180 tablet 0  . pantoprazole (PROTONIX) 40 MG tablet TAKE 1 TABLET BY MOUTH EVERY DAY 90 tablet 0  . quinapril (ACCUPRIL) 20 MG tablet Take 1 tablet (20 mg total) by mouth daily. 90 tablet 3  . [DISCONTINUED] metFORMIN  (GLUCOPHAGE) 500 MG tablet TAKE 1 TABLET (500 MG TOTAL) BY MOUTH 2 (TWO) TIMES DAILY WITH A MEAL. 180 tablet 0   No current facility-administered medications on file prior to visit.    Allergies  Allergen Reactions  . Hydrochlorothiazide Other (See Comments)    Dizziness    Family History  Problem Relation Age of Onset  . Colon polyps Sister   . Stroke Sister   . Lung cancer Sister   . Coronary artery disease Brother   . Heart attack Brother   . Diabetes Brother        2 brothers   . Hypertension Brother   . Stroke Brother   . Heart attack Mother   . Bone cancer Sister   . Brain cancer Sister     BP (!) 158/84 (BP Location: Left Arm, Patient Position: Sitting, Cuff Size: Normal)   Pulse 78   Wt 256 lb (116.1 kg)   SpO2 94%   BMI 40.10 kg/m   Review of Systems He denies hypoglycemia.      Objective:   Physical Exam VITAL SIGNS:  See vs page GENERAL: no distress Pulses: dorsalis pedis intact bilat.   MSK: no deformity of the feet CV: no leg edema Skin:  no ulcer on the feet.  normal color and temp on the feet. Neuro: sensation is intact to touch on the feet.     Lab Results  Component Value Date   HGBA1C 8.8 (A) 08/19/2019       Assessment & Plan:  Insulin-requiring type 2 DM, with PN: worse.  Pt agrees to try GLP.   Patient Instructions  Your blood pressure is high today.  Please see your primary care provider soon, to have it rechecked I have sent a prescription to your pharmacy, to add Trulicity Please continue the same other insulins for now.   Please come back for a follow-up appointment in 2 months.

## 2019-08-19 NOTE — Patient Instructions (Addendum)
Your blood pressure is high today.  Please see your primary care provider soon, to have it rechecked I have sent a prescription to your pharmacy, to add Trulicity Please continue the same other insulins for now.   Please come back for a follow-up appointment in 2 months.

## 2019-08-21 ENCOUNTER — Other Ambulatory Visit: Payer: Self-pay

## 2019-08-21 DIAGNOSIS — E1165 Type 2 diabetes mellitus with hyperglycemia: Secondary | ICD-10-CM

## 2019-08-21 MED ORDER — NOVOLOG FLEXPEN 100 UNIT/ML ~~LOC~~ SOPN: 20.0000 [IU] | PEN_INJECTOR | Freq: Two times a day (BID) | SUBCUTANEOUS | 0 refills | Status: DC

## 2019-08-25 ENCOUNTER — Other Ambulatory Visit: Payer: Self-pay | Admitting: Medical-Surgical

## 2019-08-26 ENCOUNTER — Ambulatory Visit: Payer: HMO | Admitting: Medical-Surgical

## 2019-09-09 ENCOUNTER — Encounter: Payer: Self-pay | Admitting: Medical-Surgical

## 2019-09-09 ENCOUNTER — Ambulatory Visit (INDEPENDENT_AMBULATORY_CARE_PROVIDER_SITE_OTHER): Payer: HMO

## 2019-09-09 ENCOUNTER — Other Ambulatory Visit: Payer: Self-pay

## 2019-09-09 ENCOUNTER — Ambulatory Visit (INDEPENDENT_AMBULATORY_CARE_PROVIDER_SITE_OTHER): Payer: HMO | Admitting: Medical-Surgical

## 2019-09-09 VITALS — BP 145/80 | HR 74 | Temp 98.0°F | Ht 67.0 in | Wt 259.3 lb

## 2019-09-09 DIAGNOSIS — R05 Cough: Secondary | ICD-10-CM

## 2019-09-09 DIAGNOSIS — R059 Cough, unspecified: Secondary | ICD-10-CM

## 2019-09-09 DIAGNOSIS — I10 Essential (primary) hypertension: Secondary | ICD-10-CM

## 2019-09-09 DIAGNOSIS — R062 Wheezing: Secondary | ICD-10-CM

## 2019-09-09 MED ORDER — ALBUTEROL SULFATE HFA 108 (90 BASE) MCG/ACT IN AERS: 2.0000 | INHALATION_SPRAY | Freq: Four times a day (QID) | RESPIRATORY_TRACT | 0 refills | Status: DC | PRN

## 2019-09-09 MED ORDER — AZITHROMYCIN 250 MG PO TABS
ORAL_TABLET | ORAL | 0 refills | Status: DC
Start: 2019-09-09 — End: 2019-12-21

## 2019-09-09 NOTE — Progress Notes (Signed)
Subjective:    CC: wheezing/coughing  HPI: Pleasant 64 year old male presenting today with reports of wheezing and coughing for the past few weeks.  Notes that he has had some sinus congestion and postnasal drip for at least the past 2 weeks.  Coughing and wheezing has been severe at times leaving him somewhat breathless for several minutes.  He has been coughing up small amounts of white sputum.  Has not been feeling sick overall. Reports he stopped taking his blood pressure medicine after his endocrinologist told him that the quinapril could cause a cough.  His cough has improved since stopping the medication but he continues to have wheezing spells.  Has been off of his blood pressure medicine for approximately 2 weeks.  Blood pressure is elevated in the today.  Patient is not checking at home regularly.  Denies fever, chills, chest pain, dyspnea on exertion, palpitations, headaches, dizziness, and lower extremity edema.  I reviewed the past medical history, family history, social history, surgical history, and allergies today and no changes were needed.  Please see the problem list section below in epic for further details.  Past Medical History: Past Medical History:  Diagnosis Date  . ANXIETY 07/04/2007  . ASTHMATIC BRONCHITIS, ACUTE 12/19/2007  . DIABETES MELLITUS, TYPE II 09/28/2006  . ERECTILE DYSFUNCTION 09/28/2006  . GERD 09/28/2006  . Hematochezia 05/29/2010  . HYPERLIPIDEMIA 09/28/2006  . HYPERTENSION 09/28/2006  . Illiterate 09/11/2012  . LOW BACK PAIN 09/28/2006  . OBESITY 09/28/2006  . PLANTAR FASCIITIS, BILATERAL 07/04/2007  . Stroke [facility])    Past Surgical History: Past Surgical History:  Procedure Laterality Date  . APPENDECTOMY     Social History: Social History   Socioeconomic History  . Marital status: Married    Spouse name: Not on file  . Number of children: 1  . Years of education: Not on file  . Highest education level: Not on file  Occupational History  .  Occupation: Retired Personnel officer  Tobacco Use  . Smoking status: Former Games developer  . Smokeless tobacco: Never Used  . Tobacco comment: quit at age 89  Vaping Use  . Vaping Use: Never used  Substance and Sexual Activity  . Alcohol use: No  . Drug use: Not Currently    Types: Marijuana    Comment: occasional marijanua use anytime he can  . Sexual activity: Yes    Partners: Female  Other Topics Concern  . Not on file  Social History Narrative  . Not on file   Social Determinants of Health   Financial Resource Strain:   . Difficulty of Paying Living Expenses:   Food Insecurity: No Food Insecurity  . Worried About Programme researcher, broadcasting/film/video in the Last Year: Never true  . Ran Out of Food in the Last Year: Never true  Transportation Needs: No Transportation Needs  . Lack of Transportation (Medical): No  . Lack of Transportation (Non-Medical): No  Physical Activity:   . Days of Exercise per Week:   . Minutes of Exercise per Session:   Stress:   . Feeling of Stress :   Social Connections:   . Frequency of Communication with Friends and Family:   . Frequency of Social Gatherings with Friends and Family:   . Attends Religious Services:   . Active Member of Clubs or Organizations:   . Attends Banker Meetings:   Marland Kitchen Marital Status:    Family History: Family History  Problem Relation Age of Onset  . Colon polyps Sister   .  Stroke Sister   . Lung cancer Sister   . Coronary artery disease Brother   . Heart attack Brother   . Diabetes Brother        2 brothers   . Hypertension Brother   . Stroke Brother   . Heart attack Mother   . Bone cancer Sister   . Brain cancer Sister    Allergies: Allergies  Allergen Reactions  . Hydrochlorothiazide Other (See Comments)    Dizziness   Medications: See med rec.  Review of Systems: See HPI for pertinent positives and negatives.   Objective:    General: Well Developed, well nourished, and in no acute distress.  Neuro: Alert  and oriented x3.  HEENT: Normocephalic, atraumatic.  Skin: Warm and dry. Cardiac: Regular rate and rhythm, no murmurs rubs or gallops, no lower extremity edema.  Respiratory: Scattered rhonchi to bilateral upper lobes and left lower lobe.  Expiratory wheeze to left lower lobe. Not using accessory muscles, speaking in full sentences with mildly increased work of breathing.  Strong wheezy cough.  Impression and Recommendations:    1. Cough/wheezing Remote exposure to Covid about 6 weeks ago but no symptoms developed until about 3 weeks ago.  Low suspicion of Covid infection.  We will go ahead and get a chest x-ray today to rule out pneumonia and other lung etiology.  With presence of sinus symptoms suspect sinobronchitis.  Treating with azithromycin.  Albuterol inhaler provided to use as needed for wheezing.  Detailed instructions provided to patient as he is unable to read. - DG Chest 2 View; Future  2.  Essential hypertension We will closely monitor his blood pressure.  After 2 weeks off medication, his blood pressure of 145/80 is not too far off goal, especially in the setting of acute illness. Treating his current illness and then will recheck in 4 weeks. May benefit from starting a low dose ARB for BP control and kidney protection.  Return in about 4 weeks (around 10/07/2019) for HTN follow up.  ___________________________________________ Thayer Ohm, DNP, APRN, FNP-BC Primary Care and Sports Medicine [facility] [place]

## 2019-09-23 ENCOUNTER — Other Ambulatory Visit: Payer: Self-pay

## 2019-09-23 NOTE — Patient Outreach (Signed)
[facility]) Care Management Chronic Special Needs Program  09/23/2019  Name: John Roman DOB: 1955-12-14  MRN: [id number]  [name] is enrolled in a chronic special needs plan for Diabetes. RNCM reviewed and updated care plan.  RNCM called to follow up. Two patient identifiers confirmed.  Subjective: client reports he has been seeing his primary care provider. Last office visit completed 09/09/2019. Per client seen for respiratory issues.  A1C 8.8 on 08/19/19, decreased from 9.9 on 03/06/19. Client reports he is seeing an Endocrinologist. And reports he is taking a long acting insulin and a fast acting insulin.  Per chart-history of hypertension. Last documented blood pressure 145/80 on 09/09/19. Client reports he does not have a blood pressure monitor. And becomes aggravated with RNCM as RNCM discusses blood pressure and the importance of self monitoring. RNCM attempted to review medications, but client is expressly aggravated surrounding the conversation of blood pressure, and RNCM unable to complete review of mediation list. Client has a follow up appointment with primary care next week.  Goals Addressed            This Visit's Progress    COMPLETED: Client will not report change from baseline and no repeated symptoms of stroke with in the next 6 months       Reports no change from baseline.  Stroke Symptoms Spot a stroke F.A.S.T. FACE DROOPING Does one side of the face droop or is it numb? Ask the person to smile. ARM WEAKNESS Is one arm weak or numb? Ask the person to raise both arms. Does one arm drift downward? SPEECH DIFFICULTY Is speech slurred, are they unable to speak, or are they hard to understand? Ask the person to repeat a simple sentence, like "the sky is blue." Is the sentence repeated correctly? TIME TO CALL 9-1-1 If the person shows any of these symptoms, even if the symptoms go away, call 9-1-1 and get them to the hospital immediately.      Client will verbalize knowledge of diabetes self-management as evidenced by Hgb A1C <7 or as defined by provider.   On track    A1C 8.8 on 08/19/19  Diabetes self management actions:  Continue checking your blood sugar per provider recommendations  Continue to eat Healthy: low carbohydrate and low salt meals, watch portion sizes and avoid sugar sweetened drinks.  Visit provider every 3-6 months as directed  Hbg A1C level every 3-6 months. Ask your doctor, "what is my Target A1C goal?" Ask your doctor, "what is my Target blood sugar range?"     Client will verbalize knowledge of self management of Hypertension as evidences by BP reading of 140/90 or less; or as defined by provider   On track    Blood Pressure 145/80 on 09/09/2019 Discussed blood pressure self management. Continue to take medications as recommended by your provider. RN Care case manager will send client blood pressure cuff. Follow up with your doctor as scheduled. Ask your doctor what is my target blood pressure range. Monitor your blood pressure and take results to your doctor's appointment.  Monitor the amount of salt you are eating. Continue to exercise as tolerated and remain active.     Maintain timely refills of diabetic medication as prescribed within the year .   On track    RNCM reviewed purpose and prescribed dosage of inhaler with client. Please call your doctor or your nurse care management coordinator [phone]), if you are having any difficulties obtaining medications.  Obtain Annual Eye (retinal)  Exam    On track    Data unavailable. It is important for you to see your doctor for recommended yearly exam. Please call to schedule exam if you have not     COMPLETED: Obtain Annual Foot Exam       Done 08/19/19     Obtain annual screen for micro albuminuria (urine) , nephropathy (kidney problems)   On track    Last noted documented was done on 07/21/2018. This is a test that looks at how  your kidneys are working.     COMPLETED: Obtain Hemoglobin A1C at least 2 times per year        A1C 8.8 on 08/19/19 down from 9.9 on 03/06/19.     COMPLETED: Visit Primary Care Provider or Endocrinologist at least 2 times per year        primary care provider visit completed on  04/03/2019 and 09/09/19 Diabetes  doctor visit completed on  03/31/2019 and 08/19/2019 and 05/20/19.      Plan: send updated care plan to client, send updated are plan to primary care provider. Provide blood pressure monitor. Outreach scheduled within the next 6 months.   Kathyrn Sheriff, RN, MSN, [facility] Chronic Care Management Coordinator [facility] [phone]

## 2019-09-28 ENCOUNTER — Other Ambulatory Visit: Payer: Self-pay

## 2019-09-28 ENCOUNTER — Encounter: Payer: Self-pay | Admitting: Medical-Surgical

## 2019-09-28 ENCOUNTER — Ambulatory Visit (INDEPENDENT_AMBULATORY_CARE_PROVIDER_SITE_OTHER): Payer: HMO | Admitting: Medical-Surgical

## 2019-09-28 VITALS — BP 158/80 | HR 73 | Temp 97.7°F | Ht 67.0 in | Wt 268.1 lb

## 2019-09-28 DIAGNOSIS — R062 Wheezing: Secondary | ICD-10-CM

## 2019-09-28 DIAGNOSIS — I10 Essential (primary) hypertension: Secondary | ICD-10-CM

## 2019-09-28 MED ORDER — PREDNISONE 50 MG PO TABS: 50.0000 mg | ORAL_TABLET | Freq: Every day | ORAL | 0 refills | Status: DC

## 2019-09-28 MED ORDER — LOSARTAN POTASSIUM 25 MG PO TABS: 25.0000 mg | ORAL_TABLET | Freq: Every day | ORAL | 1 refills | Status: DC

## 2019-09-28 NOTE — Progress Notes (Signed)
Subjective:    CC: HTN follow up  HPI: Pleasant 64 year old male presenting today for HTN follow up. He was seen 8/4 for upper respiratory issues and admitted that he had stopped his Quinapril about 2 weeks before. His blood pressure was mildly elevated at that appointment at 145/90. He returns today with continued elevation of his blood pressure at 158/80. Reports that his cough and associated choking sensation has resolved since stopping the medication. Has not been checking blood pressures at home until he received a blood pressure monitor in the mail from the case manager at [facility]. Denies chest pain, palpitations, lower extremity edema, headaches, dizziness, and vision changes. Some dyspnea on exertion.   Continues to have wheezing and dyspnea on exertion. Completed Azithromycin as instructed, tolerated well. Has been using his Albuterol inhaler every 6 hours while awake. Notes it helps some but he still has "whistling" and doesn't know why. Denies fever, chills, productive cough, and orthopnea.  I reviewed the past medical history, family history, social history, surgical history, and allergies today and no changes were needed.  Please see the problem list section below in epic for further details.  Past Medical History: Past Medical History:  Diagnosis Date  . ANXIETY 07/04/2007  . ASTHMATIC BRONCHITIS, ACUTE 12/19/2007  . DIABETES MELLITUS, TYPE II 09/28/2006  . ERECTILE DYSFUNCTION 09/28/2006  . GERD 09/28/2006  . Hematochezia 05/29/2010  . HYPERLIPIDEMIA 09/28/2006  . HYPERTENSION 09/28/2006  . Illiterate 09/11/2012  . LOW BACK PAIN 09/28/2006  . OBESITY 09/28/2006  . PLANTAR FASCIITIS, BILATERAL 07/04/2007  . Stroke [facility])    Past Surgical History: Past Surgical History:  Procedure Laterality Date  . APPENDECTOMY     Social History: Social History   Socioeconomic History  . Marital status: Married    Spouse name: Not on file  . Number of children: 1  . Years of education: Not on  file  . Highest education level: Not on file  Occupational History  . Occupation: Retired Personnel officer  Tobacco Use  . Smoking status: Former Games developer  . Smokeless tobacco: Never Used  . Tobacco comment: quit at age 56  Vaping Use  . Vaping Use: Never used  Substance and Sexual Activity  . Alcohol use: No  . Drug use: Not Currently    Types: Marijuana    Comment: occasional marijanua use anytime he can  . Sexual activity: Yes    Partners: Female  Other Topics Concern  . Not on file  Social History Narrative  . Not on file   Social Determinants of Health   Financial Resource Strain:   . Difficulty of Paying Living Expenses: Not on file  Food Insecurity: No Food Insecurity  . Worried About Programme researcher, broadcasting/film/video in the Last Year: Never true  . Ran Out of Food in the Last Year: Never true  Transportation Needs: No Transportation Needs  . Lack of Transportation (Medical): No  . Lack of Transportation (Non-Medical): No  Physical Activity:   . Days of Exercise per Week: Not on file  . Minutes of Exercise per Session: Not on file  Stress:   . Feeling of Stress : Not on file  Social Connections:   . Frequency of Communication with Friends and Family: Not on file  . Frequency of Social Gatherings with Friends and Family: Not on file  . Attends Religious Services: Not on file  . Active Member of Clubs or Organizations: Not on file  . Attends Banker Meetings: Not on file  .  Marital Status: Not on file   Family History: Family History  Problem Relation Age of Onset  . Colon polyps Sister   . Stroke Sister   . Lung cancer Sister   . Coronary artery disease Brother   . Heart attack Brother   . Diabetes Brother        2 brothers   . Hypertension Brother   . Stroke Brother   . Heart attack Mother   . Bone cancer Sister   . Brain cancer Sister    Allergies: Allergies  Allergen Reactions  . Hydrochlorothiazide Other (See Comments)    Dizziness   Medications:  See med rec.  Review of Systems: See HPI for pertinent positives and negatives.   Objective:    General: Well Developed, well nourished, and in no acute distress.  Neuro: Alert and oriented x3.  HEENT: Normocephalic, atraumatic.  Skin: Warm and dry. Cardiac: Regular rate and rhythm, no murmurs rubs or gallops, no lower extremity edema.  Respiratory: Clear to auscultation bilaterally. Audible wheezing stemming from tracheal area. Not using accessory muscles, speaking in full sentences. Increased work of breathing with activity. Conversational dyspnea.  Impression and Recommendations:    1. Essential hypertension, benign Starting Losartan 25mg  daily. Discussed low sodium diet and monitoring BP at home. He received a BP from the [facility] case manager and is able to use it without difficulty. Recommend BP goal of 130/80.  2. Wheezing/DOE Chest x-ray 8/4 negative for active cardiopulmonary disease. History of asthmatic bronchitis. Last echo in 2019 normal. No lower extremity edema. Audible wheezing appears to be upper respiratory/tracheal in nature. Continue albuterol q 6 hours as needed. Prednisone 50mg  daily x 5 days. Advised that this will cause elevations in blood sugars and to treat/monitor appropriately.   Return in about 2 weeks (around 10/12/2019) for nurse visit for BP check. ___________________________________________ , DNP, APRN, FNP-BC Primary Care and Sports Medicine [facility]

## 2019-09-29 ENCOUNTER — Other Ambulatory Visit: Payer: Self-pay | Admitting: Medical-Surgical

## 2019-09-30 NOTE — Telephone Encounter (Signed)
This encounter was created in error - please disregard.

## 2019-10-01 ENCOUNTER — Other Ambulatory Visit: Payer: Self-pay | Admitting: Medical-Surgical

## 2019-10-07 ENCOUNTER — Ambulatory Visit: Payer: HMO | Admitting: Medical-Surgical

## 2019-10-09 ENCOUNTER — Other Ambulatory Visit: Payer: Self-pay

## 2019-10-09 NOTE — Patient Outreach (Signed)
  [facility]) Care Management Chronic Special Needs Program    10/09/2019  Late entry for 10/07/2019  Name: John Roman, DOB: [birth year]  MRN: [id number]   [name] is enrolled in a chronic special needs plan.   Care Coordination: RNCM received call from [facility] pharmacist, Marcella Dubs, that he received referral for Trulicity assistance and was unable to communicate with client. Per review of record direct referral received from primary care provider to [facility] pharmacy team. [facility] pharmacist to communicate with client's primary care provider to update.   Plan: continue to follow as previously scheduled.  Kathyrn Sheriff, RN, MSN, [facility] Chronic Care Management Coordinator [facility] [phone]

## 2019-10-10 ENCOUNTER — Other Ambulatory Visit: Payer: Self-pay | Admitting: Medical-Surgical

## 2019-10-13 ENCOUNTER — Ambulatory Visit: Payer: HMO

## 2019-10-19 ENCOUNTER — Ambulatory Visit (INDEPENDENT_AMBULATORY_CARE_PROVIDER_SITE_OTHER): Payer: HMO | Admitting: Medical-Surgical

## 2019-10-19 ENCOUNTER — Other Ambulatory Visit: Payer: Self-pay

## 2019-10-19 VITALS — BP 125/75 | HR 80

## 2019-10-19 DIAGNOSIS — I10 Essential (primary) hypertension: Secondary | ICD-10-CM

## 2019-10-19 NOTE — Progress Notes (Signed)
Patient presented to nurse visit for blood pressure check.  Blood pressure elevated at first check but returned to normal during second check.  Continue losartan 25 mg daily as prescribed.  Return for follow-up in 3 months.  Thayer Ohm, DNP, APRN, FNP-BC [facility] and Sports Medicine

## 2019-10-23 ENCOUNTER — Other Ambulatory Visit: Payer: Self-pay | Admitting: Medical-Surgical

## 2019-11-11 DIAGNOSIS — S61213A Laceration without foreign body of left middle finger without damage to nail, initial encounter: Secondary | ICD-10-CM | POA: Diagnosis not present

## 2019-11-12 DIAGNOSIS — S61219S Laceration without foreign body of unspecified finger without damage to nail, sequela: Secondary | ICD-10-CM | POA: Diagnosis not present

## 2019-11-23 ENCOUNTER — Other Ambulatory Visit: Payer: Self-pay

## 2019-11-23 MED ORDER — TRESIBA FLEXTOUCH 100 UNIT/ML ~~LOC~~ SOPN: 44.0000 [IU] | PEN_INJECTOR | Freq: Every day | SUBCUTANEOUS | Status: DC

## 2019-11-26 DIAGNOSIS — S61219S Laceration without foreign body of unspecified finger without damage to nail, sequela: Secondary | ICD-10-CM | POA: Diagnosis not present

## 2019-11-30 ENCOUNTER — Other Ambulatory Visit: Payer: Self-pay | Admitting: Medical-Surgical

## 2019-12-06 ENCOUNTER — Other Ambulatory Visit: Payer: Self-pay | Admitting: Medical-Surgical

## 2019-12-08 ENCOUNTER — Other Ambulatory Visit: Payer: Self-pay

## 2019-12-08 NOTE — Patient Outreach (Signed)
  [facility]) Care Management Chronic Special Needs Program    12/08/2019  Name: Cully Luckow, DOB: April 06, 1955  MRN: [id number]   [facility] Care Management team has assumed care and services for this client. Case Closed by [facility] Care Management.  Kathyrn Sheriff, RN, MSN, [facility] Chronic Care Management Coordinator [facility] [phone]

## 2019-12-21 ENCOUNTER — Ambulatory Visit (INDEPENDENT_AMBULATORY_CARE_PROVIDER_SITE_OTHER): Payer: HMO | Admitting: Medical-Surgical

## 2019-12-21 ENCOUNTER — Other Ambulatory Visit: Payer: Self-pay

## 2019-12-21 ENCOUNTER — Encounter: Payer: Self-pay | Admitting: Medical-Surgical

## 2019-12-21 VITALS — BP 170/76 | HR 76 | Temp 97.7°F | Ht 67.0 in | Wt 277.5 lb

## 2019-12-21 DIAGNOSIS — M170 Bilateral primary osteoarthritis of knee: Secondary | ICD-10-CM | POA: Insufficient documentation

## 2019-12-21 DIAGNOSIS — E1165 Type 2 diabetes mellitus with hyperglycemia: Secondary | ICD-10-CM

## 2019-12-21 DIAGNOSIS — I10 Essential (primary) hypertension: Secondary | ICD-10-CM | POA: Diagnosis not present

## 2019-12-21 DIAGNOSIS — R062 Wheezing: Secondary | ICD-10-CM | POA: Diagnosis not present

## 2019-12-21 DIAGNOSIS — R1310 Dysphagia, unspecified: Secondary | ICD-10-CM

## 2019-12-21 LAB — POCT GLYCOSYLATED HEMOGLOBIN (HGB A1C): Hemoglobin A1C: 8.4 % — AB (ref 4.0–5.6)

## 2019-12-21 NOTE — Progress Notes (Signed)
Subjective:    CC: leg stiffness  HPI: Pleasant 64 year old male presenting with complaints of leg stiffness and for follow up on some other concerns.   Legs-notes that his bilateral lower extremities have been extremely stiff for the past several months.  Denies pain in his low back, hips, and knees.  Reports the discomfort feels like a stiffness in the muscles of his legs.  Notes it is worse after he has sat still for a while such as with long distance driving or sleeping at night.  It is hard to get up and moving but once he is on his feet for a bit, the stiffness improves and he is able to walk normally.  Admits that he has not been active like he was previously.  Has gained 37 pounds in the last 7 months due to inactivity and overeating.  Denies numbness, tingling, muscle weakness, or recent injury  Choking/wheezing-continues to experience audible wheezing in the upper airway.  Notes that he has been having trouble choking lately.  The other day he was trying to drink water and ended up choking on that as well.  This issue has been occurring since his last stroke but seems to be getting a bit worse lately.  Denies fever, chills, shortness of breath, chest pain, or productive cough.  HTN-taking losartan 25 mg as prescribed.  Notes that he did have his dose today.  Admits to eating 2 bags of potato chips over the last couple of days.  Denies chest pain, shortness of breath, palpitations, lower extremity edema, headaches, dizziness, and vision changes.  DM-was seen by endocrinology back in July who advised him to follow-up in 2 months.  He reports no one called him to get him scheduled for that appointment.  He has been monitoring his blood sugars at home and notes one hypoglycemic episode with a morning glucose of 63.  Admits that he does not know what is good for him to eat and what is not.  Does not remember ever going to a diabetic nutrition class but is interested in learning more about what he  should and should not eat.  I reviewed the past medical history, family history, social history, surgical history, and allergies today and no changes were needed.  Please see the problem list section below in epic for further details.  Past Medical History: Past Medical History:  Diagnosis Date  . ANXIETY 07/04/2007  . ASTHMATIC BRONCHITIS, ACUTE 12/19/2007  . DIABETES MELLITUS, TYPE II 09/28/2006  . ERECTILE DYSFUNCTION 09/28/2006  . GERD 09/28/2006  . Hematochezia 05/29/2010  . HYPERLIPIDEMIA 09/28/2006  . HYPERTENSION 09/28/2006  . Illiterate 09/11/2012  . LOW BACK PAIN 09/28/2006  . OBESITY 09/28/2006  . PLANTAR FASCIITIS, BILATERAL 07/04/2007  . Stroke [facility])    Past Surgical History: Past Surgical History:  Procedure Laterality Date  . APPENDECTOMY     Social History: Social History   Socioeconomic History  . Marital status: Married    Spouse name: Not on file  . Number of children: 1  . Years of education: Not on file  . Highest education level: Not on file  Occupational History  . Occupation: Retired Personnel officer  Tobacco Use  . Smoking status: Former Games developer  . Smokeless tobacco: Never Used  . Tobacco comment: quit at age 50  Vaping Use  . Vaping Use: Never used  Substance and Sexual Activity  . Alcohol use: No  . Drug use: Not Currently    Types: Marijuana    Comment:  occasional marijanua use anytime he can  . Sexual activity: Yes    Partners: Female  Other Topics Concern  . Not on file  Social History Narrative  . Not on file   Social Determinants of Health   Financial Resource Strain:   . Difficulty of Paying Living Expenses: Not on file  Food Insecurity: No Food Insecurity  . Worried About Programme researcher, broadcasting/film/video in the Last Year: Never true  . Ran Out of Food in the Last Year: Never true  Transportation Needs: No Transportation Needs  . Lack of Transportation (Medical): No  . Lack of Transportation (Non-Medical): No  Physical Activity:   . Days of Exercise  per Week: Not on file  . Minutes of Exercise per Session: Not on file  Stress:   . Feeling of Stress : Not on file  Social Connections:   . Frequency of Communication with Friends and Family: Not on file  . Frequency of Social Gatherings with Friends and Family: Not on file  . Attends Religious Services: Not on file  . Active Member of Clubs or Organizations: Not on file  . Attends Banker Meetings: Not on file  . Marital Status: Not on file   Family History: Family History  Problem Relation Age of Onset  . Colon polyps Sister   . Stroke Sister   . Lung cancer Sister   . Coronary artery disease Brother   . Heart attack Brother   . Diabetes Brother        2 brothers   . Hypertension Brother   . Stroke Brother   . Heart attack Mother   . Bone cancer Sister   . Brain cancer Sister    Allergies: Allergies  Allergen Reactions  . Hydrochlorothiazide Other (See Comments)    Dizziness   Medications: See med rec.  Review of Systems: See HPI for pertinent positives and negatives.   Objective:    General: Well Developed, well nourished, and in no acute distress.  Neuro: Alert and oriented x3.  HEENT: Normocephalic, atraumatic.  Skin: Warm and dry. Cardiac: Regular rate and rhythm, no murmurs rubs or gallops, no lower extremity edema.  Respiratory: Clear to auscultation bilaterally. Not using accessory muscles, speaking in full sentences.  Impression and Recommendations:    1. Uncontrolled type 2 diabetes mellitus with hyperglycemia (HCC) POCT hemoglobin A1c 8.4% today.  Checking CBC and CMP.  Referring to diabetic education.  Continue Trulicity, Tresiba, and Metformin as prescribed. - POCT glycosylated hemoglobin (Hb A1C) - CBC - COMPLETE METABOLIC PANEL WITH GFR - Ambulatory referral to diabetic education  2. Essential hypertension, benign Continue losartan 25 mg daily.  Discussed sodium restriction.  Monitor blood pressure at home with goal of top #130 or  less.  Return in 2 weeks for a nurse visit to evaluate blood pressure.  If still elevated, we will need to adjust his medications.  3. Osteoarthritis of lower legs, bilateral No neurological symptoms.  Suspect osteoarthritis exacerbated by his recent inactivity.  Discussed increasing activity and aiming for weight loss to help with his symptoms.  Kidney function looks okay so may benefit from intermittent use of ibuprofen as well as Tylenol.  4. Dysphagia, unspecified type/wheezing Referring to GI for further evaluation.  Referral for home health placed for speech-language pathology to do an in-home swallowing evaluation and work with him on measures to increase the safety of his swallowing and prevent choking. - Ambulatory referral to Home Health - Ambulatory referral to Gastroenterology  Return in about 2 weeks (around 01/04/2020) for BP check nurse visit. ___________________________________________ Thayer Ohm, DNP, APRN, FNP-BC Primary Care and Sports Medicine [facility]

## 2019-12-22 ENCOUNTER — Other Ambulatory Visit: Payer: Self-pay | Admitting: Medical-Surgical

## 2019-12-22 MED ORDER — TRULICITY 1.5 MG/0.5ML ~~LOC~~ SOAJ: 1.5000 mg | SUBCUTANEOUS | 3 refills | Status: DC

## 2019-12-22 MED ORDER — FERROUS SULFATE 325 (65 FE) MG PO TBEC: 325.0000 mg | DELAYED_RELEASE_TABLET | Freq: Three times a day (TID) | ORAL | 3 refills | Status: AC

## 2019-12-25 ENCOUNTER — Encounter: Payer: Self-pay | Admitting: Registered"

## 2019-12-25 ENCOUNTER — Encounter: Payer: HMO | Attending: Medical-Surgical | Admitting: Registered"

## 2019-12-25 ENCOUNTER — Other Ambulatory Visit: Payer: Self-pay

## 2019-12-25 DIAGNOSIS — E1165 Type 2 diabetes mellitus with hyperglycemia: Secondary | ICD-10-CM | POA: Insufficient documentation

## 2019-12-25 LAB — COMPLETE METABOLIC PANEL WITH GFR
AG Ratio: 1.6 (calc) (ref 1.0–2.5)
ALT: 27 U/L (ref 9–46)
AST: 23 U/L (ref 10–35)
Albumin: 4.4 g/dL (ref 3.6–5.1)
Alkaline phosphatase (APISO): 71 U/L (ref 35–144)
BUN: 18 mg/dL (ref 7–25)
CO2: 26 mmol/L (ref 20–32)
Calcium: 9.5 mg/dL (ref 8.6–10.3)
Chloride: 98 mmol/L (ref 98–110)
Creat: 0.78 mg/dL (ref 0.70–1.25)
GFR, Est African American: 109 mL/min/{1.73_m2} (ref >=60)
GFR, Est Non African American: 94 mL/min/{1.73_m2} (ref >=60)
Globulin: 2.7 g/dL (calc) (ref 1.9–3.7)
Glucose, Bld: 297 mg/dL — ABNORMAL HIGH (ref 65–99)
Potassium: 4.5 mmol/L (ref 3.5–5.3)
Sodium: 134 mmol/L — ABNORMAL LOW (ref 135–146)
Total Bilirubin: 0.4 mg/dL (ref 0.2–1.2)
Total Protein: 7.1 g/dL (ref 6.1–8.1)

## 2019-12-25 LAB — CBC
HCT: 36.3 % — ABNORMAL LOW (ref 38.5–50.0)
Hemoglobin: 11.1 g/dL — ABNORMAL LOW (ref 13.2–17.1)
MCH: 22.7 pg — ABNORMAL LOW (ref 27.0–33.0)
MCHC: 30.6 g/dL — ABNORMAL LOW (ref 32.0–36.0)
MCV: 74.1 fL — ABNORMAL LOW (ref 80.0–100.0)
MPV: 10.3 fL (ref 7.5–12.5)
Platelets: 268 10*3/uL (ref 140–400)
RBC: 4.9 10*6/uL (ref 4.20–5.80)
RDW: 14.6 % (ref 11.0–15.0)
WBC: 6 10*3/uL (ref 3.8–10.8)

## 2019-12-25 LAB — PATHOLOGIST SMEAR REVIEW

## 2019-12-25 NOTE — Patient Instructions (Addendum)
Tips for injecting insulin: Clean site before injecting insulin After opening a pen you can leave it out at room temperature for at least 14 days. This will prevent it from stinging when you inject. Rotate sites. Consider using more than just your left arm for injecting. After injecting leave needle in skin for the count of 5 instead of pulling it out immediately. Contact your doctor about the units of insulin you should be using. Use Novolog as directed, with or before meals instead of after meals. Take insulin with all your meals, even if just a sandwich  Diet: Cheese does not raise your blood sugar much, but eat in moderation because it has a type of fat that is not good to eat in large amounts. Aim to eat balanced meals with protein, carbohydrate and vegetables. All vegetables are good, the starchy vegetables you'll want to eat smaller portions.

## 2019-12-25 NOTE — Progress Notes (Signed)
Diabetes Self-Management Education  Visit Type: First/Initial  Appt. Start Time: 1000 Appt. End Time: 1110  12/25/2019  [name], identified by name and date of birth, is a 64 y.o. male with a diagnosis of Diabetes: Type 2.   ASSESSMENT  There were no vitals taken for this visit. There is no height or weight on file to calculate BMI.   Medication: metformin 500 BID, pt states taking as directed reports no GI effects Tresiba 44 u daily; pt taking differently 70 units daily between 9-11 pm Novolog 20-25 units 3x/daily before meals; pt taking differently 30-40 units ~60 min after some meals if CBG is high. Pt states he does not take insulin if only eating something like a sandwich.  Trulicity: Pt is irritated that Trulicity remains on his medication list because he cannot afford it.  Pt states he keeps opened insulin pens in the refrigerator. Pt states he limits injection sites to left arm because he doesn't feel much on left side. Pt reports injections stings on the right side. Pt pulls needle out of skin immediately after injection.  SMBG: Patient uses [employer]. Per patients CGM reader (phone) he has extreme daily excursions. This is likely due to not using insulin for all meals and then using Novolog 1-2 hours post meals to over correct.  Hypoglycemia: Pt reports hypoglycemic events and states he is treating with 1/2 cup soda.  History: Pt states he has been using insulin ~2 yrs. Pt reports when he started seeing Dr. Jenny Reichmann he was told if he lost weight he could get off some of his diabetes medications. Pt states he lost 70 lbs and felt great. Pt reports he has gain weight back over last 5-6 years and states after his 2 sisters passed away he didn't care anymore. Pt states now he is trying to figure out how to lose weight. Pt reports that sometimes he skips lunch to try to lose weight.  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   Diabetes Self-Management Education - 12/25/19 1022      Visit Information   Visit Type First/Initial      Initial Visit   Diabetes Type Type 2    Are you currently following a meal plan? No    Are you taking your medications as prescribed? No   can't afford Trulicity, Met 614 mg BID, Insulin     Psychosocial Assessment   Self-care barriers Low literacy    How often do you need to have someone help you when you read instructions, pamphlets, or other written materials from your doctor or pharmacy? 5 - Always      Complications   Last HgB A1C per patient/outside source 8.4 %    How often do you check your blood sugar? > 4 times/day   CGM   Have you had a dilated eye exam in the past 12 months? Yes    Have you had a dental exam in the past 12 months? Yes   7 months had some teeth pulled   Are you checking your feet? Yes    How many days per week are you checking your feet? 5      Dietary Intake   Breakfast 2 eggs, cheese, sausage, diet coke    Lunch skips to tring to lose weight, sometimes tomato sandwich    Snack (afternoon) bananas and peanut butter, some fruit    Dinner [employer] or steak,  instant potatoes, canned vegetables    Snack (evening) usually not, no-sugar added ice cream 1/2 gal lasts 2 weeks    Beverage(s) diet coke, 1-2 bottles water, no alcohol      Exercise   Exercise Type ADL's      Patient Education   Previous Diabetes Education No    Nutrition management  Role of diet in the treatment of diabetes and the relationship between the three main macronutrients and blood glucose level;Meal options for control of blood glucose level and chronic complications.    Medications Taught/reviewed insulin injection, site rotation, insulin storage and needle disposal.      Individualized Goals (developed by patient)   Nutrition General guidelines for healthy choices and portions discussed    Medications take my medication as prescribed      Outcomes    Expected Outcomes Demonstrated interest in learning. Expect positive outcomes    Future DMSE 2 months    Program Status Completed          Individualized Plan for Diabetes Self-Management Training:   Learning Objective:  Patient will have a greater understanding of diabetes self-management. Patient education plan is to attend individual and/or group sessions per assessed needs and concerns.   Patient Instructions  Tips for injecting insulin: Clean site before injecting insulin After opening a pen you can leave it out at room temperature for at least 14 days. This will prevent it from stinging when you inject. Rotate sites. Consider using more than just your left arm for injecting. After injecting leave needle in skin for the count of 5 instead of pulling it out immediately. Contact your doctor about the units of insulin you should be using. Use Novolog as directed, with or before meals instead of after meals. Take insulin with all your meals, even if just a sandwich  Diet: Cheese does not raise your blood sugar much, but eat in moderation because it has a type of fat that is not good to eat in large amounts. Aim to eat balanced meals with protein, carbohydrate and vegetables. All vegetables are good, the starchy vegetables you'll want to eat smaller portions.  Expected Outcomes:  Demonstrated interest in learning. Expect positive outcomes  Education material provided: [employer] for Diabetes  If problems or questions, patient to contact team via:  Phone  Future DSME appointment: 2 months

## 2020-01-04 ENCOUNTER — Telehealth: Payer: Self-pay

## 2020-01-04 DIAGNOSIS — R1310 Dysphagia, unspecified: Secondary | ICD-10-CM

## 2020-01-04 NOTE — Telephone Encounter (Signed)
Minda, speech therapist with [facility], called and stated she went out and did the initial evaluation for admission for John Roman on 01/01/2020 and states that he is not home bound and in order to qualify for speech therapy he would have to be home bound. She recommend that he get referred to [facility] for further evaluation and states that he does need the instrumental swallowing test. Jeraldine Loots said that he did okay with the tests she did perform that day, and she did show him some swallowing exercises to do in the meantime. She also said that she recommends he see Pulmonology due to his long history of smoking and wheezing upon exam.

## 2020-01-05 ENCOUNTER — Ambulatory Visit (INDEPENDENT_AMBULATORY_CARE_PROVIDER_SITE_OTHER): Payer: HMO | Admitting: Medical-Surgical

## 2020-01-05 ENCOUNTER — Other Ambulatory Visit: Payer: Self-pay

## 2020-01-05 VITALS — BP 120/71 | HR 82

## 2020-01-05 DIAGNOSIS — I10 Essential (primary) hypertension: Secondary | ICD-10-CM | POA: Diagnosis not present

## 2020-01-05 NOTE — Progress Notes (Signed)
   Subjective:    Patient ID: John Roman, male    DOB: Jun 24, 1955, 64 y.o.   MRN: [id number]  HPI Patient is here for blood pressure check. Denies trouble sleeping, palpitations, or medication problems.    Review of Systems     Objective:   Physical Exam        Assessment & Plan:  Patient states he has not been working on reducing sodium in diet but will start. First blood pressure reading was slightly elevated at 138/79. Patient was left in room to relax for 10 minutes and blood pressure came down to 120/71.   Discussed continuing on current medication regimen and reducing sodium in diet. Patient agreeable.

## 2020-01-08 ENCOUNTER — Other Ambulatory Visit: Payer: Self-pay | Admitting: Medical-Surgical

## 2020-01-14 ENCOUNTER — Telehealth: Payer: Self-pay

## 2020-01-14 ENCOUNTER — Other Ambulatory Visit: Payer: Self-pay | Admitting: Medical-Surgical

## 2020-01-14 MED ORDER — SENNOSIDES-DOCUSATE SODIUM 8.6-50 MG PO TABS: 2.0000 | ORAL_TABLET | Freq: Two times a day (BID) | ORAL | 0 refills | Status: DC | PRN

## 2020-01-14 NOTE — Telephone Encounter (Signed)
This is a common side effect while taking iron supplements. Black stools are a normal finding due to the iron. The red when he wiped this morning is likely from constipation and irritated hemorrhoids. We can try to switch up the type of iron to see if this helps but that may not make much difference. I would recommend starting a stool softener twice a day and making sure to drink plenty of water. Increase dietary fiber intake or consider adding in a fiber supplement. If needed, I can call in prescriptions for the stool softener and/or the fiber supplement.

## 2020-01-14 NOTE — Telephone Encounter (Signed)
Pt states that since starting the iron supplement that his stools have turned black and he is experiencing constipation. He states this morning he had a BM and there was some red blood when he wiped.

## 2020-01-14 NOTE — Telephone Encounter (Signed)
Pt aware of recommendations via voicemail. Instructed him to call back if he has any questions or concerns

## 2020-01-14 NOTE — Telephone Encounter (Signed)
I sent in Senokot-S that is a combination medication that works as a stool softener and a laxative. This is twice a day as needed until stooling regularly. I would recommend he start with twice a day and once having regular BMs, reduce to once a day with the option to take it twice if he gets constipated again.

## 2020-01-14 NOTE — Telephone Encounter (Signed)
Pt aware of recommendations. He would like for Rx's to be sent in for the stool softener as well as the fiber to see if his insurance will help pay for them. Pharmacy verified.

## 2020-01-22 ENCOUNTER — Telehealth: Payer: Self-pay

## 2020-01-22 DIAGNOSIS — R1310 Dysphagia, unspecified: Secondary | ICD-10-CM

## 2020-01-22 NOTE — Telephone Encounter (Signed)
Pt called checking on status of referral that was made for [name] on 01/04/2020.

## 2020-01-23 ENCOUNTER — Other Ambulatory Visit: Payer: Self-pay | Admitting: Medical-Surgical

## 2020-01-25 NOTE — Telephone Encounter (Signed)
Pt called back checking on the status of the referral for speech therapy, stating he is having a hard time swallowing. I called [facility] to check on the referral and was told that the pt had to have a modified swallow test done prior to being scheduled. She said this is done at [facility]. Orders tee'd up below ready for review and approval/denial.

## 2020-01-26 NOTE — Telephone Encounter (Signed)
Order for modified barium swallow imaging signed. Pt aware.

## 2020-02-01 ENCOUNTER — Other Ambulatory Visit (HOSPITAL_COMMUNITY): Payer: Self-pay

## 2020-02-01 DIAGNOSIS — R1310 Dysphagia, unspecified: Secondary | ICD-10-CM

## 2020-02-03 ENCOUNTER — Telehealth: Payer: Self-pay

## 2020-02-03 ENCOUNTER — Other Ambulatory Visit: Payer: Self-pay

## 2020-02-03 MED ORDER — ALBUTEROL SULFATE HFA 108 (90 BASE) MCG/ACT IN AERS: 2.0000 | INHALATION_SPRAY | Freq: Four times a day (QID) | RESPIRATORY_TRACT | 3 refills | Status: AC | PRN

## 2020-02-03 NOTE — Telephone Encounter (Signed)
Agree with this information. Patient assistance forms require information not available to [place]. If he can get those completed, there is a chance for approval which would help tremendously. If she has any further questions, she is welcome to call back and I will be glad to speak to her.

## 2020-02-03 NOTE — Telephone Encounter (Signed)
Darl Pikes with [employer] called wanting to discuss financial options for this pt. She also stated that when she had just spoken to the pt that he said he was out of his albuterol inhaler. She is wanting to know if we have any samples he could get.   I called her back and LVM letting her know that we do have applications for a patient assistance program and that he has been given this paperwork in the past. I told her all he needs to do is fill out the form, or have someone help him fill out the paperwork and get it back to [place] and we can send it in for approval/denial.   Also told her that we had not received a refill request from him or his pharmacy, but I will go ahead and send in refills for him. I told her that we do not have any samples of albuterol in the office.   Telephone number to office LVM if she needs to call back with any further questions or concerns.

## 2020-02-04 ENCOUNTER — Other Ambulatory Visit: Payer: Self-pay

## 2020-02-04 ENCOUNTER — Encounter (HOSPITAL_COMMUNITY): Payer: Self-pay

## 2020-02-04 ENCOUNTER — Ambulatory Visit (HOSPITAL_COMMUNITY)
Admission: RE | Admit: 2020-02-04 | Discharge: 2020-02-04 | Disposition: A | Discharge status: Home / Self Care | Payer: HMO | Source: Ambulatory Visit | Attending: Medical-Surgical | Admitting: Medical-Surgical

## 2020-02-04 DIAGNOSIS — R1310 Dysphagia, unspecified: Secondary | ICD-10-CM

## 2020-02-11 ENCOUNTER — Ambulatory Visit (HOSPITAL_COMMUNITY)
Admission: RE | Admit: 2020-02-11 | Discharge: 2020-02-11 | Disposition: A | Discharge status: Home / Self Care | Payer: HMO | Source: Ambulatory Visit | Attending: Medical-Surgical | Admitting: Medical-Surgical

## 2020-02-11 ENCOUNTER — Other Ambulatory Visit: Payer: Self-pay | Admitting: Medical-Surgical

## 2020-02-11 ENCOUNTER — Other Ambulatory Visit: Payer: Self-pay

## 2020-02-11 DIAGNOSIS — R1310 Dysphagia, unspecified: Secondary | ICD-10-CM | POA: Diagnosis not present

## 2020-02-24 ENCOUNTER — Telehealth: Payer: Self-pay

## 2020-02-24 DIAGNOSIS — Z550 Illiteracy and low-level literacy: Secondary | ICD-10-CM

## 2020-02-24 DIAGNOSIS — Z599 Problem related to housing and economic circumstances, unspecified: Secondary | ICD-10-CM

## 2020-02-24 NOTE — Telephone Encounter (Signed)
Social worker referral placed. I do not think they will go out to his home either but it's worth a try.

## 2020-02-24 NOTE — Telephone Encounter (Signed)
Darl Pikes with [employer] called and wanted to know if we had a way to get Legrand the help he needs to complete his financial assistance paperwork. I told her what was stated in the 02/03/2020 telephone encounter, but she is wanting to know if we have a Child psychotherapist within [place] that we can get to go out to his home and help him fill out the paperwork. She said that they have a Child psychotherapist but she is not able to go out to the pt's homes, she is virtual only. She also said that Aerion told her that he may have thrown the paperwork he got in the mail away because he did not know what it was.   Order for referral to social worker has been tee'd up below and is ready for review and approval/denial.

## 2020-02-24 NOTE — Telephone Encounter (Signed)
Darl Pikes with [employer] aware referral has been placed.

## 2020-02-26 ENCOUNTER — Ambulatory Visit: Payer: HMO | Admitting: Registered"

## 2020-03-07 ENCOUNTER — Other Ambulatory Visit: Payer: Self-pay

## 2020-03-07 DIAGNOSIS — E1165 Type 2 diabetes mellitus with hyperglycemia: Secondary | ICD-10-CM

## 2020-03-07 MED ORDER — NOVOLOG FLEXPEN 100 UNIT/ML ~~LOC~~ SOPN: 20.0000 [IU] | PEN_INJECTOR | Freq: Three times a day (TID) | SUBCUTANEOUS | 3 refills | Status: DC

## 2020-03-07 MED ORDER — TRESIBA FLEXTOUCH 100 UNIT/ML ~~LOC~~ SOPN: 34.0000 [IU] | PEN_INJECTOR | Freq: Every day | SUBCUTANEOUS | 3 refills | Status: DC

## 2020-03-07 MED ORDER — TRESIBA FLEXTOUCH 100 UNIT/ML ~~LOC~~ SOPN: 60.0000 [IU] | PEN_INJECTOR | Freq: Every day | SUBCUTANEOUS | 3 refills | Status: AC

## 2020-03-11 ENCOUNTER — Other Ambulatory Visit: Payer: Self-pay

## 2020-03-11 ENCOUNTER — Encounter: Payer: HMO | Attending: Medical-Surgical | Admitting: Registered"

## 2020-03-11 DIAGNOSIS — E1165 Type 2 diabetes mellitus with hyperglycemia: Secondary | ICD-10-CM | POA: Diagnosis not present

## 2020-03-11 NOTE — Progress Notes (Signed)
Diabetes Self-Management Education  Visit Type: Follow-up  Appt. Start Time: 0755 Appt. End Time: 0825  03/11/2020  [name], identified by name and date of birth, is a 65 y.o. male with a diagnosis of Diabetes: Type 2.   ASSESSMENT  There were no vitals taken for this visit. There is no height or weight on file to calculate BMI.   Pt reports since last visit he has lost a little weight, doesn't weight himself but can tell because his pant size has gone down from 42 inches to 38 inches also states he has a little more energy. Pt reports he gets up about 4 times per night to urinate and will also check blood sugar. Pt states he is still injecting Novolog 1 hr after meals and decides 30-40 units based on the CGM reading, patient states he still favors injecting his left arm, but has started leaving the needle injected for a count of 5 before removing needle.    Diabetes Self-Management Education - 03/11/20 0813      Visit Information   Visit Type Follow-up      Initial Visit   Diabetes Type Type 2    Are you currently following a meal plan? No      Psychosocial Assessment   Self-care barriers Low literacy      Complications   How often do you check your blood sugar? > 4 times/day   libre CGM     Dietary Intake   Breakfast 2 eggs, trukey bacon    Lunch skips    Dinner meat, instant potatoes    Beverage(s) water, diet coke      Exercise   Exercise Type ADL's      Patient Education   Medications Reviewed patients medication for diabetes, action, purpose, timing of dose and side effects.      Individualized Goals (developed by patient)   Medications take my medication as prescribed    Monitoring  test my blood glucose as discussed   use finger prick before using insulin if CGM reading has symbol     Outcomes   Expected Outcomes Demonstrated interest in learning. Expect positive outcomes    Future DMSE 2 months    Program Status Completed      Subsequent Visit    Since your last visit have you continued or begun to take your medications as prescribed? No   60 u long acting 9-11 pm; 30-40 units 1 hours after meals   Since your last visit have you experienced any weight changes? Loss    Weight Loss (lbs) --   pants down to 38 in from 42 in          Individualized Plan for Diabetes Self-Management Training:   Learning Objective:  Patient will have a greater understanding of diabetes self-management. Patient education plan is to attend individual and/or group sessions per assessed needs and concerns.   Plan:   Patient Instructions  Continue taking your long acting Insulin in the evening.  Start taking the rapid acting (Novolog) before you eat instead of waiting until after you eat.   Consider moving around to different spots to inject insulin.  If your Josephine Igo reading shows a picture of a blood drop that means you should prick your finger for a more accurate reading before making a treatment decision (insulin or having something sweet when low)   Expected Outcomes:  Demonstrated interest in learning. Expect positive outcomes  Education material provided: none  If problems or  questions, patient to contact team via:  Phone  Future DSME appointment: 2 months

## 2020-03-11 NOTE — Patient Instructions (Signed)
Continue taking your long acting Insulin in the evening.  Start taking the rapid acting (Novolog) before you eat instead of waiting until after you eat.   Consider moving around to different spots to inject insulin.  If your John Roman reading shows a picture of a blood drop that means you should prick your finger for a more accurate reading before making a treatment decision (insulin or having something sweet when low)

## 2020-03-12 ENCOUNTER — Other Ambulatory Visit: Payer: Self-pay | Admitting: Medical-Surgical

## 2020-03-14 ENCOUNTER — Ambulatory Visit: Payer: HMO | Admitting: Registered"

## 2020-03-15 ENCOUNTER — Encounter: Payer: Self-pay | Admitting: Medical-Surgical

## 2020-03-15 ENCOUNTER — Ambulatory Visit (INDEPENDENT_AMBULATORY_CARE_PROVIDER_SITE_OTHER): Payer: HMO | Admitting: Medical-Surgical

## 2020-03-15 ENCOUNTER — Other Ambulatory Visit: Payer: Self-pay

## 2020-03-15 VITALS — BP 145/84 | HR 85 | Temp 97.9°F | Ht 67.0 in | Wt 278.9 lb

## 2020-03-15 DIAGNOSIS — E785 Hyperlipidemia, unspecified: Secondary | ICD-10-CM | POA: Diagnosis not present

## 2020-03-15 DIAGNOSIS — I6300 Cerebral infarction due to thrombosis of unspecified precerebral artery: Secondary | ICD-10-CM

## 2020-03-15 DIAGNOSIS — I10 Essential (primary) hypertension: Secondary | ICD-10-CM | POA: Diagnosis not present

## 2020-03-15 DIAGNOSIS — K219 Gastro-esophageal reflux disease without esophagitis: Secondary | ICD-10-CM

## 2020-03-15 DIAGNOSIS — R635 Abnormal weight gain: Secondary | ICD-10-CM | POA: Diagnosis not present

## 2020-03-15 DIAGNOSIS — D509 Iron deficiency anemia, unspecified: Secondary | ICD-10-CM | POA: Diagnosis not present

## 2020-03-15 DIAGNOSIS — R059 Cough, unspecified: Secondary | ICD-10-CM

## 2020-03-15 DIAGNOSIS — E1165 Type 2 diabetes mellitus with hyperglycemia: Secondary | ICD-10-CM

## 2020-03-15 MED ORDER — SENNOSIDES-DOCUSATE SODIUM 8.6-50 MG PO TABS: 2.0000 | ORAL_TABLET | Freq: Two times a day (BID) | ORAL | 3 refills | Status: DC | PRN

## 2020-03-15 MED ORDER — AZITHROMYCIN 250 MG PO TABS: ORAL_TABLET | ORAL | 0 refills | Status: DC

## 2020-03-15 MED ORDER — GUAIFENESIN ER 600 MG PO TB12
600.0000 mg | ORAL_TABLET | Freq: Two times a day (BID) | ORAL | 0 refills | Status: AC
Start: 2020-03-15 — End: ?

## 2020-03-15 NOTE — Progress Notes (Signed)
Subjective:    CC: review medications, SOB  HPI: Pleasant 65 year old male presenting today for the following:  He would like to review his medications and make sure he is on all the medications he should be.  DM- using the CGM and notes the lowest recent reading of 69 and the highest of 380 (post-meal). Taking his metformin and insulins as prescribed. Poor dietary choices, high in carbohydrates. When sugar was 69, he ate a sandwich and 3 reese's cup to get it back up.   HTN- BP elevated on arrival which is typical of his pattern. Taking Losartan as prescribed.   CVA- taking ASA 81mg  but is not able to confirm if he is taking Plavix or not. Thinks he never started that medication but isn't sure.   HLD- taking Lipitor 80mg  daily as prescribed.   GERD- taking Pantoprazole 40mg  daily, well controlled.   Iron deficiency- was taking iron 3 times daily but had GI side effects. Reduced it to once daily and is able to tolerate that dose. Ran out and needs to get more from the pharmacy.   Weight gain- has gained about 45lbs in the last year. Not currently exercising and not following a diabetic diet. No portion control. Is aware that the extra weight is bad for him but notes his legs bother him too much to exercise.   SOB- smoker for 21 years, 1-1.5ppd of cigarettes. Recent marijuana use, smoking and/or edibles. Continues to have shortness of breath and wheezing. His wheezing is more in the throat/posterior oropharynx. Has had some congestion/PND but it's not getting any better. Passed his swallow eval recently but notes that 30 minutes after the test, he had a sandwich with mustard and got choked. Using albuterol inhaler 2-3 times daily which helps him feel like he can breathe better.   I reviewed the past medical history, family history, social history, surgical history, and allergies today and no changes were needed.  Please see the problem list section below in epic for further details.  Past  Medical History: Past Medical History:  Diagnosis Date  . ANXIETY 07/04/2007  . ASTHMATIC BRONCHITIS, ACUTE 12/19/2007  . DIABETES MELLITUS, TYPE II 09/28/2006  . ERECTILE DYSFUNCTION 09/28/2006  . GERD 09/28/2006  . Hematochezia 05/29/2010  . HYPERLIPIDEMIA 09/28/2006  . HYPERTENSION 09/28/2006  . Illiterate 09/11/2012  . LOW BACK PAIN 09/28/2006  . OBESITY 09/28/2006  . PLANTAR FASCIITIS, BILATERAL 07/04/2007  . Stroke [facility])    Past Surgical History: Past Surgical History:  Procedure Laterality Date  . APPENDECTOMY     Social History: Social History   Socioeconomic History  . Marital status: Married    Spouse name: Not on file  . Number of children: 1  . Years of education: Not on file  . Highest education level: Not on file  Occupational History  . Occupation: Retired 09/30/2006  Tobacco Use  . Smoking status: Former 07/06/2007  . Smokeless tobacco: Never Used  . Tobacco comment: quit at age 47  Vaping Use  . Vaping Use: Never used  Substance and Sexual Activity  . Alcohol use: No  . Drug use: Not Currently    Types: Marijuana    Comment: occasional marijanua use anytime he can  . Sexual activity: Yes    Partners: Female  Other Topics Concern  . Not on file  Social History Narrative  . Not on file   Social Determinants of Health   Financial Resource Strain: Not on file  Food Insecurity: Not on file  Transportation Needs: Not on file  Physical Activity: Not on file  Stress: Not on file  Social Connections: Not on file   Family History: Family History  Problem Relation Age of Onset  . Colon polyps Sister   . Stroke Sister   . Lung cancer Sister   . Coronary artery disease Brother   . Heart attack Brother   . Diabetes Brother        2 brothers   . Hypertension Brother   . Stroke Brother   . Heart attack Mother   . Bone cancer Sister   . Brain cancer Sister    Allergies: Allergies  Allergen Reactions  . Hydrochlorothiazide Other (See Comments)     Dizziness   Medications: See med rec.  Review of Systems: See HPI for pertinent positives and negatives.   Objective:    General: Well Developed, well nourished, and in no acute distress.  Neuro: Alert and oriented x3.  HEENT: Normocephalic, atraumatic, pupils equal round reactive to light, neck supple, no masses, no lymphadenopathy, thyroid nonpalpable. Audible congestion in the throat/posterior oropharynx with respirations. Skin: Warm and dry. Cardiac: Regular rate and rhythm, no murmurs rubs or gallops, no lower extremity edema.  Respiratory: Clear to auscultation bilaterally. Not using accessory muscles, speaking in full sentences.  Impression and Recommendations:    1. Uncontrolled type 2 diabetes mellitus with hyperglycemia (HCC) Discussed appropriate foods for a diabetic diet.  Also reviewed appropriate measures to take when glucose levels are low.  Advised to eat small amounts of carbohydrates to resume a normal blood glucose but to avoid concentrated sweets for overeating as this tends to have the opposite effect and causes hyperglycemia.  Continue his current regimen for now.  He will be due for an A1c check in 1 week.  2. Essential hypertension, benign Continue losartan 25 mg daily.  Blood pressure on arrival usually elevated with a return to normal by the end of the appointment.  Suspect this is a bit of whitecoat syndrome.  3. Hyperlipidemia, unspecified hyperlipidemia type Continue atorvastatin 80 mg daily.  4. Gastroesophageal reflux disease, unspecified whether esophagitis present Continue Protonix 40 mg daily.  5. Cerebrovascular accident (CVA) due to thrombosis of precerebral artery (HCC) Continue ASA 81 mg daily.  Will contact pharmacy to see if he has been receiving Plavix as prescribed.  6. Cough/SOB Continue albuterol inhaler every 6 hours as needed.  We will schedule him for spirometry.  We did a short walk test that included two laps around our office.  During  this walk test he did desaturate to 89% on room air and had audible wheezing that appears to be centered in the trachea.  7. Weight gain Discussed weight gain in the past 12 months.  This is making it difficult for his diabetes management as well as his blood pressure and mobility.  Strongly recommend increasing activity and making dietary modifications with a goal of losing at least 40 pounds.  8. Iron deficiency anemia, unspecified iron deficiency anemia type Resume ferrous sulfate 325 mg daily.  We will plan to recheck his CBC and iron panel next week.  Return in about 1 week (around 03/22/2020) for spirometry testing. ___________________________________________ Thayer Ohm, DNP, APRN, FNP-BC Primary Care and Sports Medicine [facility]

## 2020-03-24 ENCOUNTER — Ambulatory Visit (INDEPENDENT_AMBULATORY_CARE_PROVIDER_SITE_OTHER): Payer: HMO | Admitting: Medical-Surgical

## 2020-03-24 ENCOUNTER — Other Ambulatory Visit: Payer: Self-pay

## 2020-03-24 VITALS — BP 134/71 | HR 90

## 2020-03-24 DIAGNOSIS — R0600 Dyspnea, unspecified: Secondary | ICD-10-CM

## 2020-03-24 DIAGNOSIS — R059 Cough, unspecified: Secondary | ICD-10-CM | POA: Diagnosis not present

## 2020-03-24 DIAGNOSIS — E1165 Type 2 diabetes mellitus with hyperglycemia: Secondary | ICD-10-CM | POA: Diagnosis not present

## 2020-03-24 DIAGNOSIS — R062 Wheezing: Secondary | ICD-10-CM | POA: Diagnosis not present

## 2020-03-24 DIAGNOSIS — R0609 Other forms of dyspnea: Secondary | ICD-10-CM

## 2020-03-24 LAB — POCT GLYCOSYLATED HEMOGLOBIN (HGB A1C): Hemoglobin A1C: 8.5 % — AB (ref 4.0–5.6)

## 2020-03-24 MED ORDER — ALBUTEROL SULFATE HFA 108 (90 BASE) MCG/ACT IN AERS
4.0000 | INHALATION_SPRAY | Freq: Once | RESPIRATORY_TRACT | Status: AC
  Administered 2020-03-24: 4 via RESPIRATORY_TRACT

## 2020-03-24 NOTE — Progress Notes (Signed)
Subjective:    CC: spirometry and DM  HPI: Pleasant 65 year old male presenting today for completion of spirometry and to have his A1c checked.   Still having DOE, wheezing, coughing, and desaturations on ambulation. Cough is nonproductive. Using Albuterol inhaler several times daily which does help some but never provides relief.   DM- sugar in a normal range all night until eating breakfast this morning. After that, his sugar skyrocketed. Notes he did not take any insulin this morning.   I reviewed the past medical history, family history, social history, surgical history, and allergies today and no changes were needed.  Please see the problem list section below in epic for further details.  Past Medical History: Past Medical History:  Diagnosis Date  . ANXIETY 07/04/2007  . ASTHMATIC BRONCHITIS, ACUTE 12/19/2007  . DIABETES MELLITUS, TYPE II 09/28/2006  . ERECTILE DYSFUNCTION 09/28/2006  . GERD 09/28/2006  . Hematochezia 05/29/2010  . HYPERLIPIDEMIA 09/28/2006  . HYPERTENSION 09/28/2006  . Illiterate 09/11/2012  . LOW BACK PAIN 09/28/2006  . OBESITY 09/28/2006  . PLANTAR FASCIITIS, BILATERAL 07/04/2007  . Stroke [facility])    Past Surgical History: Past Surgical History:  Procedure Laterality Date  . APPENDECTOMY     Social History: Social History   Socioeconomic History  . Marital status: Married    Spouse name: Not on file  . Number of children: 1  . Years of education: Not on file  . Highest education level: Not on file  Occupational History  . Occupation: Retired Personnel officer  Tobacco Use  . Smoking status: Former Games developer  . Smokeless tobacco: Never Used  . Tobacco comment: quit at age 65  Vaping Use  . Vaping Use: Never used  Substance and Sexual Activity  . Alcohol use: No  . Drug use: Not Currently    Types: Marijuana    Comment: occasional marijanua use anytime he can  . Sexual activity: Yes    Partners: Female  Other Topics Concern  . Not on file  Social  History Narrative  . Not on file   Social Determinants of Health   Financial Resource Strain: Not on file  Food Insecurity: Not on file  Transportation Needs: Not on file  Physical Activity: Not on file  Stress: Not on file  Social Connections: Not on file   Family History: Family History  Problem Relation Age of Onset  . Colon polyps Sister   . Stroke Sister   . Lung cancer Sister   . Coronary artery disease Brother   . Heart attack Brother   . Diabetes Brother        2 brothers   . Hypertension Brother   . Stroke Brother   . Heart attack Mother   . Bone cancer Sister   . Brain cancer Sister    Allergies: Allergies  Allergen Reactions  . Hydrochlorothiazide Other (See Comments)    Dizziness   Medications: See med rec.  Review of Systems: See HPI for pertinent positives and negatives.   Objective:    General: Well Developed, well nourished, and in no acute distress.  Neuro: Alert and oriented x3.  HEENT: Normocephalic, atraumatic.  Skin: Warm and dry. Cardiac: Regular rate and rhythm.  Respiratory: Not using accessory muscles, speaking in full sentences.   Impression and Recommendations:    1. Uncontrolled type 2 diabetes mellitus with hyperglycemia (HCC) POCT HgbA1c 8.5%. Discussed administration of insulin prior to meals to prevent spike post-prandially. Reinforced appropriate dietary intake for diabetes and treatment for hypoglycemic  episodes.  - POCT HgB A1C  2. Cough/wheezing/DOE Spirometry completed in office with FEV1 of 82%. Unfortunately this was done about 1 hour after using his albuterol inhaler. With the continued difficulty breathing and desats on ambulation, referring to pulmonology for further evaluation.  - albuterol (VENTOLIN HFA) 108 (90 Base) MCG/ACT inhaler 4 puff - PR EVAL OF BRONCHOSPASM - Ambulatory referral to Pulmonology  Return in about 3 months (around 06/21/2020) for DM/HTN/HLD follow  up.  ___________________________________________ Thayer Ohm, DNP, APRN, FNP-BC Primary Care and Sports Medicine [facility]

## 2020-04-01 ENCOUNTER — Encounter: Payer: Self-pay | Admitting: Pulmonary Disease

## 2020-04-08 ENCOUNTER — Other Ambulatory Visit: Payer: Self-pay | Admitting: Medical-Surgical

## 2020-04-14 ENCOUNTER — Other Ambulatory Visit: Payer: Self-pay

## 2020-04-14 ENCOUNTER — Encounter: Payer: Self-pay | Admitting: Pulmonary Disease

## 2020-04-14 ENCOUNTER — Ambulatory Visit: Payer: HMO | Admitting: Pulmonary Disease

## 2020-04-14 ENCOUNTER — Ambulatory Visit (INDEPENDENT_AMBULATORY_CARE_PROVIDER_SITE_OTHER): Payer: HMO

## 2020-04-14 VITALS — BP 130/74 | HR 76 | Temp 98.3°F | Ht 67.0 in | Wt 283.2 lb

## 2020-04-14 DIAGNOSIS — R0600 Dyspnea, unspecified: Secondary | ICD-10-CM | POA: Diagnosis not present

## 2020-04-14 DIAGNOSIS — R0609 Other forms of dyspnea: Secondary | ICD-10-CM

## 2020-04-14 DIAGNOSIS — R059 Cough, unspecified: Secondary | ICD-10-CM

## 2020-04-14 MED ORDER — TRELEGY ELLIPTA 100-62.5-25 MCG/INH IN AEPB: 1.0000 | INHALATION_SPRAY | Freq: Every day | RESPIRATORY_TRACT | 0 refills | Status: DC

## 2020-04-14 MED ORDER — TRELEGY ELLIPTA 100-62.5-25 MCG/INH IN AEPB: 1.0000 | INHALATION_SPRAY | Freq: Every day | RESPIRATORY_TRACT | 11 refills | Status: DC

## 2020-04-14 NOTE — Patient Instructions (Addendum)
Nice to meet you  I prescribed a new inhaler called Trelegy, use 1 puff every day.  Continue to use your other inhaler every 6 hours as needed.  I hope after you start this new inhaler and use it every day, you will not need to use your old inhaler as frequently.  39-month follow-up with Dr. Judeth Horn

## 2020-04-15 ENCOUNTER — Telehealth: Payer: Self-pay

## 2020-04-15 NOTE — Telephone Encounter (Signed)
Pt called and LVM stating that he saw Pulmo yesterday and was started on Trellegy inhaler. He wants to know if this is a med that will interact with any of his other meds. Please advise.

## 2020-04-15 NOTE — Telephone Encounter (Signed)
Patient advised of recommendations.  

## 2020-04-15 NOTE — Telephone Encounter (Signed)
His new inhaler should be fine with his other medications. It does have a bit of a steroid which can bump blood sugars up just a bit but it shouldn't affect it much.

## 2020-04-25 ENCOUNTER — Ambulatory Visit: Payer: HMO

## 2020-04-29 ENCOUNTER — Other Ambulatory Visit: Payer: Self-pay | Admitting: Medical-Surgical

## 2020-05-06 ENCOUNTER — Ambulatory Visit (INDEPENDENT_AMBULATORY_CARE_PROVIDER_SITE_OTHER): Payer: HMO | Admitting: Medical-Surgical

## 2020-05-06 ENCOUNTER — Encounter: Payer: HMO | Attending: Medical-Surgical | Admitting: Registered"

## 2020-05-06 ENCOUNTER — Other Ambulatory Visit: Payer: Self-pay

## 2020-05-06 ENCOUNTER — Ambulatory Visit: Payer: HMO

## 2020-05-06 ENCOUNTER — Other Ambulatory Visit: Payer: Self-pay | Admitting: Medical-Surgical

## 2020-05-06 ENCOUNTER — Encounter: Payer: Self-pay | Admitting: Registered"

## 2020-05-06 VITALS — BP 149/70 | HR 78 | Resp 18 | Ht 69.5 in | Wt 278.0 lb

## 2020-05-06 DIAGNOSIS — E1165 Type 2 diabetes mellitus with hyperglycemia: Secondary | ICD-10-CM | POA: Diagnosis not present

## 2020-05-06 DIAGNOSIS — Z0000 Encounter for general adult medical examination without abnormal findings: Secondary | ICD-10-CM

## 2020-05-06 DIAGNOSIS — Z794 Long term (current) use of insulin: Secondary | ICD-10-CM | POA: Insufficient documentation

## 2020-05-06 NOTE — Progress Notes (Signed)
MEDICARE ANNUAL WELLNESS VISIT  05/06/2020  Subjective:  John Roman is a 65 y.o. male patient of Christen Butter, NP who had a Medicare Annual Wellness Visit today. Dquan is Retired and lives with their spouse. he has 1 child. he reports that he is socially active and does interact with friends/family regularly. he is minimally physically active and enjoys working on old phones.  Patient Care Team: Christen Butter, NP as PCP - General (Nurse Practitioner)  Advanced Directives 05/06/2020 12/25/2019 02/19/2019 07/25/2018 05/01/2017 05/01/2017 04/06/2017  Does Patient Have a Medical Advance Directive? No No No Yes No No -  Does patient want to make changes to medical advance directive? - - - No - Patient declined - - -  Would patient like information on creating a medical advance directive? No - Patient declined No - Patient declined No - Patient declined - No - Patient declined No - Patient declined Yes (Inpatient - patient requests chaplain consult to create a medical advance directive)    Hospital Utilization Over the Past 12 Months: # of hospitalizations or ER visits: 0 # of surgeries: 0  Review of Systems    Patient reports that his overall health is worse when compared to last year.  Review of Systems: History obtained from chart review and the patient  All other systems negative.  Pain Assessment Pain : No/denies pain     Current Medications & Allergies (verified) Allergies as of 05/06/2020      Reactions   Hydrochlorothiazide Other (See Comments)   Dizziness      Medication List       Accurate as of May 06, 2020  2:17 PM. If you have any questions, ask your nurse or doctor.        albuterol 108 (90 Base) MCG/ACT inhaler Commonly known as: VENTOLIN HFA Inhale 2 puffs into the lungs every 6 (six) hours as needed for wheezing or shortness of breath.   aspirin 81 MG EC tablet Take 1 tablet (81 mg total) by mouth daily.   atorvastatin 80 MG tablet Commonly known as:  LIPITOR TAKE 1 TABLET (80 MG TOTAL) BY MOUTH DAILY AT 6 PM.   clopidogrel 75 MG tablet Commonly known as: PLAVIX Take 1 tablet (75 mg total) by mouth daily.   ferrous sulfate 325 (65 FE) MG EC tablet Take 1 tablet (325 mg total) by mouth 3 (three) times daily with meals.   FreeStyle Libre 14 Day Reader Hardie Pulley See admin instructions. Use to test blood sugar   FreeStyle Libre 14 Day Sensor Misc USE EVERY 14 DAYS   guaiFENesin 600 MG 12 hr tablet Commonly known as: Mucinex Take 1 tablet (600 mg total) by mouth 2 (two) times daily.   losartan 25 MG tablet Commonly known as: COZAAR Take 1 tablet (25 mg total) by mouth daily.   metFORMIN 500 MG tablet Commonly known as: GLUCOPHAGE TAKE 1 TABLET BY MOUTH 2 TIMES DAILY WITH A MEAL.   NovoLOG FlexPen 100 UNIT/ML FlexPen Generic drug: insulin aspart Inject 20-25 Units into the skin 3 (three) times daily before meals. PRN   pantoprazole 40 MG tablet Commonly known as: PROTONIX Take 1 tablet (40 mg total) by mouth daily. TAKE 1 TABLET BY MOUTH EVERY DAY   Pen Needles 32G X 4 MM Misc 180 each by Does not apply route 2 (two) times daily.   senna-docusate 8.6-50 MG tablet Commonly known as: Senokot-S Take 2 tablets by mouth 2 (two) times daily as needed for mild constipation. Until stooling  regularly   Trelegy Ellipta 100-62.5-25 MCG/INH Aepb Generic drug: Fluticasone-Umeclidin-Vilant Inhale 1 puff into the lungs daily.   Trelegy Ellipta 100-62.5-25 MCG/INH Aepb Generic drug: Fluticasone-Umeclidin-Vilant Inhale 1 puff into the lungs daily.   Evaristo Bury FlexTouch 100 UNIT/ML FlexTouch Pen Generic drug: insulin degludec Inject 60 Units into the skin daily.       History (reviewed): Past Medical History:  Diagnosis Date  . ANXIETY 07/04/2007  . ASTHMATIC BRONCHITIS, ACUTE 12/19/2007  . DIABETES MELLITUS, TYPE II 09/28/2006  . ERECTILE DYSFUNCTION 09/28/2006  . GERD 09/28/2006  . Hematochezia 05/29/2010  . HYPERLIPIDEMIA  09/28/2006  . HYPERTENSION 09/28/2006  . Illiterate 09/11/2012  . LOW BACK PAIN 09/28/2006  . OBESITY 09/28/2006  . PLANTAR FASCIITIS, BILATERAL 07/04/2007  . Stroke [facility])    Past Surgical History:  Procedure Laterality Date  . APPENDECTOMY     Family History  Problem Relation Age of Onset  . Colon polyps Sister   . Stroke Sister   . Lung cancer Sister   . Coronary artery disease Brother   . Heart attack Brother   . Diabetes Brother        2 brothers   . Hypertension Brother   . Stroke Brother   . Heart attack Mother   . Bone cancer Sister   . Brain cancer Sister    Social History   Socioeconomic History  . Marital status: Married    Spouse name: Not on file  . Number of children: 1  . Years of education: Not on file  . Highest education level: Not on file  Occupational History  . Occupation: Retired Personnel officer  Tobacco Use  . Smoking status: Former Games developer  . Smokeless tobacco: Never Used  . Tobacco comment: quit at age 68  Vaping Use  . Vaping Use: Never used  Substance and Sexual Activity  . Alcohol use: No  . Drug use: Not Currently    Types: Marijuana    Comment: occasional marijanua use anytime he can  . Sexual activity: Yes    Partners: Female  Other Topics Concern  . Not on file  Social History Narrative  . Not on file   Social Determinants of Health   Financial Resource Strain: Low Risk   . Difficulty of Paying Living Expenses: Not hard at all  Food Insecurity: No Food Insecurity  . Worried About Programme researcher, broadcasting/film/video in the Last Year: Never true  . Ran Out of Food in the Last Year: Never true  Transportation Needs: No Transportation Needs  . Lack of Transportation (Medical): No  . Lack of Transportation (Non-Medical): No  Physical Activity: Inactive  . Days of Exercise per Week: 0 days  . Minutes of Exercise per Session: 0 min  Stress: No Stress Concern Present  . Feeling of Stress : Not at all  Social Connections: Socially Isolated  . Frequency  of Communication with Friends and Family: Once a week  . Frequency of Social Gatherings with Friends and Family: Never  . Attends Religious Services: Never  . Active Member of Clubs or Organizations: No  . Attends Banker Meetings: Never  . Marital Status: Married    Activities of Daily Living In your present state of health, do you have any difficulty performing the following activities: 05/06/2020  Hearing? Y  Comment has noticed some hearing loss  Vision? N  Difficulty concentrating or making decisions? Y  Comment difficulty concentrating  Walking or climbing stairs? N  Dressing or bathing? N  Doing errands, shopping? N  Preparing Food and eating ? N  Using the Toilet? N  In the past six months, have you accidently leaked urine? N  Do you have problems with loss of bowel control? N  Managing your Medications? N  Managing your Finances? N  Housekeeping or managing your Housekeeping? N  Some recent data might be hidden    Patient Education/Literacy How often do you need to have someone help you when you read instructions, pamphlets, or other written materials from your doctor or pharmacy?: 5 - Always What is the last grade level you completed in school?: Doesn't remember  Exercise Current Exercise Habits: The patient does not participate in regular exercise at present, Exercise limited by: None identified  Diet Patient reports consuming 2-3 meals a day and 0 snack(s) a day Patient reports that his primary diet is: Regular Patient reports that she does have regular access to food.   Depression Screen PHQ 2/9 Scores 05/06/2020 12/25/2019 03/06/2019 02/19/2019 04/21/2018 05/01/2017 10/02/2013  PHQ - 2 Score 0 0 0 0 0 0 0  PHQ- 9 Score - - 0 - - - -     Fall Risk Fall Risk  05/06/2020 12/25/2019 03/06/2019 04/21/2018 05/01/2017  Falls in the past year? 1 0 0 0 No  Number falls in past yr: 1 - - - -  Injury with Fall? 1 - - - -  Risk for fall due to : Impaired  balance/gait - - - -  Follow up Falls evaluation completed;Education provided;Falls prevention discussed - - Falls evaluation completed -     Objective:   Ht 5' 9.5" (1.765 m)   Wt 278 lb (126.1 kg)   BMI 40.46 kg/m   Last Weight  Most recent update: 05/06/2020  2:05 PM   Weight  126.1 kg (278 lb)            Body mass index is 40.46 kg/m.  Hearing/Vision  . Barton did not have difficulty with hearing/understanding during the face-to-face interview . Malachi did not have difficulty with his vision during the face-to-face interview . Reports that he has had a formal eye exam by an eye care professional within the past year . Reports that he has not had a formal hearing evaluation within the past year  Cognitive Function: 6CIT Screen 05/06/2020  What Year? 4 points  What month? 0 points  What time? 0 points  Count back from 20 0 points  Months in reverse 4 points  Repeat phrase 4 points  Total Score 12    Normal Cognitive Function Screening: Yes (Normal:0-7, Significant for Dysfunction: >8)  Immunization & Health Maintenance Record Immunization History  Administered Date(s) Administered  . PFIZER(Purple Top)SARS-COV-2 Vaccination 09/14/2019, 10/15/2019  . Td 02/05/2002    Health Maintenance  Topic Date Due  . OPHTHALMOLOGY EXAM  Never done  . PNA vac Low Risk Adult (1 of 2 - PCV13) Never done  . COVID-19 Vaccine (3 - Booster for Pfizer series) 04/13/2020  . TETANUS/TDAP  06/25/2020 (Originally 02/06/2012)  . COLONOSCOPY (Pts 45-19yrs Insurance coverage will need to be confirmed)  05/28/2020  . FOOT EXAM  08/18/2020  . INFLUENZA VACCINE  09/05/2020  . HEMOGLOBIN A1C  09/21/2020  . Hepatitis C Screening  Completed  . HPV VACCINES  Aged Out       Assessment  This is a routine wellness examination for Bartholome Bill.  Health Maintenance: Due or Overdue Health Maintenance Due  Topic Date Due  . OPHTHALMOLOGY EXAM  Never done  . PNA vac Low Risk Adult (1 of 2 - PCV13)  Never done  . COVID-19 Vaccine (3 - Booster for Pfizer series) 04/13/2020    Bartholome Bill does not need a referral for Community Assistance: Care Management:   no Social Work:    no Prescription Assistance:  no Nutrition/Diabetes Education:  no   Plan:  Personalized Goals Goals Addressed              This Visit's Progress   .  Patient Stated (pt-stated)        05/06/2020 AWV Goal: Exercise for General Health   Patient will verbalize understanding of the benefits of increased physical activity:  Exercising regularly is important. It will improve your overall fitness, flexibility, and endurance.  Regular exercise also will improve your overall health. It can help you control your weight, reduce stress, and improve your bone density.  Over the next year, patient will increase physical activity as tolerated with a goal of at least 150 minutes of moderate physical activity per week.   You can tell that you are exercising at a moderate intensity if your heart starts beating faster and you start breathing faster but can still hold a conversation.  Moderate-intensity exercise ideas include:  Walking 1 mile (1.6 km) in about 15 minutes  Biking  Hiking  Golfing  Dancing  Water aerobics  Patient will verbalize understanding of everyday activities that increase physical activity by providing examples like the following: ? Yard work, such as: ? Pushing a Surveyor, mining ? Raking and bagging leaves ? Washing your car ? Pushing a stroller ? Shoveling snow ? Gardening ? Washing windows or floors  Patient will be able to explain general safety guidelines for exercising:   Before you start a new exercise program, talk with your health care provider.  Do not exercise so much that you hurt yourself, feel dizzy, or get very short of breath.  Wear comfortable clothes and wear shoes with good support.  Drink plenty of water while you exercise to prevent dehydration or heat  stroke.  Work out until your breathing and your heartbeat get faster.       Personalized Health Maintenance & Screening Recommendations  Pneumococcal vaccine  Influenza vaccine Colorectal cancer screening Shingles vaccine Covid vaccine booster  Patient refused all of the recommendations at this time.  Lung Cancer Screening Recommended: no (Low Dose CT Chest recommended if Age 47-80 years, 30 pack-year currently smoking OR have quit w/in past 15 years) Hepatitis C Screening recommended: no HIV Screening recommended: no  Advanced Directives: Written information was not given per the patient's request.  Referrals & Orders No orders of the defined types were placed in this encounter.   Follow-up Plan . Follow-up with Christen Butter, NP as planned . Medicare wellness exam in one year. .    I have personally reviewed and noted the following in the patient's chart:   . Medical and social history . Use of alcohol, tobacco or illicit drugs  . Current medications and supplements . Functional ability and status . Nutritional status . Physical activity . Advanced directives . List of other physicians . Hospitalizations, surgeries, and ER visits in previous 12 months . Vitals . Screenings to include cognitive, depression, and falls . Referrals and appointments  In addition, I have reviewed and discussed with patient certain preventive protocols, quality metrics, and best practice recommendations. A written personalized care plan for preventive services as well as general preventive health recommendations  were provided to patient.     [place] CharonBableen  Ricca Melgarejo, RN  05/06/2020

## 2020-05-06 NOTE — Patient Instructions (Addendum)
MEDICARE ANNUAL WELLNESS VISIT Health Maintenance Summary and Written Plan of Care  John Roman ,  Thank you for allowing me to perform your Medicare Annual Wellness Visit and for your ongoing commitment to your health.   Health Maintenance & Immunization History Health Maintenance  Topic Date Due  . OPHTHALMOLOGY EXAM  05/06/2020 (Originally 05/28/1965)  . COVID-19 Vaccine (3 - Booster for Pfizer series) 05/22/2020 (Originally 04/13/2020)  . TETANUS/TDAP  06/25/2020 (Originally 02/06/2012)  . PNA vac Low Risk Adult (1 of 2 - PCV13) 05/06/2021 (Originally 05/28/2018)  . COLONOSCOPY (Pts 45-66yrs Insurance coverage will need to be confirmed)  05/28/2020  . FOOT EXAM  08/18/2020  . INFLUENZA VACCINE  09/05/2020  . HEMOGLOBIN A1C  09/21/2020  . Hepatitis C Screening  Completed  . HPV VACCINES  Aged Out   Immunization History  Administered Date(s) Administered  . PFIZER(Purple Top)SARS-COV-2 Vaccination 09/14/2019, 10/15/2019  . Td 02/05/2002    These are the patient goals that we discussed: Goals Addressed              This Visit's Progress   .  Patient Stated (pt-stated)        05/06/2020 AWV Goal: Exercise for General Health   Patient will verbalize understanding of the benefits of increased physical activity:  Exercising regularly is important. It will improve your overall fitness, flexibility, and endurance.  Regular exercise also will improve your overall health. It can help you control your weight, reduce stress, and improve your bone density.  Over the next year, patient will increase physical activity as tolerated with a goal of at least 150 minutes of moderate physical activity per week.   You can tell that you are exercising at a moderate intensity if your heart starts beating faster and you start breathing faster but can still hold a conversation.  Moderate-intensity exercise ideas include:  Walking 1 mile (1.6 km) in about 15  minutes  Biking  Hiking  Golfing  Dancing  Water aerobics  Patient will verbalize understanding of everyday activities that increase physical activity by providing examples like the following: ? Yard work, such as: ? Pushing a Surveyor, mining ? Raking and bagging leaves ? Washing your car ? Pushing a stroller ? Shoveling snow ? Gardening ? Washing windows or floors  Patient will be able to explain general safety guidelines for exercising:   Before you start a new exercise program, talk with your health care provider.  Do not exercise so much that you hurt yourself, feel dizzy, or get very short of breath.  Wear comfortable clothes and wear shoes with good support.  Drink plenty of water while you exercise to prevent dehydration or heat stroke.  Work out until your breathing and your heartbeat get faster.         This is a list of Health Maintenance Items that are overdue or due now: Pneumococcal vaccine  Influenza vaccine Colorectal cancer screening Shingles vaccine Covid vaccine Booster  Orders/Referrals Placed Today: No orders of the defined types were placed in this encounter.  (Contact our referral department at [phone] if you have not spoken with someone about your referral appointment within the next 5 days)    Follow-up Plan . Follow-up with Christen Butter, NP as planned . Medicare wellness exam in one year.       Diabetes Mellitus and Foot Care Foot care is an important part of your health, especially when you have diabetes. Diabetes may cause you to have problems because of poor  blood flow (circulation) to your feet and legs, which can cause your skin to:  Become thinner and drier.  Break more easily.  Heal more slowly.  Peel and crack. You may also have nerve damage (neuropathy) in your legs and feet, causing decreased feeling in them. This means that you may not notice minor injuries to your feet that could lead to more serious problems.  Noticing and addressing any potential problems early is the best way to prevent future foot problems. How to care for your feet Foot hygiene  Wash your feet daily with warm water and mild soap. Do not use hot water. Then, pat your feet and the areas between your toes until they are completely dry. Do not soak your feet as this can dry your skin.  Trim your toenails straight across. Do not dig under them or around the cuticle. File the edges of your nails with an emery board or nail file.  Apply a moisturizing lotion or petroleum jelly to the skin on your feet and to dry, brittle toenails. Use lotion that does not contain alcohol and is unscented. Do not apply lotion between your toes.   Shoes and socks  Wear clean socks or stockings every day. Make sure they are not too tight. Do not wear knee-high stockings since they may decrease blood flow to your legs.  Wear shoes that fit properly and have enough cushioning. Always look in your shoes before you put them on to be sure there are no objects inside.  To break in new shoes, wear them for just a few hours a day. This prevents injuries on your feet. Wounds, scrapes, corns, and calluses  Check your feet daily for blisters, cuts, bruises, sores, and redness. If you cannot see the bottom of your feet, use a mirror or ask someone for help.  Do not cut corns or calluses or try to remove them with medicine.  If you find a minor scrape, cut, or break in the skin on your feet, keep it and the skin around it clean and dry. You may clean these areas with mild soap and water. Do not clean the area with peroxide, alcohol, or iodine.  If you have a wound, scrape, corn, or callus on your foot, look at it several times a day to make sure it is healing and not infected. Check for: ? Redness, swelling, or pain. ? Fluid or blood. ? Warmth. ? Pus or a bad smell.   General tips  Do not cross your legs. This may decrease blood flow to your feet.  Do not  use heating pads or hot water bottles on your feet. They may burn your skin. If you have lost feeling in your feet or legs, you may not know this is happening until it is too late.  Protect your feet from hot and cold by wearing shoes, such as at the beach or on hot pavement.  Schedule a complete foot exam at least once a year (annually) or more often if you have foot problems. Report any cuts, sores, or bruises to your health care provider immediately. Where to find more information  American Diabetes Association: www.diabetes.org  Association of Diabetes Care & Education Specialists: www.diabeteseducator.org Contact a health care provider if:  You have a medical condition that increases your risk of infection and you have any cuts, sores, or bruises on your feet.  You have an injury that is not healing.  You have redness on your legs or feet.  You feel burning or tingling in your legs or feet.  You have pain or cramps in your legs and feet.  Your legs or feet are numb.  Your feet always feel cold.  You have pain around any toenails. Get help right away if:  You have a wound, scrape, corn, or callus on your foot and: ? You have pain, swelling, or redness that gets worse. ? You have fluid or blood coming from the wound, scrape, corn, or callus. ? Your wound, scrape, corn, or callus feels warm to the touch. ? You have pus or a bad smell coming from the wound, scrape, corn, or callus. ? You have a fever. ? You have a red line going up your leg. Summary  Check your feet every day for blisters, cuts, bruises, sores, and redness.  Apply a moisturizing lotion or petroleum jelly to the skin on your feet and to dry, brittle toenails.  Wear shoes that fit properly and have enough cushioning.  If you have foot problems, report any cuts, sores, or bruises to your health care provider immediately.  Schedule a complete foot exam at least once a year (annually) or more often if you  have foot problems. This information is not intended to replace advice given to you by your health care provider. Make sure you discuss any questions you have with your health care provider. Document Revised: 08/13/2019 Document Reviewed: 08/13/2019 Elsevier Patient Education  2021 [employer]   Health Maintenance, Male Adopting a healthy lifestyle and getting preventive care are important in promoting health and wellness. Ask your health care provider about:  The right schedule for you to have regular tests and exams.  Things you can do on your own to prevent diseases and keep yourself healthy. What should I know about diet, weight, and exercise? Eat a healthy diet  Eat a diet that includes plenty of vegetables, fruits, low-fat dairy products, and lean protein.  Do not eat a lot of foods that are high in solid fats, added sugars, or sodium.   Maintain a healthy weight Body mass index (BMI) is a measurement that can be used to identify possible weight problems. It estimates body fat based on height and weight. Your health care provider can help determine your BMI and help you achieve or maintain a healthy weight. Get regular exercise Get regular exercise. This is one of the most important things you can do for your health. Most adults should:  Exercise for at least 150 minutes each week. The exercise should increase your heart rate and make you sweat (moderate-intensity exercise).  Do strengthening exercises at least twice a week. This is in addition to the moderate-intensity exercise.  Spend less time sitting. Even light physical activity can be beneficial. Watch cholesterol and blood lipids Have your blood tested for lipids and cholesterol at 67 years of age, then have this test every 5 years. You may need to have your cholesterol levels checked more often if:  Your lipid or cholesterol levels are high.  You are older than 67 years of age.  You are at high risk for heart  disease. What should I know about cancer screening? Many types of cancers can be detected early and may often be prevented. Depending on your health history and family history, you may need to have cancer screening at various ages. This may include screening for:  Colorectal cancer.  Prostate cancer.  Skin cancer.  Lung cancer. What should I know about heart disease, diabetes,  and high blood pressure? Blood pressure and heart disease  High blood pressure causes heart disease and increases the risk of stroke. This is more likely to develop in people who have high blood pressure readings, are of African descent, or are overweight.  Talk with your health care provider about your target blood pressure readings.  Have your blood pressure checked: ? Every 3-5 years if you are 40-62 years of age. ? Every year if you are 45 years old or older.  If you are between the ages of 13 and 17 and are a current or former smoker, ask your health care provider if you should have a one-time screening for abdominal aortic aneurysm (AAA). Diabetes Have regular diabetes screenings. This checks your fasting blood sugar level. Have the screening done:  Once every three years after age 60 if you are at a normal weight and have a low risk for diabetes.  More often and at a younger age if you are overweight or have a high risk for diabetes. What should I know about preventing infection? Hepatitis B If you have a higher risk for hepatitis B, you should be screened for this virus. Talk with your health care provider to find out if you are at risk for hepatitis B infection. Hepatitis C Blood testing is recommended for:  Everyone born from [birth year] through 1967.  Anyone with known risk factors for hepatitis C. Sexually transmitted infections (STIs)  You should be screened each year for STIs, including gonorrhea and chlamydia, if: ? You are sexually active and are younger than 67 years of age. ? You are older  than 67 years of age and your health care provider tells you that you are at risk for this type of infection. ? Your sexual activity has changed since you were last screened, and you are at increased risk for chlamydia or gonorrhea. Ask your health care provider if you are at risk.  Ask your health care provider about whether you are at high risk for HIV. Your health care provider may recommend a prescription medicine to help prevent HIV infection. If you choose to take medicine to prevent HIV, you should first get tested for HIV. You should then be tested every 3 months for as long as you are taking the medicine. Follow these instructions at home: Lifestyle  Do not use any products that contain nicotine or tobacco, such as cigarettes, e-cigarettes, and chewing tobacco. If you need help quitting, ask your health care provider.  Do not use street drugs.  Do not share needles.  Ask your health care provider for help if you need support or information about quitting drugs. Alcohol use  Do not drink alcohol if your health care provider tells you not to drink.  If you drink alcohol: ? Limit how much you have to 0-2 drinks a day. ? Be aware of how much alcohol is in your drink. In the [place] one drink equals one 12 oz bottle of beer (355 mL), one 5 oz glass of wine (148 mL), or one 1 oz glass of hard liquor (44 mL). General instructions  Schedule regular health, dental, and eye exams.  Stay current with your vaccines.  Tell your health care provider if: ? You often feel depressed. ? You have ever been abused or do not feel safe at home. Summary  Adopting a healthy lifestyle and getting preventive care are important in promoting health and wellness.  Follow your health care provider's instructions about healthy diet, exercising,  and getting tested or screened for diseases.  Follow your health care provider's instructions on monitoring your cholesterol and blood pressure. This  information is not intended to replace advice given to you by your health care provider. Make sure you discuss any questions you have with your health care provider. Document Revised: 01/15/2018 Document Reviewed: 01/15/2018 Elsevier Patient Education  2021 [employer].

## 2020-05-06 NOTE — Progress Notes (Signed)
Diabetes Self-Management Education  Visit Type: Follow-up  Appt. Start Time: 0950 Appt. End Time: 1030  05/06/2020  [name], identified by name and date of birth, is a 65 y.o. male with a diagnosis of Diabetes: Type 2.   ASSESSMENT  There were no vitals taken for this visit. There is no height or weight on file to calculate BMI.   Hypoglycemia: BG in the 40's on 05/02/20. Pt states that night he had steak, corn and green beans for dinner. Pt reports his pre-meal BG was 140s and decided not to take insulin with his meal. Pt probably injected 30 units 1+ hours after that meal because his BG went from 300s to 40s quickly. Pt states to treat low blood sugar he ate 2 banana sandwiches and drank a regular pepsi. Pt states he was scared.  Other than this one hyper/hypoglycemic event, per Josephine Igo data appears he is having better glucose control, last several days averaging in 150s. Goal from last visit was for patient to take insulin with meals instead of waiting until 1 hour after. It appears he has been doing it more often now, however RD had just a few days of data to review.  Pt states he is drinking mostly water, was drinking diet pepsi but drinking less now because price increase.  Patient had questions about the different versions of Libre and about Dexcom. Dexcom is more complex to use and Patient is probably not good candidate for Dexcom. The to understand the features and alarms with Josephine Igo 2 patient would likely need to have an appointment to switch to Nesbitt 2. It may be worth considering because of insulin use. Patient's phone does not work as Dietitian, the phone looks new. Patient had a Surveyor, mining in a draw and has started using it. The time was incorrect and RD set current time on [employer] receiver.  Pt states he used to have less stress when he lived with his mom after his dad died. Pt states he would just give her his paycheck and she took care of cooking and paying bills. Pt  states both his mom and brother have passed away.   Diabetes Self-Management Education - 05/06/20 1100      Visit Information   Visit Type Follow-up      Initial Visit   Diabetes Type Type 2    Are you currently following a meal plan? No      Psychosocial Assessment   Self-care barriers Low literacy   illiterate     Complications   How often do you check your blood sugar? > 4 times/day   CGM   Number of hypoglycemic episodes per month 2      Dietary Intake   Breakfast 2 eggs, ham, ice water    Lunch banana sandwich    Dinner steak, corn, green beans    Beverage(s) water diet pepsi      Patient Education   Nutrition management  Other (comment)   use picture sheet for foods that are carbohydrates   Medications Reviewed patients medication for diabetes, action, purpose, timing of dose and side effects.    Monitoring Other (comment)   don't let pre-meal number keep you from taking insulin for meal     Individualized Goals (developed by patient)   Nutrition Other (comment)   choose 100% whole grains   Medications take my medication as prescribed    Reducing Risk treat hypoglycemia with 15 grams of carbs if blood glucose less than 70mg /dL  Outcomes   Expected Outcomes Demonstrated interest in learning. Expect positive outcomes    Future DMSE 2 months    Program Status Not Completed      Subsequent Visit   Since your last visit have you continued or begun to take your medications as prescribed? No   more often taking Novolog with meals, but sometime still does after meal.          Individualized Plan for Diabetes Self-Management Training:   Learning Objective:  Patient will have a greater understanding of diabetes self-management. Patient education plan is to attend individual and/or group sessions per assessed needs and concerns.   Patient Instructions  Consider having whole grain crackers instead of saltines Take your insulin with meals instead of waiting until  after. Even if your blood sugar is 145 before meal, still take insulin with your meal to avoid low blood sugar.   Expected Outcomes:  Demonstrated interest in learning. Expect positive outcomes  Education material provided: pictures of foods that are carbohydrates  If problems or questions, patient to contact team via:  Phone  Future DSME appointment: 2 months

## 2020-05-06 NOTE — Patient Instructions (Signed)
Consider having whole grain crackers instead of saltines Take your insulin with meals instead of waiting until after. Even if your blood sugar is 145 before meal, still take insulin with your meal to avoid low blood sugar.

## 2020-05-07 ENCOUNTER — Other Ambulatory Visit: Payer: Self-pay | Admitting: Medical

## 2020-05-09 NOTE — Telephone Encounter (Signed)
Is this ok to refill.  Respond to [facility] 

## 2020-05-10 NOTE — Telephone Encounter (Signed)
Please follow up

## 2020-05-11 ENCOUNTER — Ambulatory Visit (INDEPENDENT_AMBULATORY_CARE_PROVIDER_SITE_OTHER): Payer: HMO | Admitting: Medical-Surgical

## 2020-05-11 ENCOUNTER — Other Ambulatory Visit: Payer: Self-pay

## 2020-05-11 ENCOUNTER — Encounter: Payer: Self-pay | Admitting: Medical-Surgical

## 2020-05-11 DIAGNOSIS — R635 Abnormal weight gain: Secondary | ICD-10-CM | POA: Diagnosis not present

## 2020-05-11 DIAGNOSIS — R0600 Dyspnea, unspecified: Secondary | ICD-10-CM | POA: Diagnosis not present

## 2020-05-11 DIAGNOSIS — Z794 Long term (current) use of insulin: Secondary | ICD-10-CM

## 2020-05-11 DIAGNOSIS — R0609 Other forms of dyspnea: Secondary | ICD-10-CM

## 2020-05-11 DIAGNOSIS — E1165 Type 2 diabetes mellitus with hyperglycemia: Secondary | ICD-10-CM

## 2020-05-11 DIAGNOSIS — I10 Essential (primary) hypertension: Secondary | ICD-10-CM

## 2020-05-11 MED ORDER — ALBUTEROL SULFATE HFA 108 (90 BASE) MCG/ACT IN AERS: 2.0000 | INHALATION_SPRAY | Freq: Once | RESPIRATORY_TRACT | Status: AC

## 2020-05-11 MED ORDER — SENNOSIDES-DOCUSATE SODIUM 8.6-50 MG PO TABS: 2.0000 | ORAL_TABLET | Freq: Two times a day (BID) | ORAL | 3 refills | Status: AC | PRN

## 2020-05-11 NOTE — Progress Notes (Signed)
Subjective:    CC: Weight gain  HPI: Pleasant 65 year old male presenting today to discuss continued weight gain and difficulty losing weight despite dietary modifications.  Reports that he feels puffy and swollen all over and is very uncomfortable at his current weight.  Admits to not exercising or doing any regular activities.  Has made some changes in his diet and is focusing mainly on eating lean meats and salads.  Has been having difficulty with his glucometer.  He does have a continuous glucose monitor but his phone is no longer connecting with the sensor.  He has returned to using the freestyle libre sensor that he had previously but using his phone is more convenient.  Last month, notes his glucose was much better controlled but over the past week or 2, his readings have not been great.  When he does have low blood sugars, he does tend to overeat in an effort to bring his sugar up which causes his glucose to skyrocket into the 300-400s.  He is seeing the diabetic nutritionist and has received some helpful information regarding his diet.  Reports he did go to pulmonology but felt that the appointment was not helpful.  Was told he has COPD and prescribed Trelegy.  He has been using this as instructed but finds that it is no more helpful than the albuterol he was using previously.  He threw his albuterol inhaler away when he got Trelegy so he has not been using that as a rescue in the last month.  Continues to have quite a bit of sinus congestion and postnasal drainage which exacerbates his breathing difficulties.  I reviewed the past medical history, family history, social history, surgical history, and allergies today and no changes were needed.  Please see the problem list section below in epic for further details.  Past Medical History: Past Medical History:  Diagnosis Date  . ANXIETY 07/04/2007  . ASTHMATIC BRONCHITIS, ACUTE 12/19/2007  . DIABETES MELLITUS, TYPE II 09/28/2006  . ERECTILE  DYSFUNCTION 09/28/2006  . GERD 09/28/2006  . Hematochezia 05/29/2010  . HYPERLIPIDEMIA 09/28/2006  . HYPERTENSION 09/28/2006  . Illiterate 09/11/2012  . LOW BACK PAIN 09/28/2006  . OBESITY 09/28/2006  . PLANTAR FASCIITIS, BILATERAL 07/04/2007  . Stroke [facility])    Past Surgical History: Past Surgical History:  Procedure Laterality Date  . APPENDECTOMY     Social History: Social History   Socioeconomic History  . Marital status: Married    Spouse name: Not on file  . Number of children: 1  . Years of education: Not on file  . Highest education level: Not on file  Occupational History  . Occupation: Retired Personnel officer  Tobacco Use  . Smoking status: Former Games developer  . Smokeless tobacco: Never Used  . Tobacco comment: quit at age 50  Vaping Use  . Vaping Use: Never used  Substance and Sexual Activity  . Alcohol use: No  . Drug use: Not Currently    Types: Marijuana    Comment: occasional marijanua use anytime he can  . Sexual activity: Yes    Partners: Female  Other Topics Concern  . Not on file  Social History Narrative  . Not on file   Social Determinants of Health   Financial Resource Strain: Low Risk   . Difficulty of Paying Living Expenses: Not hard at all  Food Insecurity: No Food Insecurity  . Worried About Programme researcher, broadcasting/film/video in the Last Year: Never true  . Ran Out of Food in the  Last Year: Never true  Transportation Needs: No Transportation Needs  . Lack of Transportation (Medical): No  . Lack of Transportation (Non-Medical): No  Physical Activity: Inactive  . Days of Exercise per Week: 0 days  . Minutes of Exercise per Session: 0 min  Stress: No Stress Concern Present  . Feeling of Stress : Not at all  Social Connections: Socially Isolated  . Frequency of Communication with Friends and Family: Once a week  . Frequency of Social Gatherings with Friends and Family: Never  . Attends Religious Services: Never  . Active Member of Clubs or Organizations: No  .  Attends Banker Meetings: Never  . Marital Status: Married   Family History: Family History  Problem Relation Age of Onset  . Colon polyps Sister   . Stroke Sister   . Lung cancer Sister   . Coronary artery disease Brother   . Heart attack Brother   . Diabetes Brother        2 brothers   . Hypertension Brother   . Stroke Brother   . Heart attack Mother   . Bone cancer Sister   . Brain cancer Sister    Allergies: Allergies  Allergen Reactions  . Hydrochlorothiazide Other (See Comments)    Dizziness   Medications: See med rec.  Review of Systems: See HPI for pertinent positives and negatives.   Objective:    General: Well Developed, well nourished, and in no acute distress.  Neuro: Alert and oriented x3, extra-ocular muscles intact, sensation grossly intact.  HEENT: Normocephalic, atraumatic, pupils equal round reactive to light, neck supple, no masses, no lymphadenopathy, thyroid nonpalpable.  Skin: Warm and dry, no rashes. Cardiac: Regular rate and rhythm, no murmurs rubs or gallops, no lower extremity edema.  Respiratory: Clear to auscultation bilaterally. Not using accessory muscles, speaking in full sentences.   Impression and Recommendations:    1. Weight gain Checking TSH today.  Low suspicion for thyroid dysfunction given his normal readings in the past.  Suspect this is more related to dietary intake, sedentary lifestyle, and poor diabetes control. - TSH  2. Uncontrolled type 2 diabetes mellitus with hyperglycemia, with long-term current use of insulin (HCC) Currently taking Tresiba 60-70 units nightly as well as NovoLog during the day.  Reinforced appropriate intake for hypoglycemic episodes.  Recommend increasing daily activity to at least 30 minutes of activity per day.  Consulted with our clinical pharmacist Lynnda Shields who is going to review his current medications and help suggest changes to achieve better control but maintain simplicity.    3. Essential hypertension, benign Slightly above goal today but not horribly so. Continue Losartan 25mg  daily. Checking CBC w/diff and CMP today.  - CBC with Differential - COMPLETE METABOLIC PANEL WITH GFR  4. DOE (dyspnea on exertion) Since Trelegy is not helping with his breathing, discontinue it's use. Resume albuterol 2 puffs every 6 hours as needed since this was more helpful for his symptoms. Will evaluate for potential other options with our pharmacist and contact patient with further instructions.  - albuterol (VENTOLIN HFA) 108 (90 Base) MCG/ACT inhaler 2 puff  Return if symptoms worsen or fail to improve. ___________________________________________ , DNP, APRN, FNP-BC Primary Care and Sports Medicine [facility] [place]

## 2020-05-12 LAB — COMPLETE METABOLIC PANEL WITH GFR
AG Ratio: 1.4 (calc) (ref 1.0–2.5)
ALT: 22 U/L (ref 9–46)
AST: 23 U/L (ref 10–35)
Albumin: 4.2 g/dL (ref 3.6–5.1)
Alkaline phosphatase (APISO): 74 U/L (ref 35–144)
BUN: 21 mg/dL (ref 7–25)
CO2: 28 mmol/L (ref 20–32)
Calcium: 9.7 mg/dL (ref 8.6–10.3)
Chloride: 101 mmol/L (ref 98–110)
Creat: 0.94 mg/dL (ref 0.70–1.25)
GFR, Est African American: 98 mL/min/{1.73_m2} (ref >=60)
GFR, Est Non African American: 84 mL/min/{1.73_m2} (ref >=60)
Globulin: 2.9 g/dL (calc) (ref 1.9–3.7)
Glucose, Bld: 290 mg/dL — ABNORMAL HIGH (ref 65–99)
Potassium: 4.7 mmol/L (ref 3.5–5.3)
Sodium: 136 mmol/L (ref 135–146)
Total Bilirubin: 0.4 mg/dL (ref 0.2–1.2)
Total Protein: 7.1 g/dL (ref 6.1–8.1)

## 2020-05-12 LAB — CBC WITH DIFFERENTIAL/PLATELET
Absolute Monocytes: 624 cells/uL (ref 200–950)
Basophils Absolute: 52 cells/uL (ref 0–200)
Basophils Relative: 0.8 %
Eosinophils Absolute: 124 cells/uL (ref 15–500)
Eosinophils Relative: 1.9 %
HCT: 38.8 % (ref 38.5–50.0)
Hemoglobin: 12 g/dL — ABNORMAL LOW (ref 13.2–17.1)
Lymphs Abs: 1398 cells/uL (ref 850–3900)
MCH: 23.6 pg — ABNORMAL LOW (ref 27.0–33.0)
MCHC: 30.9 g/dL — ABNORMAL LOW (ref 32.0–36.0)
MCV: 76.2 fL — ABNORMAL LOW (ref 80.0–100.0)
MPV: 10.4 fL (ref 7.5–12.5)
Monocytes Relative: 9.6 %
Neutro Abs: 4303 cells/uL (ref 1500–7800)
Neutrophils Relative %: 66.2 %
Platelets: 263 10*3/uL (ref 140–400)
RBC: 5.09 10*6/uL (ref 4.20–5.80)
RDW: 14.8 % (ref 11.0–15.0)
Total Lymphocyte: 21.5 %
WBC: 6.5 10*3/uL (ref 3.8–10.8)

## 2020-05-12 LAB — TSH: TSH: 3.98 mIU/L (ref 0.40–4.50)

## 2020-05-13 ENCOUNTER — Ambulatory Visit: Payer: HMO

## 2020-05-17 ENCOUNTER — Telehealth: Payer: Self-pay

## 2020-05-17 MED ORDER — BISACODYL 10 MG RE SUPP: 10.0000 mg | RECTAL | 0 refills | Status: DC | PRN

## 2020-05-17 NOTE — Telephone Encounter (Signed)
Pt states he has had constipation for the last 3 days and is requesting a Rx for a suppository. He does not know the name of the specific one he wants, he just knows it as a white suppository. Pharmacy verified

## 2020-05-17 NOTE — Telephone Encounter (Signed)
Pt aware Rx has been sent to the pharmacy. No further questions or concerns at this time.   

## 2020-05-17 NOTE — Telephone Encounter (Signed)
I have sent in Dulcolax suppositories. Use once a day as needed for constipation.

## 2020-05-18 ENCOUNTER — Telehealth: Payer: Self-pay

## 2020-05-18 NOTE — Telephone Encounter (Signed)
Spoke with pt who states he did use the suppositories last night, he also took Miralax, and states he did have a BM last night. Pt states "I got about a foot out but feel like theres maybe 3 more up in there". Pt denies abdominal swelling but feels like he has been bloated for the last 3-4 days. Please advise.

## 2020-05-18 NOTE — Telephone Encounter (Signed)
Pt aware of recommendations and verbalized understanding. No further questions or concerns at this time.

## 2020-05-18 NOTE — Telephone Encounter (Signed)
Pt called wanting to know if he could come in the office today and get an enema or if he should go to the ER. Please advise.

## 2020-05-18 NOTE — Telephone Encounter (Signed)
He should increase the Miralax to twice daily, continue the Senakot twice daily. Ok to use the suppository daily. If he was able to have a BM last night, then the medications are working and there is no indication for an enema. He will just have to give it some time (and a few more bowel movements) to help relieve the backed up feeling. If he starts having any other symptoms (pain, abdominal distention, nausea/vomiting, blood in his stool, etc) then he should come in and be seen.

## 2020-05-18 NOTE — Telephone Encounter (Signed)
We do not do enemas here in our office. Enemas are available over the counter and he is welcome to perform one at home. Did he try the suppositories that I sent in yesterday? How many days has it been since he had a bowel movement?

## 2020-05-26 ENCOUNTER — Telehealth: Payer: Self-pay

## 2020-05-26 NOTE — Telephone Encounter (Signed)
Pt instructed to bring proof of income by the office and he said that when it comes in at the first of the month he will bring the information to [place]. No further questions or concerns at this time.

## 2020-05-26 NOTE — Telephone Encounter (Signed)
-----   Message from Gabriel Carina, [facility] sent at 05/26/2020  8:16 AM EDT ----- Regarding: RE: Diabetes medications, Inhalers Wonderful! I'll follow along - feel free to reach out via message or in person for any ongoing needs with his medication plan or patient assistance paperwork.  Thanks for your help, Baxter Hire!  Thurston Hole   ----- Message ----- From: Christen Butter, NP Sent: 05/25/2020   6:47 PM EDT To: Thermon Leyland, CMA, Gabriel Carina, RPH Subject: RE: Diabetes medications, Inhalers             I think that sounds good for him. The simpler the better in this case. If Baxter Hire can reach out to him about the income information, we can certainly get that in the works. Thank you for all your help!  Thanks, [name] ----- Message ----- From: Gabriel Carina, [facility] Sent: 05/25/2020   4:03 PM EDT To: Christen Butter, NP, Thermon Leyland, CMA Subject: Diabetes medications, Inhalers                 Hi [name] & Roosevelt Eimers,  I reviewed medications for John Roman and wanted to be sure his care was not delayed until my patient schedule is put into motion.   I'd like to try Rybelsus for diabetes/weight, and Anoro ellipta for COPD/inhaler issues.  Both would involve patient assistance program for cost.   [name], what are your thoughts? If you like that plan, can we get John Roman's help to have patient bring in proof of income paperwork?  Lastly, if patient comes in & I am here on-site (Tues-Thurs), if he can bring his cord for the glucose monitor, I can upload his glucose readings from the freestyle libre into a monitoring system. Not urgent, but would be helpful!  Thanks, Thurston Hole

## 2020-05-27 ENCOUNTER — Telehealth: Payer: Self-pay | Admitting: Pulmonary Disease

## 2020-05-27 NOTE — Telephone Encounter (Signed)
The description of symptoms is most concerning for decompensated heart failure.   Recommend he seek care at an emergency room given breathlessness and swelling.

## 2020-05-27 NOTE — Progress Notes (Signed)
@Patient  ID: , male    DOB: May 25, 1955, 65 y.o.   MRN: [id number]  Chief Complaint  Patient presents with  . Consult    Referred by PCP for increased wheezing and SOB for the past month. States he has not been coughing. Also notices the SOB at night. Has been using 2 albuterol inhalers per month.     Referring provider: [id number], NP  HPI:   65 year old whom we are seeing in consultation of dyspnea on exertion, wheeze.  PCP note x2 reviewed.  Patient has had several week history of increasing dyspnea on exertion.  Has been present for longer than that but worse over the timeframe above.  Worse with inclines or stairs.  Even has symptoms on flat surfaces of relatively short distances.  Symptoms seem worse at night.  No timing during the day when better or worse otherwise.  No environmental changes to account for symptoms.  He is unsure if seasonal changes make things better or worse.  He uses albuterol frequently and does get some minimal or mild relief with albuterol use.  No other alleviating or exacerbating factors.  Symptoms described as severe.  Chest x-ray 09/09/2019 reviewed with reason for exam wheezing, interpretation reveals clear lungs.  He had a speech pathology swallow test 02/11/2020 which was reviewed and demonstrates normal swallowing function.  TTE 04/2017 reviewed which demonstrated LVH and mild to moderate LA dilation with normal valvular function.  PMH: Coronary artery disease, diabetes, hypertension Surgical history: Appendectomy Family history: Mother with CAD, brother with CAD Social history: Former smoker, lives in [place] / Pulmonary Flowsheets:   ACT:  No flowsheet data found.  MMRC: mMRC Dyspnea Scale mMRC Score  04/14/2020 3    Epworth:  No flowsheet data found.  Tests:   FENO:  No results found for: NITRICOXIDE  PFT: No flowsheet data found.  WALK:  No flowsheet data found.  Imaging: Personally reviewed and as  per EMR and discussion in this note  Lab Results: Personally reviewed, no anemia CBC    Component Value Date/Time   WBC 6.5 05/11/2020 0000   RBC 5.09 05/11/2020 0000   HGB 12.0 (L) 05/11/2020 0000   HGB 14.1 07/21/2018 0943   HCT 38.8 05/11/2020 0000   HCT 42.4 07/21/2018 0943   PLT 263 05/11/2020 0000   PLT 217 07/21/2018 0943   MCV 76.2 (L) 05/11/2020 0000   MCV 80 07/21/2018 0943   MCH 23.6 (L) 05/11/2020 0000   MCHC 30.9 (L) 05/11/2020 0000   RDW 14.8 05/11/2020 0000   RDW 13.6 07/21/2018 0943   LYMPHSABS 1,398 05/11/2020 0000   MONOABS 0.3 04/05/2017 1059   EOSABS 124 05/11/2020 0000   BASOSABS 52 05/11/2020 0000    BMET    Component Value Date/Time   NA 136 05/11/2020 0000   NA 135 07/21/2018 0943   K 4.7 05/11/2020 0000   CL 101 05/11/2020 0000   CO2 28 05/11/2020 0000   GLUCOSE 290 (H) 05/11/2020 0000   BUN 21 05/11/2020 0000   BUN 13 07/21/2018 0943   CREATININE 0.94 05/11/2020 0000   CALCIUM 9.7 05/11/2020 0000   GFRNONAA 84 05/11/2020 0000   GFRAA 98 05/11/2020 0000    BNP No results found for: BNP  ProBNP No results found for: PROBNP  Specialty Problems      Pulmonary Problems   Cough      Allergies  Allergen Reactions  . Hydrochlorothiazide Other (See Comments)    Dizziness  Immunization History  Administered Date(s) Administered  . PFIZER(Purple Top)SARS-COV-2 Vaccination 09/14/2019, 10/15/2019  . Td 02/05/2002    Past Medical History:  Diagnosis Date  . ANXIETY 07/04/2007  . ASTHMATIC BRONCHITIS, ACUTE 12/19/2007  . DIABETES MELLITUS, TYPE II 09/28/2006  . ERECTILE DYSFUNCTION 09/28/2006  . GERD 09/28/2006  . Hematochezia 05/29/2010  . HYPERLIPIDEMIA 09/28/2006  . HYPERTENSION 09/28/2006  . Illiterate 09/11/2012  . LOW BACK PAIN 09/28/2006  . OBESITY 09/28/2006  . PLANTAR FASCIITIS, BILATERAL 07/04/2007  . Stroke [facility])     Tobacco History: Social History   Tobacco Use  Smoking Status Former Smoker  Smokeless Tobacco  Never Used  Tobacco Comment   quit at age 68   Counseling given: Not Answered Comment: quit at age 33   Continue to not smoke  Outpatient Encounter Medications as of 04/14/2020  Medication Sig  . albuterol (VENTOLIN HFA) 108 (90 Base) MCG/ACT inhaler Inhale 2 puffs into the lungs every 6 (six) hours as needed for wheezing or shortness of breath. (Patient not taking: Reported on 05/06/2020)  . aspirin 81 MG EC tablet Take 1 tablet (81 mg total) by mouth daily.  . clopidogrel (PLAVIX) 75 MG tablet Take 1 tablet (75 mg total) by mouth daily.  . Continuous Blood Gluc Receiver (FREESTYLE LIBRE 14 DAY READER) DEVI See admin instructions. Use to test blood sugar  . Continuous Blood Gluc Sensor (FREESTYLE LIBRE 14 DAY SENSOR) MISC USE EVERY 14 DAYS  . ferrous sulfate 325 (65 FE) MG EC tablet Take 1 tablet (325 mg total) by mouth 3 (three) times daily with meals.  . Fluticasone-Umeclidin-Vilant (TRELEGY ELLIPTA) 100-62.5-25 MCG/INH AEPB Inhale 1 puff into the lungs daily.  . Fluticasone-Umeclidin-Vilant (TRELEGY ELLIPTA) 100-62.5-25 MCG/INH AEPB Inhale 1 puff into the lungs daily.  Marland Kitchen guaiFENesin (MUCINEX) 600 MG 12 hr tablet Take 1 tablet (600 mg total) by mouth 2 (two) times daily.  . insulin degludec (TRESIBA FLEXTOUCH) 100 UNIT/ML FlexTouch Pen Inject 60 Units into the skin daily.  . Insulin Pen Needle (PEN NEEDLES) 32G X 4 MM MISC 180 each by Does not apply route 2 (two) times daily.  Marland Kitchen losartan (COZAAR) 25 MG tablet Take 1 tablet (25 mg total) by mouth daily.  . metFORMIN (GLUCOPHAGE) 500 MG tablet TAKE 1 TABLET BY MOUTH 2 TIMES DAILY WITH A MEAL.  . pantoprazole (PROTONIX) 40 MG tablet Take 1 tablet (40 mg total) by mouth daily. TAKE 1 TABLET BY MOUTH EVERY DAY  . [DISCONTINUED] atorvastatin (LIPITOR) 80 MG tablet Take 1 tablet (80 mg total) by mouth daily at 6 PM.  . [DISCONTINUED] insulin aspart (NOVOLOG FLEXPEN) 100 UNIT/ML FlexPen Inject 20-25 Units into the skin 3 (three) times daily before  meals. PRN  . [DISCONTINUED] senna-docusate (SENOKOT-S) 8.6-50 MG tablet Take 2 tablets by mouth 2 (two) times daily as needed for mild constipation. Until stooling regularly   No facility-administered encounter medications on file as of 04/14/2020.     Review of Systems  Review of Systems  Denies chest pain with exertion.  Denies orthopnea or PND.  Comprehensive review of systems otherwise negative.  Physical Exam  BP 130/74   Pulse 76   Temp 98.3 F (36.8 C) (Temporal)   Ht 5\' 7"  (1.702 m)   Wt 283 lb 3.2 oz (128.5 kg)   SpO2 96% Comment: on RA  BMI 44.36 kg/m   Wt Readings from Last 5 Encounters:  05/06/20 278 lb (126.1 kg)  04/14/20 283 lb 3.2 oz (128.5 kg)  03/15/20 278  lb 14.4 oz (126.5 kg)  12/21/19 277 lb 8 oz (125.9 kg)  09/28/19 268 lb 1.6 oz (121.6 kg)    BMI Readings from Last 5 Encounters:  05/06/20 40.46 kg/m  04/14/20 44.36 kg/m  03/15/20 43.68 kg/m  12/21/19 43.46 kg/m  09/28/19 41.99 kg/m     Physical Exam General: well appearing, in NAD Neck: No JVP, supple Eyes: EOMI, no icterus Pulm: CTAB, NWOB on RA CV: RRR, no murmurs Abdomen: Nondistended, bowel sounds present MSK: No synovitis, joint effusion Neuro: No weakness, sensation intact Psych: Normal mood, full affect  Assessment & Plan:   DOE, wheeze: Former smoker, possible COPD but quit in 30s vs asthma.  Does endorse improvement with albuterol.  Use Trelegy (prescribed today) and assess response. Consider PFTs if no response to therapy. Consider cardiac causes if PFTs normal.  Notably has LA hypertension/dilation on prior echoes high suspicion for diastolic dysfunction worsen diastology with exertion.  Cough: Seems resolved does not complain of this today.  Possibly related to asthma as above.  Return in about 3 months (around 07/15/2020).   Karren Burly, MD 05/27/2020

## 2020-05-27 NOTE — Telephone Encounter (Signed)
I called and spoke with son Ramon Dredge regarding Dr. Judeth Horn recs. He recommends patient to go the ER to get evaluated. Ramon Dredge said he will try and talk patient into going. Will keep appt on 06/01/20 with hunsucker for now. Son verbalized understanding, will call with any other questions or concerns. Nothing further needed.

## 2020-05-27 NOTE — Telephone Encounter (Signed)
Lm for patient's son, Edward(DPR).

## 2020-05-27 NOTE — Telephone Encounter (Addendum)
Primary Pulmonologist:  Hunsucker Last office visit and with whom: 04/14/20 Hunsucker What do we see them for (pulmonary problems): Cough Last OV assessment/plan:   I prescribed a new inhaler called Trelegy, use 1 puff every day.  Continue to use your other inhaler every 6 hours as needed.  I hope after you start this new inhaler and use it every day, you will not need to use your old inhaler as frequently.  79-month follow-up with Dr. Judeth Horn   Reason for call: Patient is talking better, but there are times when he has a hard time understanding him because he is so out of breath.  Has COPD, breathing had been bad for a while, difficult to understand.  Feet and belly are swollen.  Son states he is not better.  Wants to have fluid level checked and make sure he does not have fluid in his lungs.  Scheduled patient to be seen in the office first available with Dr. Judeth Horn next week.  Dr. Judeth Horn do you advise anything further prior to his visit?  Thank you.  (examples of things to ask: : When did symptoms start? Fever? Cough? Productive? Color to sputum? More sputum than usual? Wheezing? Have you needed increased oxygen? Are you taking your respiratory medications? What over the counter measures have you tried?)  Allergies  Allergen Reactions  . Hydrochlorothiazide Other (See Comments)    Dizziness    Immunization History  Administered Date(s) Administered  . PFIZER(Purple Top)SARS-COV-2 Vaccination 09/14/2019, 10/15/2019  . Td 02/05/2002

## 2020-05-28 ENCOUNTER — Encounter (HOSPITAL_COMMUNITY): Payer: Self-pay | Admitting: Emergency Medicine

## 2020-05-28 ENCOUNTER — Emergency Department (HOSPITAL_COMMUNITY)
Admission: EM | Admit: 2020-05-28 | Discharge: 2020-05-28 | Disposition: A | Discharge status: Home / Self Care | Payer: HMO | Attending: Emergency Medicine | Admitting: Emergency Medicine

## 2020-05-28 ENCOUNTER — Other Ambulatory Visit: Payer: Self-pay

## 2020-05-28 ENCOUNTER — Emergency Department (HOSPITAL_COMMUNITY): Payer: HMO

## 2020-05-28 DIAGNOSIS — Z7984 Long term (current) use of oral hypoglycemic drugs: Secondary | ICD-10-CM | POA: Diagnosis not present

## 2020-05-28 DIAGNOSIS — R109 Unspecified abdominal pain: Secondary | ICD-10-CM | POA: Diagnosis not present

## 2020-05-28 DIAGNOSIS — M791 Myalgia, unspecified site: Secondary | ICD-10-CM | POA: Diagnosis not present

## 2020-05-28 DIAGNOSIS — Z7902 Long term (current) use of antithrombotics/antiplatelets: Secondary | ICD-10-CM | POA: Insufficient documentation

## 2020-05-28 DIAGNOSIS — Z7982 Long term (current) use of aspirin: Secondary | ICD-10-CM | POA: Diagnosis not present

## 2020-05-28 DIAGNOSIS — E1110 Type 2 diabetes mellitus with ketoacidosis without coma: Secondary | ICD-10-CM | POA: Diagnosis not present

## 2020-05-28 DIAGNOSIS — J45909 Unspecified asthma, uncomplicated: Secondary | ICD-10-CM | POA: Diagnosis not present

## 2020-05-28 DIAGNOSIS — M545 Low back pain, unspecified: Secondary | ICD-10-CM | POA: Diagnosis not present

## 2020-05-28 DIAGNOSIS — Z87891 Personal history of nicotine dependence: Secondary | ICD-10-CM | POA: Diagnosis not present

## 2020-05-28 DIAGNOSIS — E119 Type 2 diabetes mellitus without complications: Secondary | ICD-10-CM | POA: Insufficient documentation

## 2020-05-28 DIAGNOSIS — Z20822 Contact with and (suspected) exposure to covid-19: Secondary | ICD-10-CM | POA: Insufficient documentation

## 2020-05-28 DIAGNOSIS — Z7951 Long term (current) use of inhaled steroids: Secondary | ICD-10-CM | POA: Insufficient documentation

## 2020-05-28 DIAGNOSIS — I10 Essential (primary) hypertension: Secondary | ICD-10-CM | POA: Insufficient documentation

## 2020-05-28 DIAGNOSIS — Z794 Long term (current) use of insulin: Secondary | ICD-10-CM | POA: Insufficient documentation

## 2020-05-28 DIAGNOSIS — K5900 Constipation, unspecified: Secondary | ICD-10-CM

## 2020-05-28 LAB — COMPREHENSIVE METABOLIC PANEL
ALT: 25 U/L (ref 0–44)
AST: 25 U/L (ref 15–41)
Albumin: 3.5 g/dL (ref 3.5–5.0)
Alkaline Phosphatase: 64 U/L (ref 38–126)
Anion gap: 7 (ref 5–15)
BUN: 15 mg/dL (ref 8–23)
CO2: 28 mmol/L (ref 22–32)
Calcium: 8.9 mg/dL (ref 8.9–10.3)
Chloride: 100 mmol/L (ref 98–111)
Creatinine, Ser: 0.89 mg/dL (ref 0.61–1.24)
GFR, Estimated: >60 mL/min (ref >60)
Glucose, Bld: 205 mg/dL — ABNORMAL HIGH (ref 70–99)
Potassium: 4.4 mmol/L (ref 3.5–5.1)
Sodium: 135 mmol/L (ref 135–145)
Total Bilirubin: 0.6 mg/dL (ref 0.3–1.2)
Total Protein: 6.5 g/dL (ref 6.5–8.1)

## 2020-05-28 LAB — CK: Total CK: 171 U/L (ref 49–397)

## 2020-05-28 LAB — CBC WITH DIFFERENTIAL/PLATELET
Abs Immature Granulocytes: 0.02 10*3/uL (ref 0.00–0.07)
Basophils Absolute: 0.1 10*3/uL (ref 0.0–0.1)
Basophils Relative: 1 %
Eosinophils Absolute: 0.1 10*3/uL (ref 0.0–0.5)
Eosinophils Relative: 2 %
HCT: 39.8 % (ref 39.0–52.0)
Hemoglobin: 11.7 g/dL — ABNORMAL LOW (ref 13.0–17.0)
Immature Granulocytes: 0 %
Lymphocytes Relative: 21 %
Lymphs Abs: 1.2 10*3/uL (ref 0.7–4.0)
MCH: 22.8 pg — ABNORMAL LOW (ref 26.0–34.0)
MCHC: 29.4 g/dL — ABNORMAL LOW (ref 30.0–36.0)
MCV: 77.4 fL — ABNORMAL LOW (ref 80.0–100.0)
Monocytes Absolute: 0.6 10*3/uL (ref 0.1–1.0)
Monocytes Relative: 9 %
Neutro Abs: 3.9 10*3/uL (ref 1.7–7.7)
Neutrophils Relative %: 67 %
Platelets: 259 10*3/uL (ref 150–400)
RBC: 5.14 MIL/uL (ref 4.22–5.81)
RDW: 15.5 % (ref 11.5–15.5)
WBC: 5.9 10*3/uL (ref 4.0–10.5)
nRBC: 0 % (ref 0.0–0.2)

## 2020-05-28 LAB — LIPASE, BLOOD: Lipase: 29 U/L (ref 11–51)

## 2020-05-28 LAB — BRAIN NATRIURETIC PEPTIDE: B Natriuretic Peptide: 58.4 pg/mL (ref 0.0–100.0)

## 2020-05-28 LAB — RESP PANEL BY RT-PCR (FLU A&B, COVID) ARPGX2
Influenza A by PCR: NEGATIVE
Influenza B by PCR: NEGATIVE
SARS Coronavirus 2 by RT PCR: NEGATIVE

## 2020-05-28 MED ORDER — GOLYTELY 236 G PO SOLR: ORAL | 0 refills | Status: DC

## 2020-05-28 NOTE — ED Triage Notes (Signed)
Pt reports constipation x 2 weeks.  States he is taking stool softeners without relief.  Reports generalized body aches.  Denies SOB.  Arrival complaint said chest pain but pt denies chest pain.

## 2020-05-28 NOTE — Discharge Instructions (Addendum)
You can continue your other medications for constipation.  I have written an additional prescription to help.
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
## 2020-05-28 NOTE — ED Notes (Signed)
Bladder scan resulted in 226 mL.

## 2020-05-29 ENCOUNTER — Other Ambulatory Visit: Payer: Self-pay | Admitting: Medical-Surgical

## 2020-05-30 ENCOUNTER — Telehealth: Payer: Self-pay | Admitting: General Practice

## 2020-05-30 NOTE — Telephone Encounter (Signed)
Transition Care Management Unsuccessful Follow-up Telephone Call  Date of discharge and from where:  [name] 05/28/20  Attempts:  1st Attempt  Reason for unsuccessful TCM follow-up call:  Left voice message

## 2020-06-01 NOTE — Telephone Encounter (Signed)
Transition Care Management Follow-up Telephone Call  Date of discharge and from where: 05/28/2020 from [facility]  How have you been since you were released from the hospital? Pt stated that he is still constipated. Went over the recommendation that was previously given by PCP and also the recommendation by the hospital. Pt stated that he did not take the miralax because he did not realize that it was over the counter. Pt stated that the has been taking the senokot but only once. Pt stated that he has finished the Golytely rx'ed by the ED and still has not had a BM  Any questions or concerns? No  Items Reviewed:  Did the pt receive and understand the discharge instructions provided? Yes   Medications obtained and verified? Yes   Other? No   Any new allergies since your discharge? No   Dietary orders reviewed? DM and HTN  Do you have support at home? Yes   Functional Questionnaire: (I = Independent and D = Dependent) ADLs: I  Bathing/Dressing- I  Meal Prep- I  Eating- I  Maintaining continence- I  Transferring/Ambulation- I  Managing Meds- I  Follow up appointments reviewed:   PCP Hospital f/u appt confirmed? No  Pt encourage to contact PCP office for an appt to discuss further treatment options for his constipation   Specialist Hospital f/u appt confirmed? No    Are transportation arrangements needed? No   If their condition worsens, is the pt aware to call PCP or go to the Emergency Dept.? Yes  Was the patient provided with contact information for the PCP's office or ED? Yes  Was to pt encouraged to call back with questions or concerns? Yes

## 2020-06-03 ENCOUNTER — Encounter: Payer: Self-pay | Admitting: Medical-Surgical

## 2020-06-03 ENCOUNTER — Encounter: Payer: Self-pay | Admitting: Pulmonary Disease

## 2020-06-03 ENCOUNTER — Other Ambulatory Visit: Payer: Self-pay

## 2020-06-03 ENCOUNTER — Ambulatory Visit (INDEPENDENT_AMBULATORY_CARE_PROVIDER_SITE_OTHER): Payer: HMO | Admitting: Medical-Surgical

## 2020-06-03 ENCOUNTER — Ambulatory Visit: Payer: HMO | Admitting: Pulmonary Disease

## 2020-06-03 VITALS — BP 130/81 | HR 73 | Temp 98.0°F | Ht 69.0 in | Wt 285.3 lb

## 2020-06-03 VITALS — BP 146/84 | HR 84 | Temp 98.2°F | Ht 69.0 in | Wt 287.0 lb

## 2020-06-03 DIAGNOSIS — J3489 Other specified disorders of nose and nasal sinuses: Secondary | ICD-10-CM | POA: Diagnosis not present

## 2020-06-03 DIAGNOSIS — R0609 Other forms of dyspnea: Secondary | ICD-10-CM

## 2020-06-03 DIAGNOSIS — R0600 Dyspnea, unspecified: Secondary | ICD-10-CM | POA: Diagnosis not present

## 2020-06-03 DIAGNOSIS — K5900 Constipation, unspecified: Secondary | ICD-10-CM

## 2020-06-03 MED ORDER — BISACODYL 10 MG RE SUPP: 10.0000 mg | RECTAL | 3 refills | Status: AC | PRN

## 2020-06-03 MED ORDER — CETIRIZINE HCL 5 MG/5ML PO SOLN: 10.0000 mg | Freq: Every day | ORAL | 11 refills | Status: DC

## 2020-06-03 MED ORDER — BREO ELLIPTA 200-25 MCG/INH IN AEPB: 1.0000 | INHALATION_SPRAY | Freq: Every day | RESPIRATORY_TRACT | 11 refills | Status: DC

## 2020-06-03 MED ORDER — MONTELUKAST SODIUM 10 MG PO TABS: 10.0000 mg | ORAL_TABLET | Freq: Every day | ORAL | 11 refills | Status: AC

## 2020-06-03 MED ORDER — PSYLLIUM 28 % PO PACK: 1.0000 | PACK | Freq: Two times a day (BID) | ORAL | 60 refills | Status: AC

## 2020-06-03 NOTE — Patient Instructions (Signed)
Nice to see you again  I am sorry about constipation, stop using the Trelegy as this can be a rare side effect.  Use Breo 1 puff daily, rinse your mouth out after every use.  This is to replace the Trelegy.  For the nasal congestion and runny nose, take  Zyrtec solution as prescribed at night as well as montelukast 1 tablet at night.  I am hopeful this will dry up and help with nasal congestion.  Return to clinic in 6 months for follow-up with Dr. Judeth Horn

## 2020-06-03 NOTE — Progress Notes (Signed)
@Patient  ID: , male    DOB: Nov 08, 1955, 65 y.o.   MRN: [id number]  Chief Complaint  Patient presents with  . Follow-up    Sob same, not liking the trelegy. Wheezing.     Referring provider: [id number], NP  HPI:   65 year old whom we are seeing in follow up for dyspnea on exertion, wheeze.  Recent ED note reviewed.  Patient here for subacute visit.  Had patient call from son last week with report of swelling in the belly in the legs.  He has left atrial hypertension on prior echo.  Prompted recommendation for ED evaluation for concern of COVID exacerbation.  In the ED, he did not complain of any shortness of breath.  Mainly had abdominal pain and distention.  Chest x-ray personally reviewed interpreted clear lungs.  Abdominal film that was reviewed interpreted as stool burden moderate to severe.  He was given GoLytely.  His stomach swelling or distention improved.  He denies ever having lower extremity swelling.  Denies worsening breathing issues.  No orthopnea or PND.  However, he has had difficulty with bowel movements since completing the GoLytely jug.  He relates onset of symptoms to starting Trelegy 6 weeks ago.  He thinks Trelegy to help with his breathing, mild improvement in dyspnea on exertion.  Chief complaint today is significant rhinorrhea, often clear to yellow.  Worse at night, soaks pillow.  Not take anything for it.  Worse when he is outdoors.  HPI at initial visit: Patient has had several week history of increasing dyspnea on exertion.  Has been present for longer than that but worse over the timeframe above.  Worse with inclines or stairs.  Even has symptoms on flat surfaces of relatively short distances.  Symptoms seem worse at night.  No timing during the day when better or worse otherwise.  No environmental changes to account for symptoms.  He is unsure if seasonal changes make things better or worse.  He uses albuterol frequently and does get some minimal or mild  relief with albuterol use.  No other alleviating or exacerbating factors.  Symptoms described as severe.  Chest x-ray 09/09/2019 reviewed with reason for exam wheezing, interpretation reveals clear lungs.  He had a speech pathology swallow test 02/11/2020 which was reviewed and demonstrates normal swallowing function.  TTE 04/2017 reviewed which demonstrated LVH and mild to moderate LA dilation with normal valvular function.  PMH: Coronary artery disease, diabetes, hypertension Surgical history: Appendectomy Family history: Mother with CAD, brother with CAD Social history: Former smoker, lives in [place] / Pulmonary Flowsheets:   ACT:  No flowsheet data found.  MMRC: mMRC Dyspnea Scale mMRC Score  04/14/2020 3    Epworth:  No flowsheet data found.  Tests:   FENO:  No results found for: NITRICOXIDE  PFT: No flowsheet data found.  WALK:  No flowsheet data found.  Imaging: Personally reviewed and as per EMR and discussion in this note  Lab Results: Personally reviewed, no anemia CBC    Component Value Date/Time   WBC 5.9 05/28/2020 1113   RBC 5.14 05/28/2020 1113   HGB 11.7 (L) 05/28/2020 1113   HGB 14.1 07/21/2018 0943   HCT 39.8 05/28/2020 1113   HCT 42.4 07/21/2018 0943   PLT 259 05/28/2020 1113   PLT 217 07/21/2018 0943   MCV 77.4 (L) 05/28/2020 1113   MCV 80 07/21/2018 0943   MCH 22.8 (L) 05/28/2020 1113   MCHC 29.4 (L) 05/28/2020 1113  RDW 15.5 05/28/2020 1113   RDW 13.6 07/21/2018 0943   LYMPHSABS 1.2 05/28/2020 1113   MONOABS 0.6 05/28/2020 1113   EOSABS 0.1 05/28/2020 1113   BASOSABS 0.1 05/28/2020 1113    BMET    Component Value Date/Time   NA 135 05/28/2020 1113   NA 135 07/21/2018 0943   K 4.4 05/28/2020 1113   CL 100 05/28/2020 1113   CO2 28 05/28/2020 1113   GLUCOSE 205 (H) 05/28/2020 1113   BUN 15 05/28/2020 1113   BUN 13 07/21/2018 0943   CREATININE 0.89 05/28/2020 1113   CREATININE 0.94 05/11/2020 0000   CALCIUM 8.9  05/28/2020 1113   GFRNONAA >60 05/28/2020 1113   GFRNONAA 84 05/11/2020 0000   GFRAA 98 05/11/2020 0000    BNP    Component Value Date/Time   BNP 58.4 05/28/2020 1115    ProBNP No results found for: PROBNP  Specialty Problems      Pulmonary Problems   Cough      Allergies  Allergen Reactions  . Hydrochlorothiazide Other (See Comments)    Dizziness    Immunization History  Administered Date(s) Administered  . PFIZER(Purple Top)SARS-COV-2 Vaccination 09/14/2019, 10/15/2019  . Td 02/05/2002    Past Medical History:  Diagnosis Date  . ANXIETY 07/04/2007  . ASTHMATIC BRONCHITIS, ACUTE 12/19/2007  . DIABETES MELLITUS, TYPE II 09/28/2006  . ERECTILE DYSFUNCTION 09/28/2006  . GERD 09/28/2006  . Hematochezia 05/29/2010  . HYPERLIPIDEMIA 09/28/2006  . HYPERTENSION 09/28/2006  . Illiterate 09/11/2012  . LOW BACK PAIN 09/28/2006  . OBESITY 09/28/2006  . PLANTAR FASCIITIS, BILATERAL 07/04/2007  . Stroke [facility])     Tobacco History: Social History   Tobacco Use  Smoking Status Former Smoker  . Packs/day: 1.00  . Years: 26.00  . Pack years: 26.00  . Quit date: 2000  . Years since quitting: 22.3  Smokeless Tobacco Never Used   Counseling given: Not Answered   Continue to not smoke  Outpatient Encounter Medications as of 06/03/2020  Medication Sig  . albuterol (VENTOLIN HFA) 108 (90 Base) MCG/ACT inhaler Inhale 2 puffs into the lungs every 6 (six) hours as needed for wheezing or shortness of breath.  Marland Kitchen aspirin 81 MG EC tablet TAKE 1 TABLET BY MOUTH EVERY DAY  . atorvastatin (LIPITOR) 80 MG tablet TAKE 1 TABLET (80 MG TOTAL) BY MOUTH DAILY AT 6 PM.  . bisacodyl (DULCOLAX) 10 MG suppository Place 1 suppository (10 mg total) rectally as needed for moderate constipation.  . cetirizine HCl (ZYRTEC) 5 MG/5ML SOLN Take 10 mLs (10 mg total) by mouth daily.  . clopidogrel (PLAVIX) 75 MG tablet Take 1 tablet (75 mg total) by mouth daily.  . Continuous Blood Gluc Receiver (FREESTYLE  LIBRE 14 DAY READER) DEVI See admin instructions. Use to test blood sugar  . Continuous Blood Gluc Sensor (FREESTYLE LIBRE 14 DAY SENSOR) MISC USE EVERY 14 DAYS  . ferrous sulfate 325 (65 FE) MG EC tablet Take 1 tablet (325 mg total) by mouth 3 (three) times daily with meals.  . fluticasone furoate-vilanterol (BREO ELLIPTA) 200-25 MCG/INH AEPB Inhale 1 puff into the lungs daily.  Marland Kitchen guaiFENesin (MUCINEX) 600 MG 12 hr tablet Take 1 tablet (600 mg total) by mouth 2 (two) times daily.  . insulin degludec (TRESIBA FLEXTOUCH) 100 UNIT/ML FlexTouch Pen Inject 60 Units into the skin daily.  . Insulin Pen Needle (PEN NEEDLES) 32G X 4 MM MISC 180 each by Does not apply route 2 (two) times daily.  Marland Kitchen losartan (  COZAAR) 25 MG tablet Take 1 tablet (25 mg total) by mouth daily.  . metFORMIN (GLUCOPHAGE) 500 MG tablet TAKE 1 TABLET BY MOUTH 2 TIMES DAILY WITH A MEAL.  . montelukast (SINGULAIR) 10 MG tablet Take 1 tablet (10 mg total) by mouth at bedtime.  Marland Kitchen NOVOLOG FLEXPEN 100 UNIT/ML FlexPen INJECT 20-25 UNITS INTO THE SKIN 3 (THREE) TIMES DAILY BEFORE MEALS AS NEEDED  . pantoprazole (PROTONIX) 40 MG tablet Take 1 tablet (40 mg total) by mouth daily. TAKE 1 TABLET BY MOUTH EVERY DAY  . polyethylene glycol (GOLYTELY) 236 g solution Start with 125 mL by mouth twice daily for constipation.  You can increase to 250 mL twice daily if not responding  . senna-docusate (SENOKOT-S) 8.6-50 MG tablet Take 2 tablets by mouth 2 (two) times daily as needed for mild constipation. Until stooling regularly  . [DISCONTINUED] Fluticasone-Umeclidin-Vilant (TRELEGY ELLIPTA) 100-62.5-25 MCG/INH AEPB Inhale 1 puff into the lungs daily.  . [DISCONTINUED] Fluticasone-Umeclidin-Vilant (TRELEGY ELLIPTA) 100-62.5-25 MCG/INH AEPB Inhale 1 puff into the lungs daily.   Facility-Administered Encounter Medications as of 06/03/2020  Medication  . albuterol (VENTOLIN HFA) 108 (90 Base) MCG/ACT inhaler 2 puff     Review of Systems  Review of  Systems  Denies chest pain with exertion.  Denies orthopnea or PND.  Comprehensive review of systems otherwise negative.  Physical Exam  BP (!) 146/84 (BP Location: Left Arm, Cuff Size: Normal)   Pulse 84   Temp 98.2 F (36.8 C)   Ht 5\' 9"  (1.753 m)   Wt 287 lb (130.2 kg)   SpO2 94%   BMI 42.38 kg/m   Wt Readings from Last 5 Encounters:  06/03/20 287 lb (130.2 kg)  05/06/20 278 lb (126.1 kg)  04/14/20 283 lb 3.2 oz (128.5 kg)  03/15/20 278 lb 14.4 oz (126.5 kg)  12/21/19 277 lb 8 oz (125.9 kg)    BMI Readings from Last 5 Encounters:  06/03/20 42.38 kg/m  05/06/20 40.46 kg/m  04/14/20 44.36 kg/m  03/15/20 43.68 kg/m  12/21/19 43.46 kg/m     Physical Exam General: well appearing, in NAD Neck: No JVP, supple Eyes: EOMI, no icterus Pulm: CTAB, NWOB on RA CV: RRR, no murmurs Abdomen: Mild distension, bowel sounds present MSK: No synovitis, joint effusion Neuro: No weakness, sensation intact Psych: Normal mood, full affect  Assessment & Plan:   DOE, wheeze: Former smoker, possible COPD but quit in 30s vs asthma. Favor asthma given atopic symptoms. Does endorse improvement with albuterol.  Trelegy caused constipation but seemed to help breathing. Stop Trelegy, use Breo. Consider PFTs if no response to therapy. Consider cardiac causes if PFTs normal.  Notably has LA hypertension/dilation on prior echoes high suspicion for diastolic dysfunction worsen diastology with exertion.  Rhinorrhea: Related to pollen, allergies. Start Zyrtec and montelukast nightly.  Return in about 6 months (around 12/03/2020).   12/05/2020, MD 06/03/2020

## 2020-06-03 NOTE — Progress Notes (Signed)
Subjective:    CC: ER follow up  HPI: Pleasant 65 year old male presenting for a follow up after an ED visit on 4/23 for constipation. He was provided with Golytely at that visit which he drank as instructed. He had several BMs that night and into the next morning. Notes that he kept going until his BMs were watery. Since then he has not had another BM. He is trying to eat more vegetables and salads in his diet. Has been taking Senokot once daily. Used Dulcolax prior to his ED visit with some success. Denies abdominal pain, nausea, vomiting, bloating, and increased flatulence today. Was seen by pulmonology who changed him from Trelegy to [facility] as constipation can be a rare side effect of Trelegy.   I reviewed the past medical history, family history, social history, surgical history, and allergies today and no changes were needed.  Please see the problem list section below in epic for further details.  Past Medical History: Past Medical History:  Diagnosis Date  . ANXIETY 07/04/2007  . ASTHMATIC BRONCHITIS, ACUTE 12/19/2007  . DIABETES MELLITUS, TYPE II 09/28/2006  . ERECTILE DYSFUNCTION 09/28/2006  . GERD 09/28/2006  . Hematochezia 05/29/2010  . HYPERLIPIDEMIA 09/28/2006  . HYPERTENSION 09/28/2006  . Illiterate 09/11/2012  . LOW BACK PAIN 09/28/2006  . OBESITY 09/28/2006  . PLANTAR FASCIITIS, BILATERAL 07/04/2007  . Stroke [facility])    Past Surgical History: Past Surgical History:  Procedure Laterality Date  . APPENDECTOMY     Social History: Social History   Socioeconomic History  . Marital status: Married    Spouse name: Not on file  . Number of children: 1  . Years of education: Not on file  . Highest education level: Not on file  Occupational History  . Occupation: Retired Personnel officer  Tobacco Use  . Smoking status: Former Smoker    Packs/day: 1.00    Years: 26.00    Pack years: 26.00    Quit date: 2000    Years since quitting: 22.3  . Smokeless tobacco: Never Used  Vaping Use   . Vaping Use: Never used  Substance and Sexual Activity  . Alcohol use: No  . Drug use: Not Currently    Types: Marijuana    Comment: occasional marijanua use anytime he can  . Sexual activity: Yes    Partners: Female  Other Topics Concern  . Not on file  Social History Narrative  . Not on file   Social Determinants of Health   Financial Resource Strain: Low Risk   . Difficulty of Paying Living Expenses: Not hard at all  Food Insecurity: No Food Insecurity  . Worried About Programme researcher, broadcasting/film/video in the Last Year: Never true  . Ran Out of Food in the Last Year: Never true  Transportation Needs: No Transportation Needs  . Lack of Transportation (Medical): No  . Lack of Transportation (Non-Medical): No  Physical Activity: Inactive  . Days of Exercise per Week: 0 days  . Minutes of Exercise per Session: 0 min  Stress: No Stress Concern Present  . Feeling of Stress : Not at all  Social Connections: Socially Isolated  . Frequency of Communication with Friends and Family: Once a week  . Frequency of Social Gatherings with Friends and Family: Never  . Attends Religious Services: Never  . Active Member of Clubs or Organizations: No  . Attends Banker Meetings: Never  . Marital Status: Married   Family History: Family History  Problem Relation Age of Onset  .  Colon polyps Sister   . Stroke Sister   . Lung cancer Sister   . Coronary artery disease Brother   . Heart attack Brother   . Diabetes Brother        2 brothers   . Hypertension Brother   . Stroke Brother   . Heart attack Mother   . Bone cancer Sister   . Brain cancer Sister    Allergies: Allergies  Allergen Reactions  . Hydrochlorothiazide Other (See Comments)    Dizziness   Medications: See med rec.  Review of Systems: See HPI for pertinent positives and negatives.   Objective:    General: Well Developed, well nourished, and in no acute distress.  Neuro: Alert and oriented x3.  HEENT:  Normocephalic, atraumatic.  Skin: Warm and dry. Cardiac: Regular rate and rhythm, no murmurs rubs or gallops, no lower extremity edema.  Respiratory: Clear to auscultation bilaterally. Not using accessory muscles, speaking in full sentences.  Impression and Recommendations:    1. Constipation, unspecified constipation type Possible related to Trelegy use but also consider gastroparesis due to poorly controlled diabetes. Adding Metamucil twice daily. Increase Senokot to twice daily. Will send Dulcolax suppositories to use daily prn but advised to only use this as a last resort.   Return in about 4 weeks (around 07/01/2020), or if symptoms worsen or fail to improve, for constipation/DM follow up. ___________________________________________ Thayer Ohm, DNP, APRN, FNP-BC Primary Care and Sports Medicine [facility] [place]

## 2020-06-07 ENCOUNTER — Telehealth: Payer: Self-pay | Admitting: Pulmonary Disease

## 2020-06-07 DIAGNOSIS — R0609 Other forms of dyspnea: Secondary | ICD-10-CM

## 2020-06-07 DIAGNOSIS — R0600 Dyspnea, unspecified: Secondary | ICD-10-CM

## 2020-06-07 DIAGNOSIS — R059 Cough, unspecified: Secondary | ICD-10-CM

## 2020-06-07 NOTE — Telephone Encounter (Signed)
LABA and nebulized ICS do not appear to be covered. Recommend duoneb QID. Feel free to send to wherever patient prefers, may need DME company as well order for nebulizer.

## 2020-06-07 NOTE — Telephone Encounter (Signed)
lmtcb for pt.  

## 2020-06-07 NOTE — Telephone Encounter (Signed)
He has been on this medicine for less than 1 week. Does he feel like it is not helping his breathing? May be a bit early to see benefit.   If insistent on change and cost is issue, please engage our pharmacy colleagues to determine best ICS/LABA option.

## 2020-06-07 NOTE — Telephone Encounter (Signed)
Called and spoke with Patient. Patient stated he doesn't feel that Virgel Bouquet is working for him.  Patient stated he doesn't feel like he is getting anything in his lungs. Patient stated Virgel Bouquet cost him $100 for 1 inhaler. Patient requested a regular inhaler type within his budget.  Message routed to Dr. Judeth Horn to advise  LOV 06/03/20   Instructions    Return in about 6 months (around 12/03/2020). Nice to see you again  I am sorry about constipation, stop using the Trelegy as this can be a rare side effect.  Use Breo 1 puff daily, rinse your mouth out after every use.  This is to replace the Trelegy.  For the nasal congestion and runny nose, take  Zyrtec solution as prescribed at night as well as montelukast 1 tablet at night.  I am hopeful this will dry up and help with nasal congestion.  Return to clinic in 6 months for follow-up with Dr. Judeth Horn

## 2020-06-07 NOTE — Telephone Encounter (Signed)
Patient called and spoke with patient about meds. Patient has Virgel Bouquet but is not wanting to use it and he feels it will be just like Trelegy which did not help. He is requesting nebulizer meds as he feels this helped him the most and was easier for him to get into lungs. Told patient I would route to Dr. Judeth Horn and will reach out when we hear back. Patient verbalized understanding.  Dr. Judeth Horn, please advise.

## 2020-06-07 NOTE — Telephone Encounter (Signed)
Left message for patient to call back  

## 2020-06-08 ENCOUNTER — Other Ambulatory Visit: Payer: Self-pay | Admitting: Medical-Surgical

## 2020-06-08 MED ORDER — IPRATROPIUM-ALBUTEROL 0.5-2.5 (3) MG/3ML IN SOLN: 3.0000 mL | Freq: Four times a day (QID) | RESPIRATORY_TRACT | 5 refills | Status: DC

## 2020-06-08 MED ORDER — IPRATROPIUM-ALBUTEROL 0.5-2.5 (3) MG/3ML IN SOLN: 3.0000 mL | Freq: Four times a day (QID) | RESPIRATORY_TRACT | 5 refills | Status: AC

## 2020-06-08 NOTE — Telephone Encounter (Signed)
Called and spoke to pt. Informed him of the recs per Dr. Judeth Horn. Pt states he would prefer a mist inhaler. I advised pt I would call insurance and inquire the cost and call back.   Called [employer] at [phone] and spoke to rep. All of the alternatives to [facility] inhaler are at the same tier. I questioned if the Duoneb would be a lesser tier, the rep confirmed this. He created an override to allow the medication to be processed through part B and states it can be sent to local pharmacy.   Called and spoke to pt. Informed him of the above information. Pt agreeable to start Duoneb. Rx sent to preferred local pharmacy and order placed for neb machine. Pt aware to contact us back if he doesn't receive the machine or medication is too expensive.   Spent over 1.5 hours on call/message.   --------------------------------------   Duoneb was printed and not sent electronically. Will forward to triage to try to send it electronically and if it still prints then it will need to be faxed to the [facility] on [address].   Can close encounter once complete.

## 2020-06-08 NOTE — Telephone Encounter (Signed)
Order replaced under class looks like it went through to the pharmacy   Called pharmacy and they do not have the RX.   will have to print and fax once signed

## 2020-06-08 NOTE — Telephone Encounter (Signed)
RX will send to pharmacy for some reason. I have printed the RX. Will have MH to sign tomorrow once he returns to clinic.

## 2020-06-09 ENCOUNTER — Telehealth: Payer: Self-pay | Admitting: Pulmonary Disease

## 2020-06-09 DIAGNOSIS — J45909 Unspecified asthma, uncomplicated: Secondary | ICD-10-CM

## 2020-06-09 NOTE — Telephone Encounter (Signed)
I called and spoke with Tiffany with [employer] and she stated that they have tried to get the nebulizer covered by his insurance but they will not cover this with the cough and dypnea that was sent with the order.  Tiffany stated that the dx has to be a respiratory dx like asthma, etc.  She offered to sell this to him out of pocket and it would be $90 but the pt stated that he could not afford that.  MH please advise. Thanks

## 2020-06-09 NOTE — Telephone Encounter (Signed)
Asthma diagnosis code fits his symptoms and is ok to use.

## 2020-06-09 NOTE — Telephone Encounter (Signed)
I have tried to call [employer] back but they have a long wait time.  Will try back later.

## 2020-06-09 NOTE — Telephone Encounter (Signed)
Duoneb rx given to front to fax to [phone].

## 2020-06-13 NOTE — Telephone Encounter (Signed)
Called [employer] and was on hold for more than 15 min. New order placed with new diagnosis since it has been difficult to reach [employer].   Will forward to [facility] to make aware.

## 2020-06-16 ENCOUNTER — Telehealth: Payer: Self-pay

## 2020-06-16 ENCOUNTER — Telehealth: Payer: Self-pay | Admitting: Pulmonary Disease

## 2020-06-16 NOTE — Telephone Encounter (Signed)
Called patient but he did not answer. Left message for him to call back.  

## 2020-06-16 NOTE — Telephone Encounter (Signed)
Pt went to pharmacy to get a refill of Freestyle Libre sensors and was told it would be $280. He states he is already in the donut hole. Please advise.

## 2020-06-16 NOTE — Telephone Encounter (Signed)
Pt called and LVM stating he started using the nebulizer that Dr. Silas Flood prescribed for him and then started having a ST. When I called him back, he has additional sx of cough, congestion, green-white mucus, wheezing, and ShOB. He said that he has been using the nebulizer but that is making the sx worse. I recommended that he call Dr. Silas Flood since Caryl Asp is not here this afternoon and see what he said and I also recommended a telephone visit with [name] tomorrow morning. He was in agreement to schedule a telephone visit with [name]. I instructed him to try and get some vital signs for the appointment in the morning and that if he was able to that he may be able to get a COVID test kit at the pharmacy and then we would know the results for the telephone visit in the morning. Pt verbalized understanding and said he would try and reach out to Dr. Kavin Leech office now. No further questions or concerns at this time.

## 2020-06-16 NOTE — Progress Notes (Signed)
Virtual Visit via Telephone   I connected with  John Roman  on 06/17/20 by telephone/telehealth and verified that I am speaking with the correct person using two identifiers.   I discussed the limitations, risks, security and privacy concerns of performing an evaluation and management service by telephone, including the higher likelihood of inaccurate diagnosis and treatment, and the availability of in person appointments.  We also discussed the likely need of an additional face to face encounter for complete and high quality delivery of care.  I also discussed with the patient that there may be a patient responsible charge related to this service. The patient expressed understanding and wishes to proceed.  Provider location is in medical facility. Patient location is at their home, different from provider location. People involved in care of the patient during this telehealth encounter were myself, my nurse/medical assistant, and my front office/scheduling team member.  CC: upper respiratory symptoms  HPI: Pleasant 65 year old male presenting via telephone for cough, congestion, worsened shortness of breath, and wheezing. His symptoms started about 4 days ago and have progressively worsened. Cough is productive of green to white sputum. Similar appearance to nasal discharge. No fevers, chills, chest pain, palpitations, nausea, vomiting, or diarrhea. Has not tested for COVID. Notes his son has been sick with similar symptoms but doesn't think he has tested for COVID either. Has been using nebulizers as prescribed by pulmonology but this seems to make his symptoms worse so he stopped them and has called Dr. Laurena Spies office. Has not spoken to them yet. Thought about getting some Sudafed to help with symptoms.    Review of Systems: See HPI for pertinent positives and negatives.   Objective Findings:    General: Speaking full sentences, no audible heavy breathing.  Voice hoarse, strong cough.  Sounds alert and appropriately interactive.    Impression and Recommendations:    1. Sinobronchitis Recommend COVID testing. With a history of bronchitic asthma and possible COPD, treating with a burst of Prednisone and Doxycycline. Advised caution with OTC cold/flu/sinus medications as this will worsen his BP. Continue nebulilzers as prescribed. Reviewed symptoms that should prompt UC/ED evaluation.   I discussed the above assessment and treatment plan with the patient. The patient was provided an opportunity to ask questions and all were answered. The patient agreed with the plan and demonstrated an understanding of the instructions.   The patient was advised to call back or seek an in-person evaluation if the symptoms worsen or if the condition fails to improve as anticipated.  20 minutes of non-face-to-face time was provided during this encounter.  Return if symptoms worsen or fail to improve. ___________________________________________ Christen Butter, DNP, APRN, FNP-BC Primary Care and Sports Medicine [facility]

## 2020-06-16 NOTE — Telephone Encounter (Signed)
had trouble understanding pt -- pt  has been having trouble breathing, currently has a cold started on 06/15/20. Has not be able to use Nebulizer due to having cold because he  States that he hasnt been able to breathe well using it, pt is wondering if he should wait to cold is gone to continue usage of machine . Please advise [phone]

## 2020-06-17 ENCOUNTER — Ambulatory Visit (INDEPENDENT_AMBULATORY_CARE_PROVIDER_SITE_OTHER): Payer: HMO | Admitting: Medical-Surgical

## 2020-06-17 ENCOUNTER — Encounter: Payer: Self-pay | Admitting: Medical-Surgical

## 2020-06-17 ENCOUNTER — Telehealth (INDEPENDENT_AMBULATORY_CARE_PROVIDER_SITE_OTHER): Payer: HMO | Admitting: Medical-Surgical

## 2020-06-17 ENCOUNTER — Other Ambulatory Visit: Payer: Self-pay | Admitting: Medical-Surgical

## 2020-06-17 VITALS — BP 149/86 | HR 85 | Temp 98.0°F

## 2020-06-17 DIAGNOSIS — J329 Chronic sinusitis, unspecified: Secondary | ICD-10-CM

## 2020-06-17 DIAGNOSIS — J40 Bronchitis, not specified as acute or chronic: Secondary | ICD-10-CM

## 2020-06-17 MED ORDER — DOXYCYCLINE HYCLATE 100 MG PO TABS: 100.0000 mg | ORAL_TABLET | Freq: Two times a day (BID) | ORAL | 0 refills | Status: AC

## 2020-06-17 MED ORDER — PREDNISONE 50 MG PO TABS: 50.0000 mg | ORAL_TABLET | Freq: Every day | ORAL | 0 refills | Status: DC

## 2020-06-17 NOTE — Progress Notes (Signed)
Established Patient Office Visit  Subjective:  Patient ID: John Roman, male    DOB: 1955/07/12  Age: 65 y.o. MRN: [id number]  CC:  Chief Complaint  Patient presents with  . Covid Exposure    Drive up swab    HPI John Roman presents for a drive up Covid test. Pt swabbed, test put in box for [facility] pickup.   Past Medical History:  Diagnosis Date  . ANXIETY 07/04/2007  . ASTHMATIC BRONCHITIS, ACUTE 12/19/2007  . DIABETES MELLITUS, TYPE II 09/28/2006  . ERECTILE DYSFUNCTION 09/28/2006  . GERD 09/28/2006  . Hematochezia 05/29/2010  . HYPERLIPIDEMIA 09/28/2006  . HYPERTENSION 09/28/2006  . Illiterate 09/11/2012  . LOW BACK PAIN 09/28/2006  . OBESITY 09/28/2006  . PLANTAR FASCIITIS, BILATERAL 07/04/2007  . Stroke [facility])     Past Surgical History:  Procedure Laterality Date  . APPENDECTOMY      Family History  Problem Relation Age of Onset  . Colon polyps Sister   . Stroke Sister   . Lung cancer Sister   . Coronary artery disease Brother   . Heart attack Brother   . Diabetes Brother        2 brothers   . Hypertension Brother   . Stroke Brother   . Heart attack Mother   . Bone cancer Sister   . Brain cancer Sister     Social History   Socioeconomic History  . Marital status: Married    Spouse name: Not on file  . Number of children: 1  . Years of education: Not on file  . Highest education level: Not on file  Occupational History  . Occupation: Retired Personnel officer  Tobacco Use  . Smoking status: Former Smoker    Packs/day: 1.00    Years: 26.00    Pack years: 26.00    Quit date: 2000    Years since quitting: 22.3  . Smokeless tobacco: Never Used  Vaping Use  . Vaping Use: Never used  Substance and Sexual Activity  . Alcohol use: No  . Drug use: Not Currently    Types: Marijuana    Comment: occasional marijanua use anytime he can  . Sexual activity: Yes    Partners: Female  Other Topics Concern  . Not on file  Social History Narrative  . Not on file    Social Determinants of Health   Financial Resource Strain: Low Risk   . Difficulty of Paying Living Expenses: Not hard at all  Food Insecurity: No Food Insecurity  . Worried About Programme researcher, broadcasting/film/video in the Last Year: Never true  . Ran Out of Food in the Last Year: Never true  Transportation Needs: No Transportation Needs  . Lack of Transportation (Medical): No  . Lack of Transportation (Non-Medical): No  Physical Activity: Inactive  . Days of Exercise per Week: 0 days  . Minutes of Exercise per Session: 0 min  Stress: No Stress Concern Present  . Feeling of Stress : Not at all  Social Connections: Socially Isolated  . Frequency of Communication with Friends and Family: Once a week  . Frequency of Social Gatherings with Friends and Family: Never  . Attends Religious Services: Never  . Active Member of Clubs or Organizations: No  . Attends Banker Meetings: Never  . Marital Status: Married  Catering manager Violence: Not At Risk  . Fear of Current or Ex-Partner: No  . Emotionally Abused: No  . Physically Abused: No  . Sexually Abused: No  Outpatient Medications Prior to Visit  Medication Sig Dispense Refill  . albuterol (VENTOLIN HFA) 108 (90 Base) MCG/ACT inhaler Inhale 2 puffs into the lungs every 6 (six) hours as needed for wheezing or shortness of breath. 18 g 3  . aspirin 81 MG EC tablet TAKE 1 TABLET BY MOUTH EVERY DAY 90 tablet 3  . atorvastatin (LIPITOR) 80 MG tablet TAKE 1 TABLET (80 MG TOTAL) BY MOUTH DAILY AT 6 PM. 90 tablet 0  . bisacodyl (DULCOLAX) 10 MG suppository Place 1 suppository (10 mg total) rectally as needed for moderate constipation. 12 suppository 3  . cetirizine HCl (ZYRTEC) 5 MG/5ML SOLN Take 10 mLs (10 mg total) by mouth daily. 300 mL 11  . clopidogrel (PLAVIX) 75 MG tablet Take 1 tablet (75 mg total) by mouth daily. 90 tablet 3  . Continuous Blood Gluc Receiver (FREESTYLE LIBRE 14 DAY READER) DEVI See admin instructions. Use to test  blood sugar    . Continuous Blood Gluc Sensor (FREESTYLE LIBRE 14 DAY SENSOR) MISC USE EVERY 14 DAYS 6 each 3  . doxycycline (VIBRA-TABS) 100 MG tablet Take 1 tablet (100 mg total) by mouth 2 (two) times daily for 7 days. 14 tablet 0  . ferrous sulfate 325 (65 FE) MG EC tablet Take 1 tablet (325 mg total) by mouth 3 (three) times daily with meals. 90 tablet 3  . fluticasone furoate-vilanterol (BREO ELLIPTA) 200-25 MCG/INH AEPB Inhale 1 puff into the lungs daily. 60 each 11  . guaiFENesin (MUCINEX) 600 MG 12 hr tablet Take 1 tablet (600 mg total) by mouth 2 (two) times daily. 30 tablet 0  . insulin degludec (TRESIBA FLEXTOUCH) 100 UNIT/ML FlexTouch Pen Inject 60 Units into the skin daily. 15 mL 3  . Insulin Pen Needle (PEN NEEDLES) 32G X 4 MM MISC 180 each by Does not apply route 2 (two) times daily. 200 each 11  . ipratropium-albuterol (DUONEB) 0.5-2.5 (3) MG/3ML SOLN Take 3 mLs by nebulization in the morning, at noon, in the evening, and at bedtime. 360 mL 5  . losartan (COZAAR) 25 MG tablet TAKE 1 TABLET (25 MG TOTAL) BY MOUTH DAILY. 90 tablet 0  . metFORMIN (GLUCOPHAGE) 500 MG tablet TAKE 1 TABLET BY MOUTH 2 TIMES DAILY WITH A MEAL. 180 tablet 0  . montelukast (SINGULAIR) 10 MG tablet Take 1 tablet (10 mg total) by mouth at bedtime. 30 tablet 11  . NOVOLOG FLEXPEN 100 UNIT/ML FlexPen INJECT 20-25 UNITS INTO THE SKIN 3 (THREE) TIMES DAILY BEFORE MEALS AS NEEDED 15 mL 3  . pantoprazole (PROTONIX) 40 MG tablet Take 1 tablet (40 mg total) by mouth daily. TAKE 1 TABLET BY MOUTH EVERY DAY 90 tablet 1  . predniSONE (DELTASONE) 50 MG tablet Take 1 tablet (50 mg total) by mouth daily. 5 tablet 0  . psyllium (METAMUCIL SMOOTH TEXTURE) 28 % packet Take 1 packet by mouth 2 (two) times daily. 1 packet 60  . senna-docusate (SENOKOT-S) 8.6-50 MG tablet Take 2 tablets by mouth 2 (two) times daily as needed for mild constipation. Until stooling regularly 60 tablet 3   Facility-Administered Medications Prior to  Visit  Medication Dose Route Frequency Provider Last Rate Last Admin  . albuterol (VENTOLIN HFA) 108 (90 Base) MCG/ACT inhaler 2 puff  2 puff Inhalation Once Christen Butter, NP        Allergies  Allergen Reactions  . Hydrochlorothiazide Other (See Comments)    Dizziness    ROS Review of Systems    Objective:  Physical Exam  There were no vitals taken for this visit. Wt Readings from Last 3 Encounters:  06/03/20 285 lb 4.8 oz (129.4 kg)  06/03/20 287 lb (130.2 kg)  05/06/20 278 lb (126.1 kg)     Health Maintenance Due  Topic Date Due  . OPHTHALMOLOGY EXAM  Never done  . COLONOSCOPY (Pts 45-46yrs Insurance coverage will need to be confirmed)  05/28/2020    There are no preventive care reminders to display for this patient.  Lab Results  Component Value Date   TSH 3.98 05/11/2020   Lab Results  Component Value Date   WBC 5.9 05/28/2020   HGB 11.7 (L) 05/28/2020   HCT 39.8 05/28/2020   MCV 77.4 (L) 05/28/2020   PLT 259 05/28/2020   Lab Results  Component Value Date   NA 135 05/28/2020   K 4.4 05/28/2020   CO2 28 05/28/2020   GLUCOSE 205 (H) 05/28/2020   BUN 15 05/28/2020   CREATININE 0.89 05/28/2020   BILITOT 0.6 05/28/2020   ALKPHOS 64 05/28/2020   AST 25 05/28/2020   ALT 25 05/28/2020   PROT 6.5 05/28/2020   ALBUMIN 3.5 05/28/2020   CALCIUM 8.9 05/28/2020   ANIONGAP 7 05/28/2020   GFR 98.95 10/02/2013   Lab Results  Component Value Date   CHOL 145 06/26/2019   Lab Results  Component Value Date   HDL 38 (L) 06/26/2019   Lab Results  Component Value Date   LDLCALC 87 06/26/2019   Lab Results  Component Value Date   TRIG 103 06/26/2019   Lab Results  Component Value Date   CHOLHDL 3.8 06/26/2019   Lab Results  Component Value Date   HGBA1C 8.5 (A) 03/24/2020      Assessment & Plan:  Pt swabbed for Covid, test put in box for [facility] pickup.  Problem List Items Addressed This Visit   None   Visit Diagnoses    Sinobronchitis    -   Primary   Relevant Orders   Novel Coronavirus, NAA ([facility])      No orders of the defined types were placed in this encounter.   Follow-up: No follow-ups on file.    Kathrynn Speed, CMA

## 2020-06-17 NOTE — Telephone Encounter (Signed)
We have some samples of the Freestyle libre sensors we can give him for now. Thurston Hole is doing some paperwork to see if we can get his sensors at no or low cost. This will take a week or two to determine if he is approved. The samples should get him covered while we wait. He will have to come by and pick them up. If he can call us from his car, we can run them out to him since he is currently sick and shouldn't come into the building. If he can come by today, we can do a COVID swab on him :)

## 2020-06-17 NOTE — Telephone Encounter (Signed)
Patient was seen by PCP today.

## 2020-06-17 NOTE — Telephone Encounter (Signed)
Pt aware. Pt agreeable to testing and has been scheduled for a NV at 2:30 PM for testing. Telephone number for him to call when he gets here provided. Sample bag with sensors given to triage to give to pt when he comes by for testing.

## 2020-06-20 LAB — NOVEL CORONAVIRUS, NAA: SARS-CoV-2, NAA: NOT DETECTED

## 2020-06-20 LAB — SARS-COV-2, NAA 2 DAY TAT

## 2020-06-20 LAB — SPECIMEN STATUS REPORT

## 2020-06-21 ENCOUNTER — Telehealth: Payer: Self-pay | Admitting: Pulmonary Disease

## 2020-06-21 NOTE — Telephone Encounter (Signed)
Called pt back and he stated that he is on the abx now and feeling a little better.  He stated that he still has a lot of congestion and coughing.  Nothing further is needed.

## 2020-07-01 ENCOUNTER — Other Ambulatory Visit: Payer: Self-pay

## 2020-07-01 ENCOUNTER — Encounter: Payer: Self-pay | Admitting: Registered"

## 2020-07-01 ENCOUNTER — Encounter: Payer: HMO | Attending: Medical-Surgical | Admitting: Registered"

## 2020-07-01 DIAGNOSIS — E1165 Type 2 diabetes mellitus with hyperglycemia: Secondary | ICD-10-CM | POA: Diagnosis not present

## 2020-07-01 DIAGNOSIS — Z794 Long term (current) use of insulin: Secondary | ICD-10-CM | POA: Diagnosis not present

## 2020-07-01 NOTE — Progress Notes (Signed)
Diabetes Self-Management Education Visit Type: Follow-up  Appt. Start Time: 0910 Appt. End Time: 0945  07/01/2020  [name], identified by name and date of birth, is a 65 y.o. male with a diagnosis of Diabetes: Type 2.   ASSESSMENT  There were no vitals taken for this visit. There is no height or weight on file to calculate BMI.  Patient states some of his CGM sensors were bad and his insurance won't provide more sensors until next week. Pt states he got a sensor from his doctor's office but it only lasted 4 days. Pt states he was just planning on flying blind (not knowing what is blood sugar is) until June 3.  Patient expressed frustration that [facility] wouldn't help him get more sensors.   Pt states the other day his blood sugar was 240 at brother's house, before he ate. Pt reports he did not take any insulin went back home 10 am probably ate cheerios but didn't take Novolog because he couldn't check his blood sugar.   Pt states his blood sugar has been 230 when he gets up, Pt states he continues to use [place] 60 units. Pt states he is not using Novolog 30 units due to fear of hypoglycemia.  Pt states before his visit today he ate 3 pancakes, with [place] syrup doesn't know if sugar free or not, cheese, hot dog bun. Pt states it was because he was in a hurry to get to this appointment.  RD provided meter that patient states he knows how to use: [employer]che[employer] Guide Me Lot #[id number] Exp: 06/10/2021   Diabetes Self-Management Education - 07/01/20 1000      Visit Information   Visit Type Follow-up      Initial Visit   Diabetes Type Type 2      Psychosocial Assessment   Self-care barriers Low literacy      Complications   How often do you check your blood sugar? 0 times/day (not testing)      Dietary Intake   Breakfast 3 pancakes, syrup, cheese hot dog bun    Beverage(s) water diet pepsi      Patient Education   Nutrition management  Other (comment)   eat low carb meal if  not taking Nolovol   Monitoring Other (comment)   importance and options for checking blood sugar     Individualized Goals (developed by patient)   Nutrition Other (comment)   Low carb meal when not taking insulin     Outcomes   Expected Outcomes Demonstrated interest in learning. Expect positive outcomes    Future DMSE 4-6 wks    Program Status Not Completed      Subsequent Visit   Since your last visit have you continued or begun to take your medications as prescribed? No   ran out of CGM sensors, no novolog d/t fear of hypoglycemia          Individualized Plan for Diabetes Self-Management Training:   Learning Objective:  Patient will have a greater understanding of diabetes self-management. Patient education plan is to attend individual and/or group sessions per assessed needs and concerns.  Patient Instructions  Watch for a phone call from your doctor's office returning my call about your request to get another [place] sensor. Use the sample meter to check blood sugar before meals. If not comfortable taking Novolog before meals without knowing what your blood sugar is, be sure to eat a low carbohydrate meal with mostly protein and some vegetables. Continue to  take your Evaristo Bury even though you are not checking blood sugar. If you want to make sure you have enough sensors to last until your next refill, call the company to trouble shoot    Expected Outcomes:  Demonstrated interest in learning. Expect positive outcomes  Education material provided: none - patient is not able to read  If problems or questions, patient to contact team via:  Phone  Future DSME appointment: 4-6 wks

## 2020-07-01 NOTE — Patient Instructions (Signed)
Watch for a phone call from your doctor's office returning my call about your request to get another [place] sensor. Use the sample meter to check blood sugar before meals. If not comfortable taking Novolog before meals without knowing what your blood sugar is, be sure to eat a low carbohydrate meal with mostly protein and some vegetables. Continue to take your Evaristo Bury even though you are not checking blood sugar. If you want to make sure you have enough sensors to last until your next refill, call the company to trouble shoot

## 2020-07-04 ENCOUNTER — Other Ambulatory Visit: Payer: Self-pay | Admitting: Medical-Surgical

## 2020-07-04 DIAGNOSIS — E1165 Type 2 diabetes mellitus with hyperglycemia: Secondary | ICD-10-CM

## 2020-07-18 ENCOUNTER — Ambulatory Visit: Payer: HMO | Admitting: Pulmonary Disease

## 2020-07-18 ENCOUNTER — Encounter: Payer: Self-pay | Admitting: Pulmonary Disease

## 2020-07-18 ENCOUNTER — Other Ambulatory Visit: Payer: Self-pay

## 2020-07-18 DIAGNOSIS — R0609 Other forms of dyspnea: Secondary | ICD-10-CM

## 2020-07-18 DIAGNOSIS — J3489 Other specified disorders of nose and nasal sinuses: Secondary | ICD-10-CM

## 2020-07-18 DIAGNOSIS — R0600 Dyspnea, unspecified: Secondary | ICD-10-CM | POA: Diagnosis not present

## 2020-07-18 MED ORDER — CLOPIDOGREL BISULFATE 75 MG PO TABS: 75.0000 mg | ORAL_TABLET | Freq: Every day | ORAL | 1 refills | Status: AC

## 2020-07-18 MED ORDER — BUDESONIDE 0.5 MG/2ML IN SUSP: 0.5000 mg | Freq: Two times a day (BID) | RESPIRATORY_TRACT | 12 refills | Status: AC

## 2020-07-18 MED ORDER — CETIRIZINE HCL 5 MG/5ML PO SOLN: 10.0000 mg | Freq: Every day | ORAL | 11 refills | Status: AC

## 2020-07-18 NOTE — Patient Instructions (Signed)
Nice to see you  Take cetirizine 10 ml at night to help with nasal congestion.  Continue the DuoNebs 4 times a day.  Use a new medicine - budesonide nebulized twice a day - morning and evening. You can mix this with the DuNebs. I hope this helps the breathing.  Return to clinic in 3 months with Dr. Judeth Horn.

## 2020-07-18 NOTE — Progress Notes (Signed)
@Patient  ID: , male    DOB: [birth year], 65 y.o.   MRN: [id number]  Chief Complaint  Patient presents with   Follow-up    2 mo f/u. States his breathing has stable since last visit. Still using DuoNebs.     Referring provider: [id number], NP  HPI:   65 year old whom we are seeing in follow up for dyspnea on exertion, wheeze.  Recent PCP note 06/17/20 reviewed.  Still short of breath. Notes Duonebs do help - some times more than others. Using TID. Complains mainly of rhinorrhea, nasal congestion at night, early morning. None during day. But soaks pillow and blows nose with lots of snot in AM. Got some prednisone and doxycycline 5/13. Helped snot. Now back. Unsure if helped breathing. Denies any worsening cough. DOE stable to mildly improved but major issue.  HPI at initial visit: Patient has had several week history of increasing dyspnea on exertion.  Has been present for longer than that but worse over the timeframe above.  Worse with inclines or stairs.  Even has symptoms on flat surfaces of relatively short distances.  Symptoms seem worse at night.  No timing during the day when better or worse otherwise.  No environmental changes to account for symptoms.  He is unsure if seasonal changes make things better or worse.  He uses albuterol frequently and does get some minimal or mild relief with albuterol use.  No other alleviating or exacerbating factors.  Symptoms described as severe.  Chest x-ray 09/09/2019 reviewed with reason for exam wheezing, interpretation reveals clear lungs.  He had a speech pathology swallow test 02/11/2020 which was reviewed and demonstrates normal swallowing function.  TTE 04/2017 reviewed which demonstrated LVH and mild to moderate LA dilation with normal valvular function.  PMH: Coronary artery disease, diabetes, hypertension Surgical history: Appendectomy Family history: Mother with CAD, brother with CAD Social history: Former smoker, lives in  [place] / Pulmonary Flowsheets:   ACT:  No flowsheet data found.  MMRC: mMRC Dyspnea Scale mMRC Score  04/14/2020 3    Epworth:  No flowsheet data found.  Tests:   FENO:  No results found for: NITRICOXIDE  PFT: No flowsheet data found.  WALK:  No flowsheet data found.  Imaging: Personally reviewed and as per EMR and discussion in this note  Lab Results: Personally reviewed, no anemia CBC    Component Value Date/Time   WBC 5.9 05/28/2020 1113   RBC 5.14 05/28/2020 1113   HGB 11.7 (L) 05/28/2020 1113   HGB 14.1 07/21/2018 0943   HCT 39.8 05/28/2020 1113   HCT 42.4 07/21/2018 0943   PLT 259 05/28/2020 1113   PLT 217 07/21/2018 0943   MCV 77.4 (L) 05/28/2020 1113   MCV 80 07/21/2018 0943   MCH 22.8 (L) 05/28/2020 1113   MCHC 29.4 (L) 05/28/2020 1113   RDW 15.5 05/28/2020 1113   RDW 13.6 07/21/2018 0943   LYMPHSABS 1.2 05/28/2020 1113   MONOABS 0.6 05/28/2020 1113   EOSABS 0.1 05/28/2020 1113   BASOSABS 0.1 05/28/2020 1113    BMET    Component Value Date/Time   NA 135 05/28/2020 1113   NA 135 07/21/2018 0943   K 4.4 05/28/2020 1113   CL 100 05/28/2020 1113   CO2 28 05/28/2020 1113   GLUCOSE 205 (H) 05/28/2020 1113   BUN 15 05/28/2020 1113   BUN 13 07/21/2018 0943   CREATININE 0.89 05/28/2020 1113   CREATININE 0.94 05/11/2020 0000   CALCIUM 8.9  05/28/2020 1113   GFRNONAA >60 05/28/2020 1113   GFRNONAA 84 05/11/2020 0000   GFRAA 98 05/11/2020 0000    BNP    Component Value Date/Time   BNP 58.4 05/28/2020 1115    ProBNP No results found for: PROBNP  Specialty Problems       Pulmonary Problems   Cough    Allergies  Allergen Reactions   Hydrochlorothiazide Other (See Comments)    Dizziness    Immunization History  Administered Date(s) Administered   PFIZER(Purple Top)SARS-COV-2 Vaccination 09/14/2019, 10/15/2019   Td 02/05/2002    Past Medical History:  Diagnosis Date   ANXIETY 07/04/2007   ASTHMATIC  BRONCHITIS, ACUTE 12/19/2007   DIABETES MELLITUS, TYPE II 09/28/2006   ERECTILE DYSFUNCTION 09/28/2006   GERD 09/28/2006   Hematochezia 05/29/2010   HYPERLIPIDEMIA 09/28/2006   HYPERTENSION 09/28/2006   Illiterate 09/11/2012   LOW BACK PAIN 09/28/2006   OBESITY 09/28/2006   PLANTAR FASCIITIS, BILATERAL 07/04/2007   Stroke (HCC)     Tobacco History: Social History   Tobacco Use  Smoking Status Former   Packs/day: 1.00   Years: 26.00   Pack years: 26.00   Types: Cigarettes   Quit date: 2000   Years since quitting: 22.4  Smokeless Tobacco Never   Counseling given: Not Answered   Continue to not smoke  Outpatient Encounter Medications as of 07/18/2020  Medication Sig   albuterol (VENTOLIN HFA) 108 (90 Base) MCG/ACT inhaler Inhale 2 puffs into the lungs every 6 (six) hours as needed for wheezing or shortness of breath.   aspirin 81 MG EC tablet TAKE 1 TABLET BY MOUTH EVERY DAY   atorvastatin (LIPITOR) 80 MG tablet TAKE 1 TABLET (80 MG TOTAL) BY MOUTH DAILY AT 6 PM.   bisacodyl (DULCOLAX) 10 MG suppository Place 1 suppository (10 mg total) rectally as needed for moderate constipation.   budesonide (PULMICORT) 0.5 MG/2ML nebulizer solution Take 2 mLs (0.5 mg total) by nebulization in the morning and at bedtime.   clopidogrel (PLAVIX) 75 MG tablet Take 1 tablet (75 mg total) by mouth daily.   Continuous Blood Gluc Receiver (FREESTYLE LIBRE 14 DAY READER) DEVI See admin instructions. Use to test blood sugar   Continuous Blood Gluc Sensor (FREESTYLE LIBRE 14 DAY SENSOR) MISC USE EVERY 14 DAYS   ferrous sulfate 325 (65 FE) MG EC tablet Take 1 tablet (325 mg total) by mouth 3 (three) times daily with meals.   guaiFENesin (MUCINEX) 600 MG 12 hr tablet Take 1 tablet (600 mg total) by mouth 2 (two) times daily.   insulin degludec (TRESIBA FLEXTOUCH) 100 UNIT/ML FlexTouch Pen Inject 60 Units into the skin daily.   Insulin Pen Needle (PEN NEEDLES) 32G X 4 MM MISC 180 each by Does not apply route 2  (two) times daily.   ipratropium-albuterol (DUONEB) 0.5-2.5 (3) MG/3ML SOLN Take 3 mLs by nebulization in the morning, at noon, in the evening, and at bedtime.   losartan (COZAAR) 25 MG tablet TAKE 1 TABLET (25 MG TOTAL) BY MOUTH DAILY.   metFORMIN (GLUCOPHAGE) 500 MG tablet TAKE 1 TABLET BY MOUTH 2 TIMES DAILY WITH A MEAL.   montelukast (SINGULAIR) 10 MG tablet Take 1 tablet (10 mg total) by mouth at bedtime.   NOVOLOG FLEXPEN 100 UNIT/ML FlexPen INJECT 20-25 UNITS INTO THE SKIN 3 (THREE) TIMES DAILY BEFORE MEALS AS NEEDED   pantoprazole (PROTONIX) 40 MG tablet Take 1 tablet (40 mg total) by mouth daily. TAKE 1 TABLET BY MOUTH EVERY DAY   psyllium (  METAMUCIL SMOOTH TEXTURE) 28 % packet Take 1 packet by mouth 2 (two) times daily.   senna-docusate (SENOKOT-S) 8.6-50 MG tablet Take 2 tablets by mouth 2 (two) times daily as needed for mild constipation. Until stooling regularly   [DISCONTINUED] cetirizine HCl (ZYRTEC) 5 MG/5ML SOLN Take 10 mLs (10 mg total) by mouth daily.   [DISCONTINUED] fluticasone furoate-vilanterol (BREO ELLIPTA) 200-25 MCG/INH AEPB Inhale 1 puff into the lungs daily.   [DISCONTINUED] predniSONE (DELTASONE) 50 MG tablet Take 1 tablet (50 mg total) by mouth daily.   cetirizine HCl (ZYRTEC) 5 MG/5ML SOLN Take 10 mLs (10 mg total) by mouth daily.   Facility-Administered Encounter Medications as of 07/18/2020  Medication   albuterol (VENTOLIN HFA) 108 (90 Base) MCG/ACT inhaler 2 puff     Review of Systems  Review of Systems  Denies chest pain with exertion.  Denies orthopnea or PND.  Comprehensive review of systems otherwise negative.  Physical Exam  BP 128/74   Pulse 84   Temp 98.6 F (37 C) (Temporal)   Ht 5\' 9"  (1.753 m)   Wt 284 lb 9.6 oz (129.1 kg)   SpO2 95% Comment: on RA  BMI 42.03 kg/m   Wt Readings from Last 5 Encounters:  07/18/20 284 lb 9.6 oz (129.1 kg)  06/03/20 285 lb 4.8 oz (129.4 kg)  06/03/20 287 lb (130.2 kg)  05/06/20 278 lb (126.1 kg)   04/14/20 283 lb 3.2 oz (128.5 kg)    BMI Readings from Last 5 Encounters:  07/18/20 42.03 kg/m  06/03/20 42.13 kg/m  06/03/20 42.38 kg/m  05/06/20 40.46 kg/m  04/14/20 44.36 kg/m     Physical Exam General: well appearing, in NAD Neck: No JVP, supple Eyes: EOMI, no icterus Pulm: CTAB, NWOB on RA CV: RRR, no murmurs Abdomen: Mild distension, bowel sounds present Psych: Normal mood, full affect  Assessment & Plan:   DOE, wheeze: Former smoker, possible COPD but quit in 30s vs asthma. Favor asthma given atopic symptoms. Does endorse improvement with Duonebs.  Trelegy caused constipation but seemed to help breathing. Prescribed breo but never started. Add budesonide nebs today for ICS/SABA/SAMA therapy. PFTs if not improving. Consider cardiac causes if PFTs normal.  Notably has LA hypertension/dilation on prior TTE  high suspicion for diastolic dysfunction worsen diastology with exertion.  Rhinorrhea: Related to pollen, allergies. Start Zyrtec and continue montelukast nightly. Consider saline rinse and flonase if not improving in future.   Return in about 3 months (around 10/18/2020).   10/20/2020, MD 07/18/2020

## 2020-07-19 ENCOUNTER — Telehealth: Payer: Self-pay | Admitting: Pulmonary Disease

## 2020-07-19 NOTE — Telephone Encounter (Signed)
PA request was received from (pharmacy): [facility] on [address] [phone] Fax: [phone] Medication name and strength: budesonide 0.5mg /65mL Ordering Provider: MH  Was PA started with CMM?: Yes If yes, please enter KEY: [id number] Medication tried and failed: Trelegy, Virgel Bouquet, DuoNeb Covered Alternatives:   PA sent to plan, time frame for approval / denial: 3-7 business days

## 2020-08-02 ENCOUNTER — Ambulatory Visit: Payer: HMO | Admitting: Medical-Surgical

## 2020-08-04 ENCOUNTER — Telehealth: Payer: Self-pay

## 2020-08-04 NOTE — Telephone Encounter (Signed)
John Roman with [facility] emailed over a certificate of death that is for cremation only. The death certificate has to be done electronically through the Day system which [name] is not signed up for. In order to proceed with the funeral arrangements, this form needs to be completed, scanned, and emailed back to [place]. The form has been placed in [name]'s inbox for completion.

## 2020-08-04 NOTE — Telephone Encounter (Signed)
Form completed, scanned into pt's chart, scanned in an e-mail to me, and then emailed the completed form to Lupita Leash at [facility]. I called Lupita Leash and verified she received the e-mail and was able to open the attachment without any issues. Instructed her to call back with any questions or concerns.

## 2020-08-05 ENCOUNTER — Ambulatory Visit: Payer: HMO | Admitting: Registered"

## 2020-08-05 DEATH — deceased

## 2020-09-15 ENCOUNTER — Ambulatory Visit: Payer: HMO | Admitting: Medical-Surgical

## 2021-02-06 IMAGING — DX DG CHEST 2V
3 series · 3 of 3 positions shown · non-contrast
Comparison: Chest radiograph 04/05/2017

CLINICAL DATA: Pt c/o worsening cough and wheezing over the past
two weeks, diabetes, GERD, hyperlipidemia, HTN

EXAM:
CHEST - 2 VIEW

[chest pa]
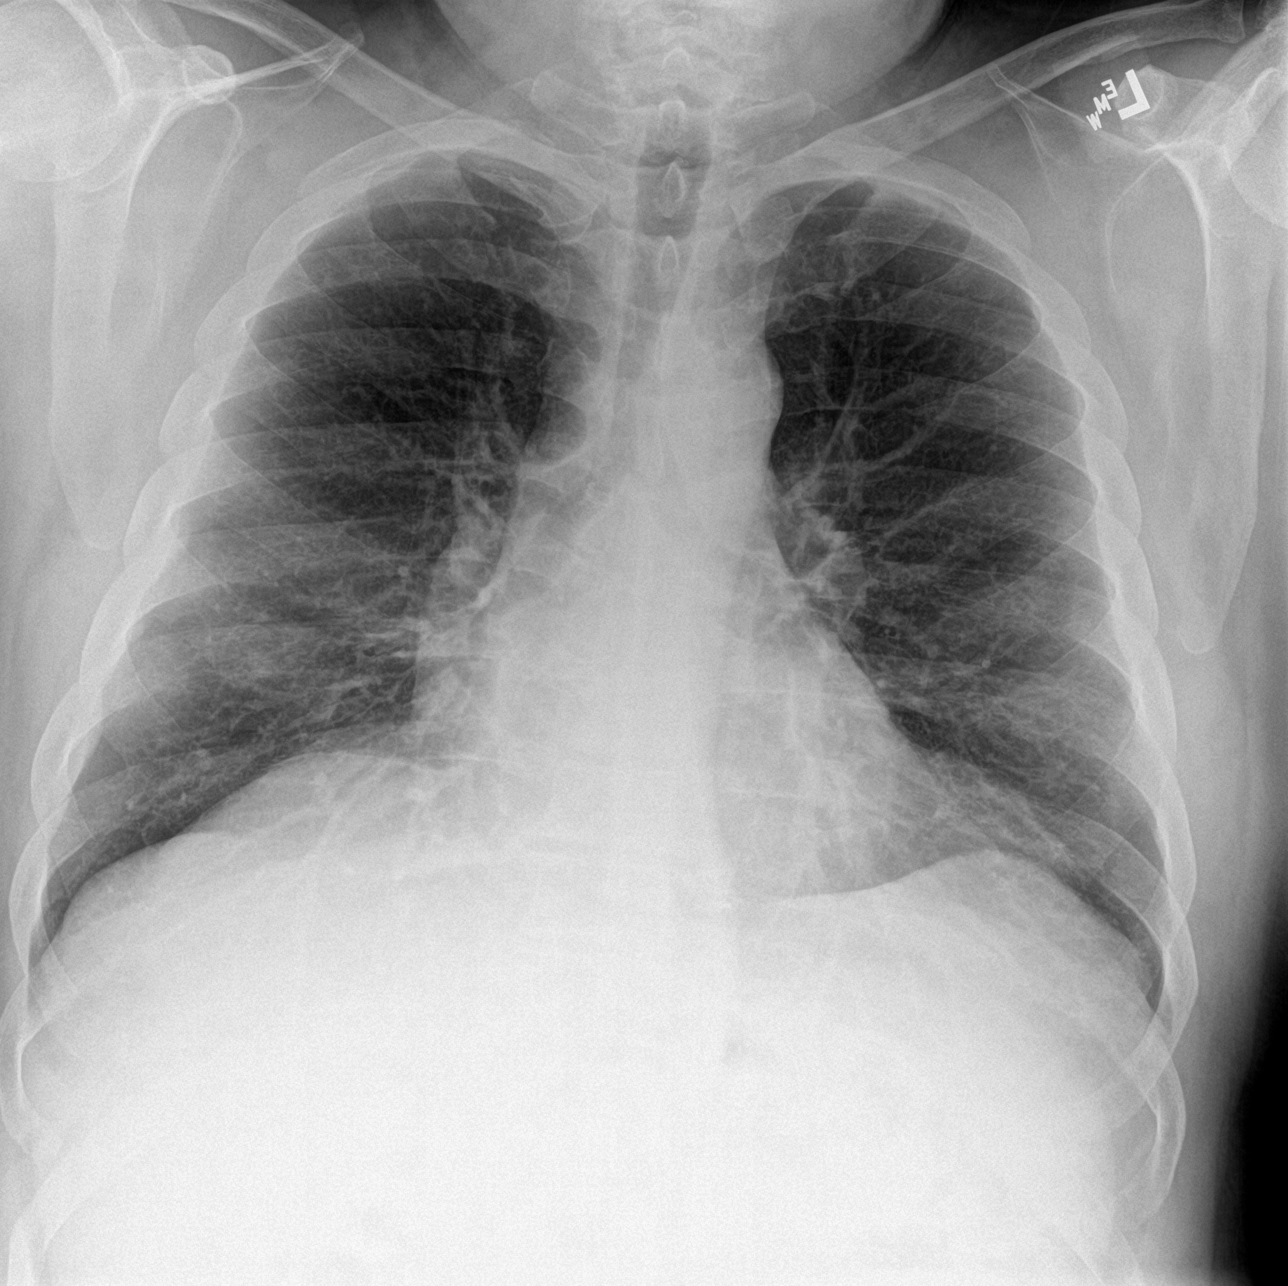

[chest lat (1 of 2)]
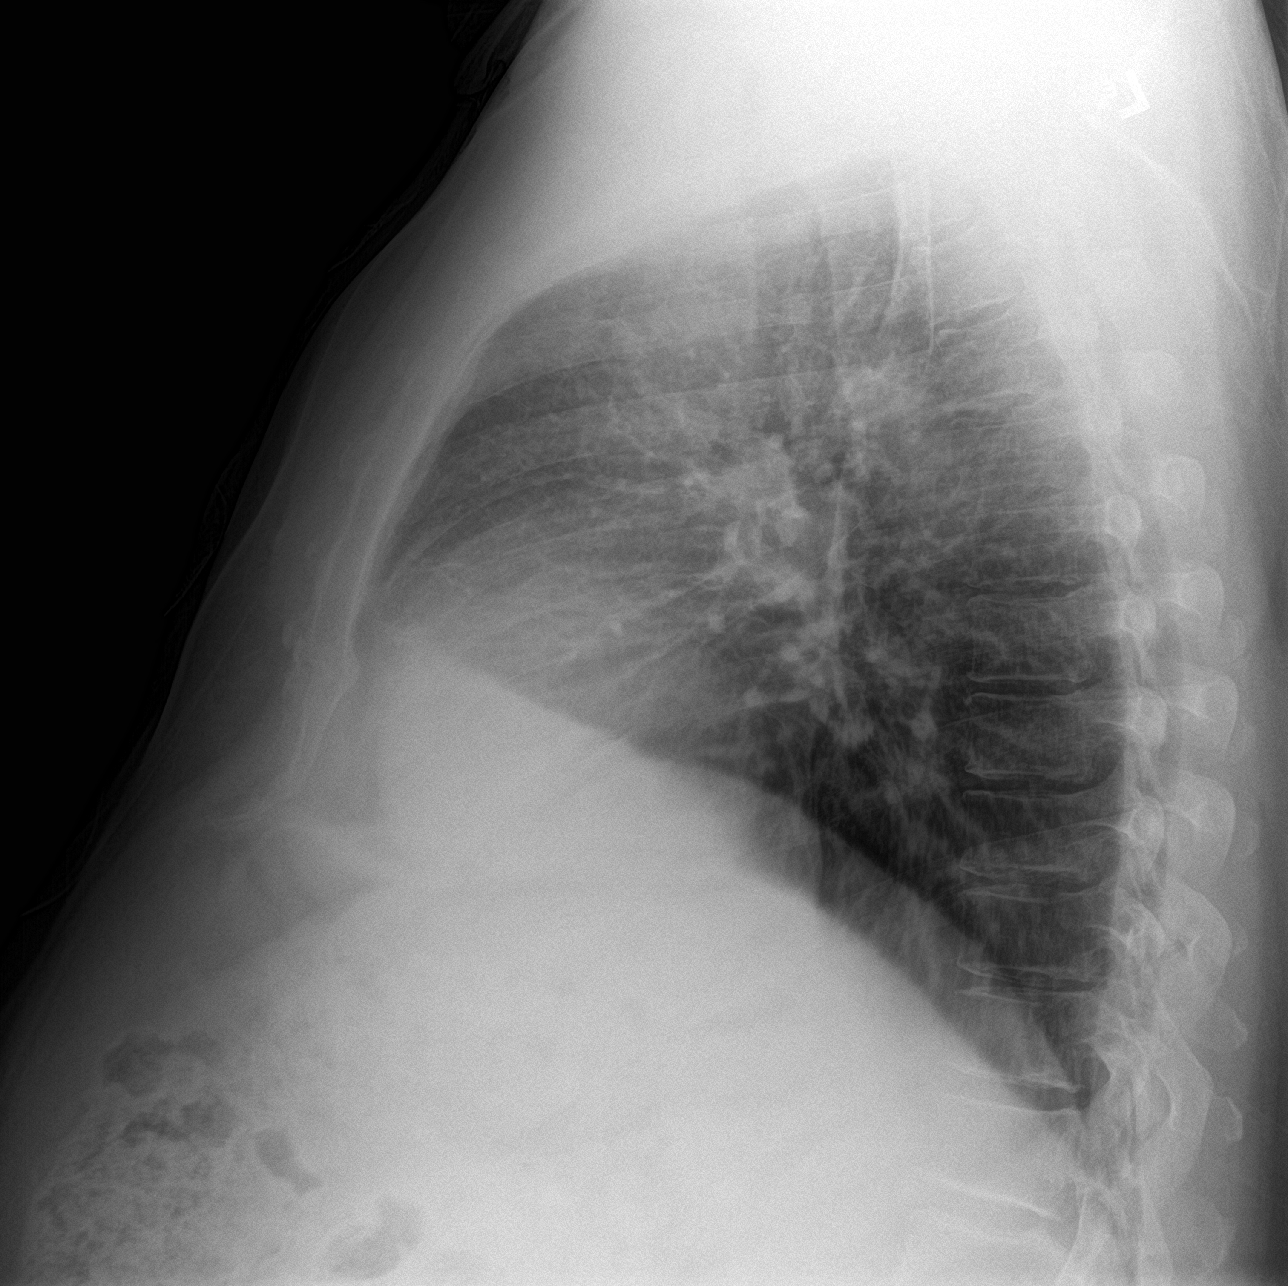

[chest lat (2 of 2)]
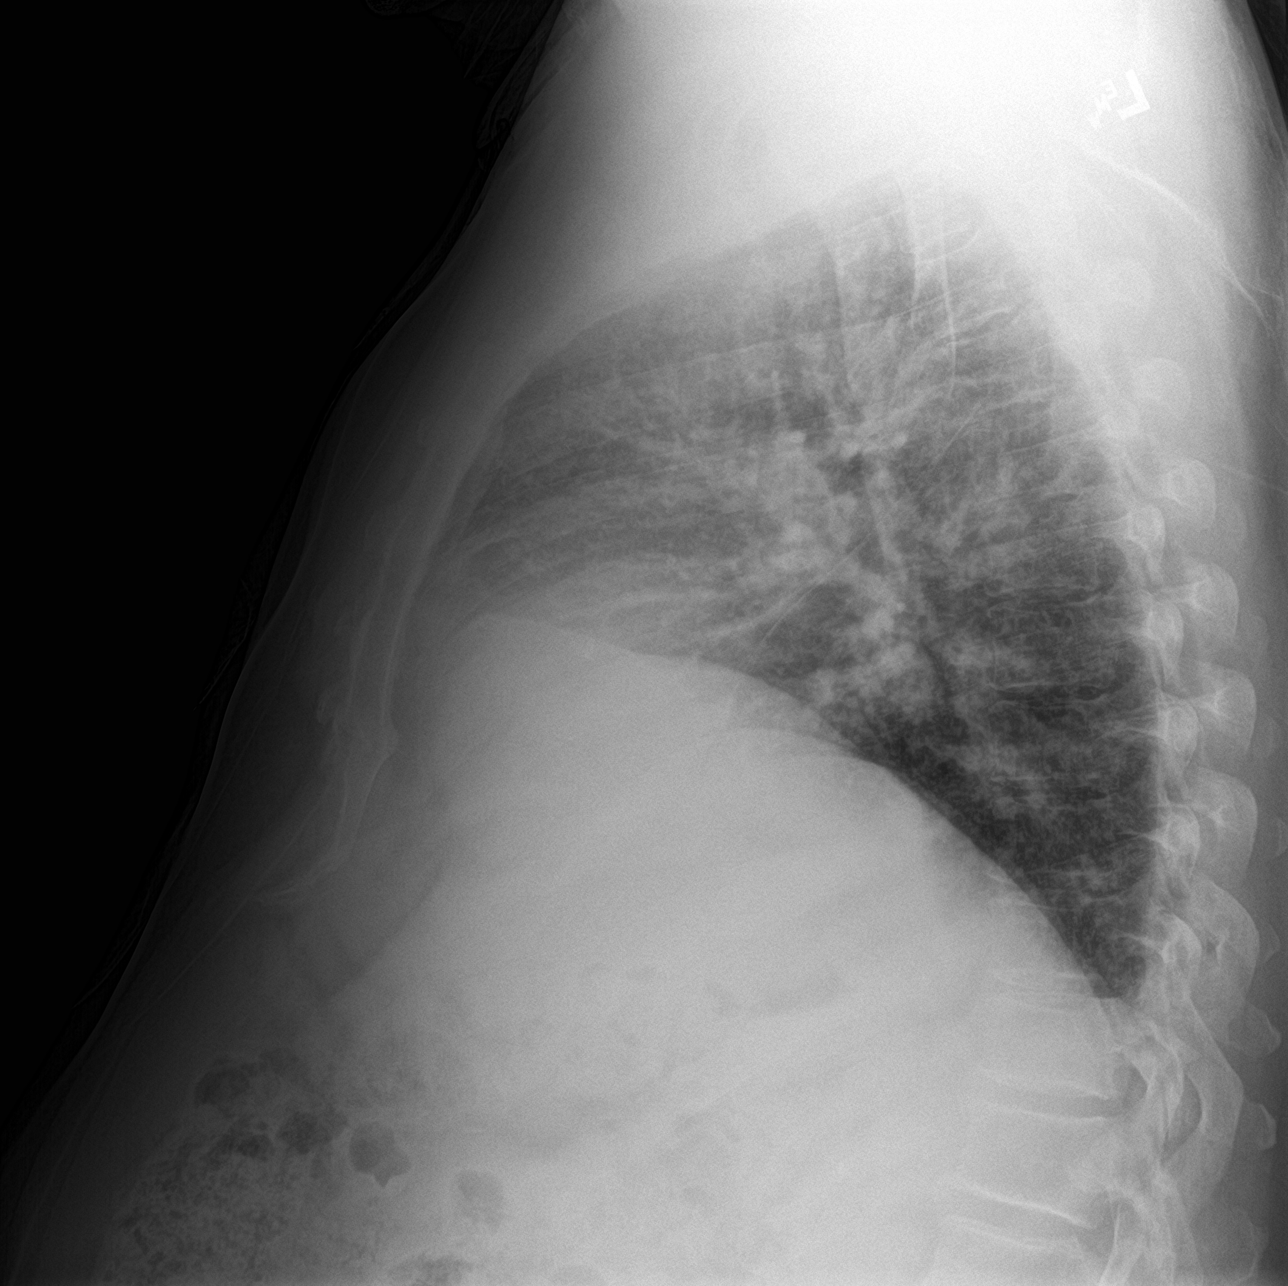

[3 of 3 positions shown; findings below may reference images not displayed]

FINDINGS: The heart size and mediastinal contours are within normal limits.
There is mild coarsening of the interstitium which is likely related
to former smoking. No focal pulmonary opacity. No pneumothorax or
pleural effusion. The visualized skeletal structures are
unremarkable.
IMPRESSION: No evidence of active disease.

## 2021-10-26 IMAGING — CR DG ABDOMEN ACUTE W/ 1V CHEST
3 series · 3 of 3 positions shown · non-contrast
Comparison: Chest x-ray 04/14/2020

CLINICAL DATA: Constipation, back pain, generalized body ache

EXAM:
DG ABDOMEN ACUTE WITH 1 VIEW CHEST

[chest pa]
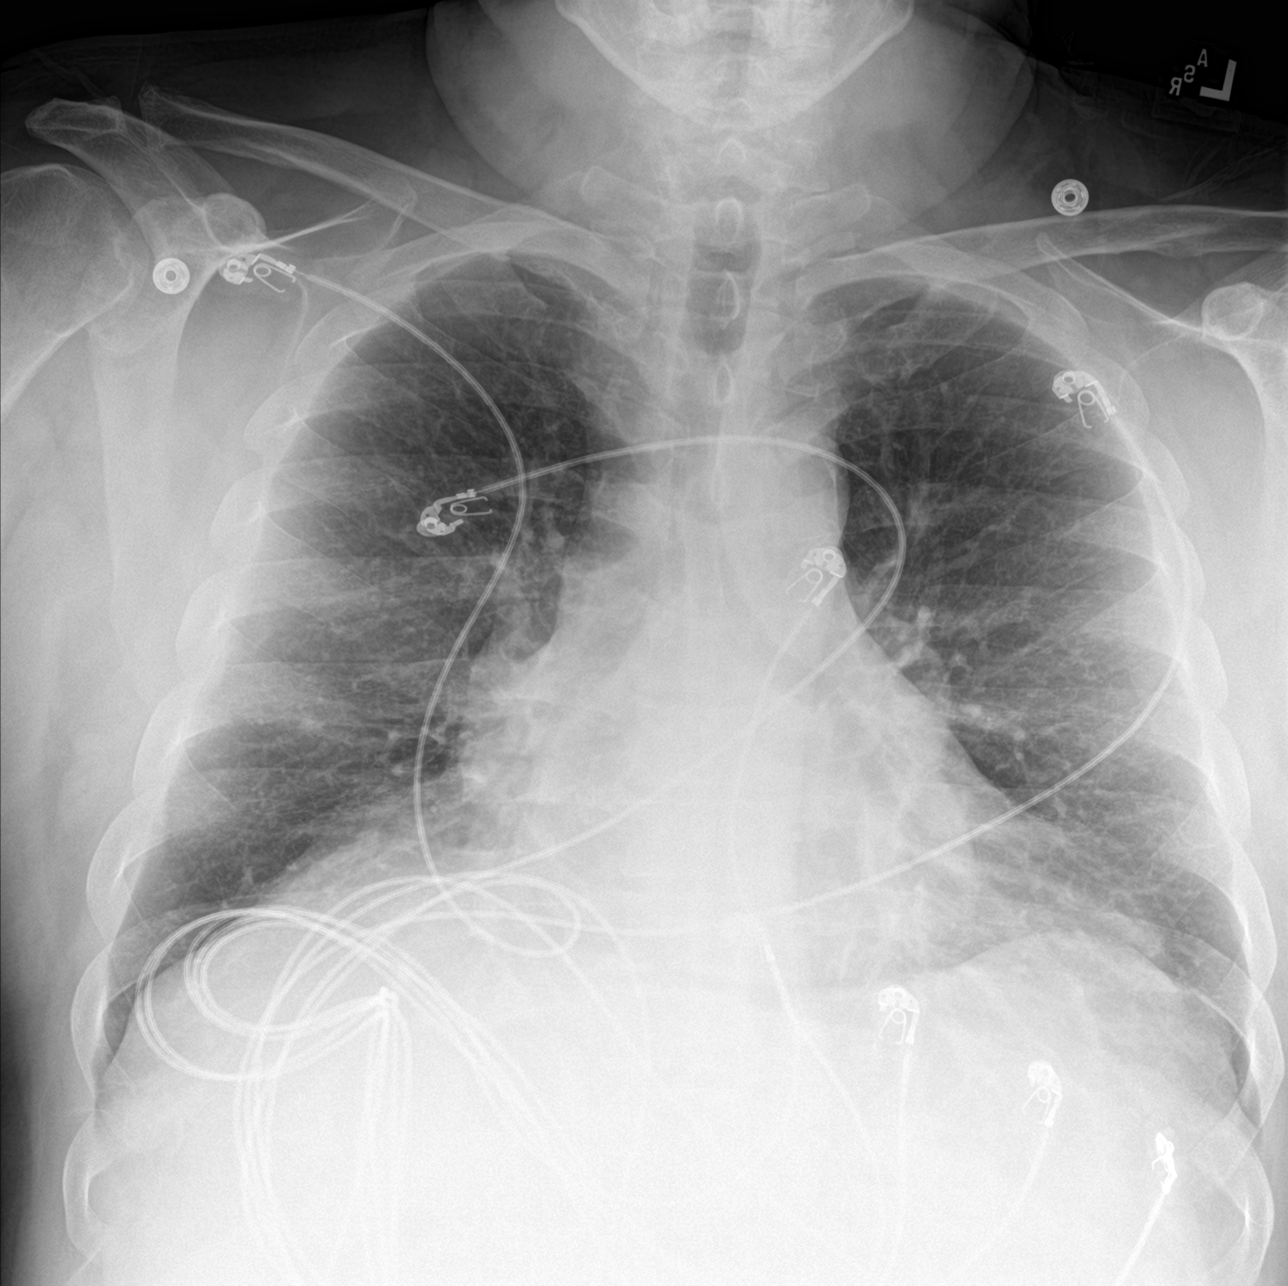

[abdomen supine]
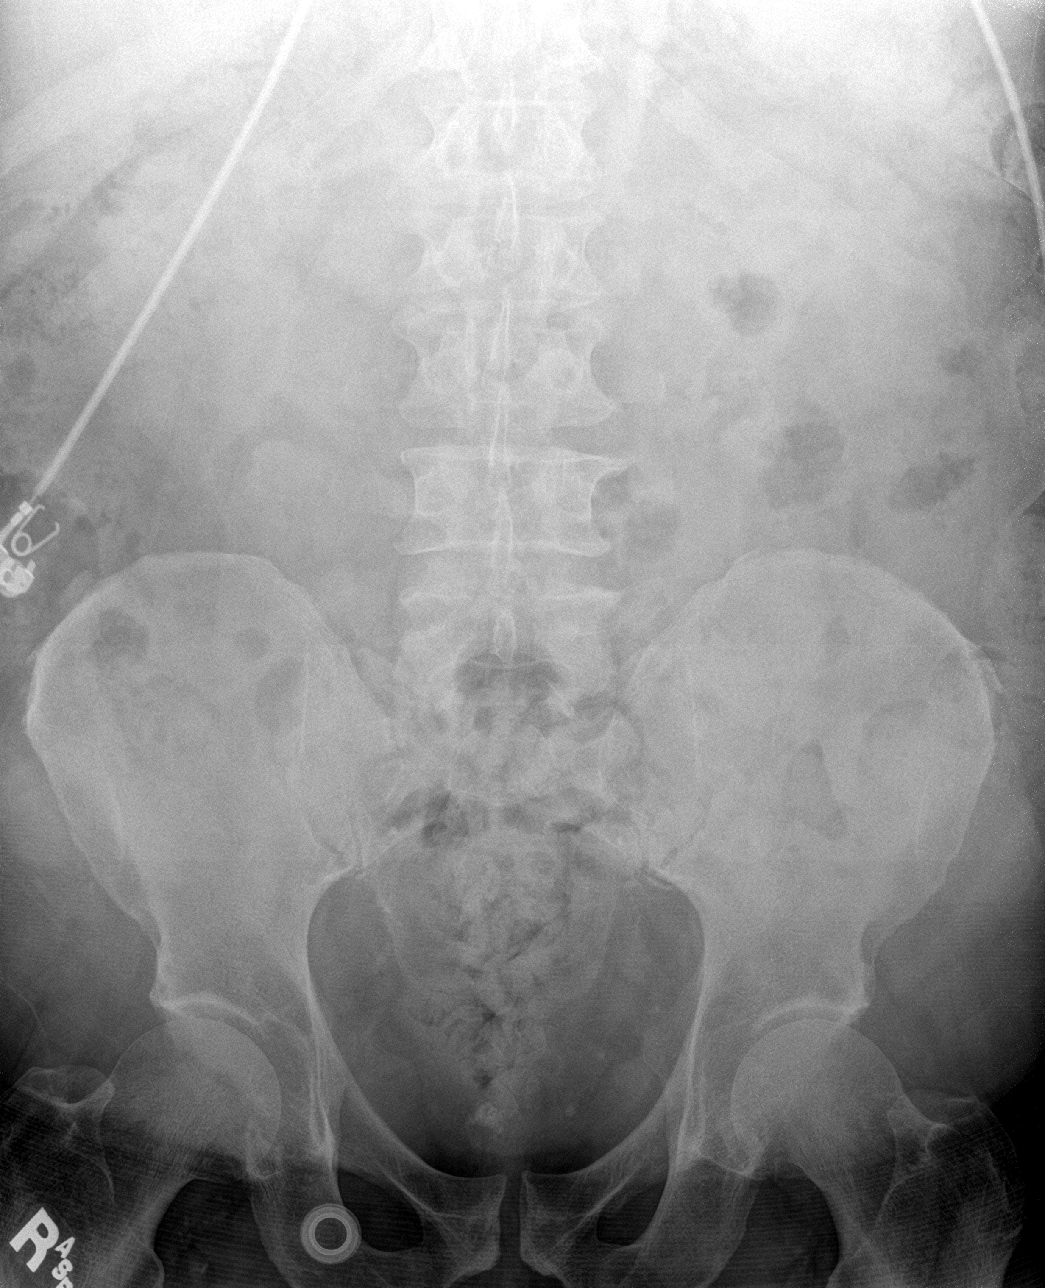

[abdomen erect]
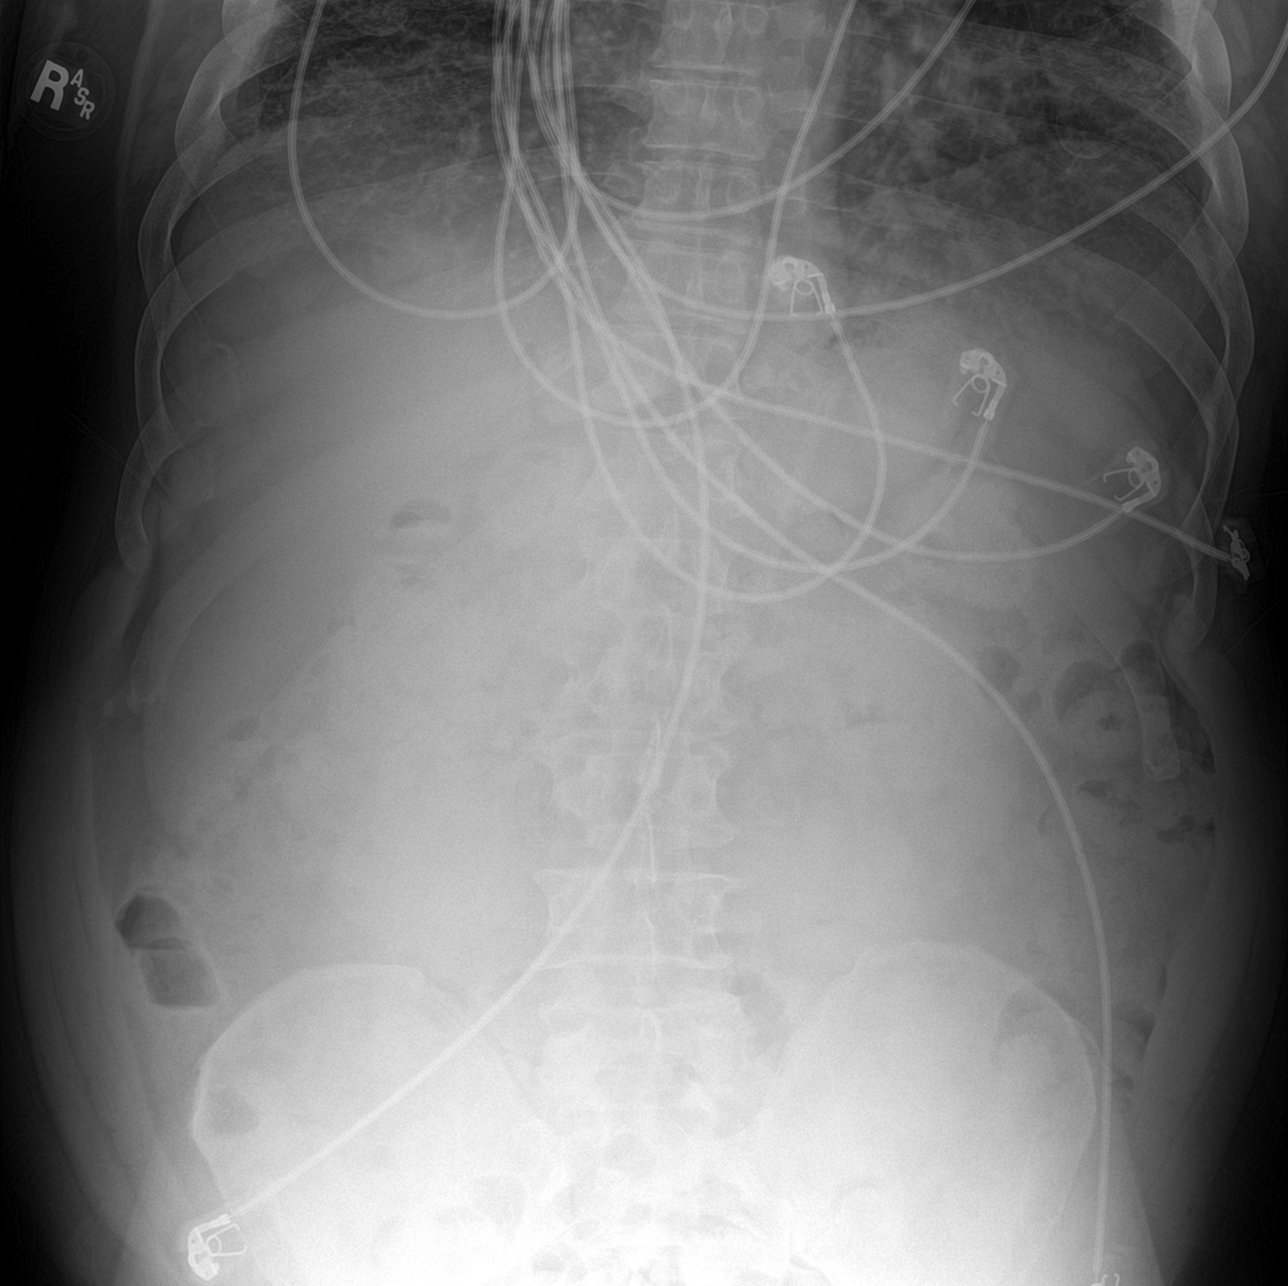

[3 of 3 positions shown; findings below may reference images not displayed]

FINDINGS: There is no evidence of dilated bowel loops or free intraperitoneal
air. Moderate volume of stool throughout the colon. No radiopaque
calculi or other significant radiographic abnormality is seen. Heart
size and mediastinal contours are within normal limits. Chronic
coarsened interstitial markings without superimposed airspace
opacity. No pleural effusion or pneumothorax.
IMPRESSION: 1. Nonobstructive bowel gas pattern. Moderate volume of stool
throughout the colon.
2. No acute cardiopulmonary findings.
3. Chronic coarsened interstitial markings without superimposed
airspace opacity.

## 2021-10-26 IMAGING — CR DG LUMBAR SPINE COMPLETE 4+V
4 series · 4 of 4 positions shown · non-contrast
Comparison: None.

CLINICAL DATA: Constipation, back pain

EXAM:
LUMBAR SPINE - COMPLETE 4+ VIEW

[l-spine ap]
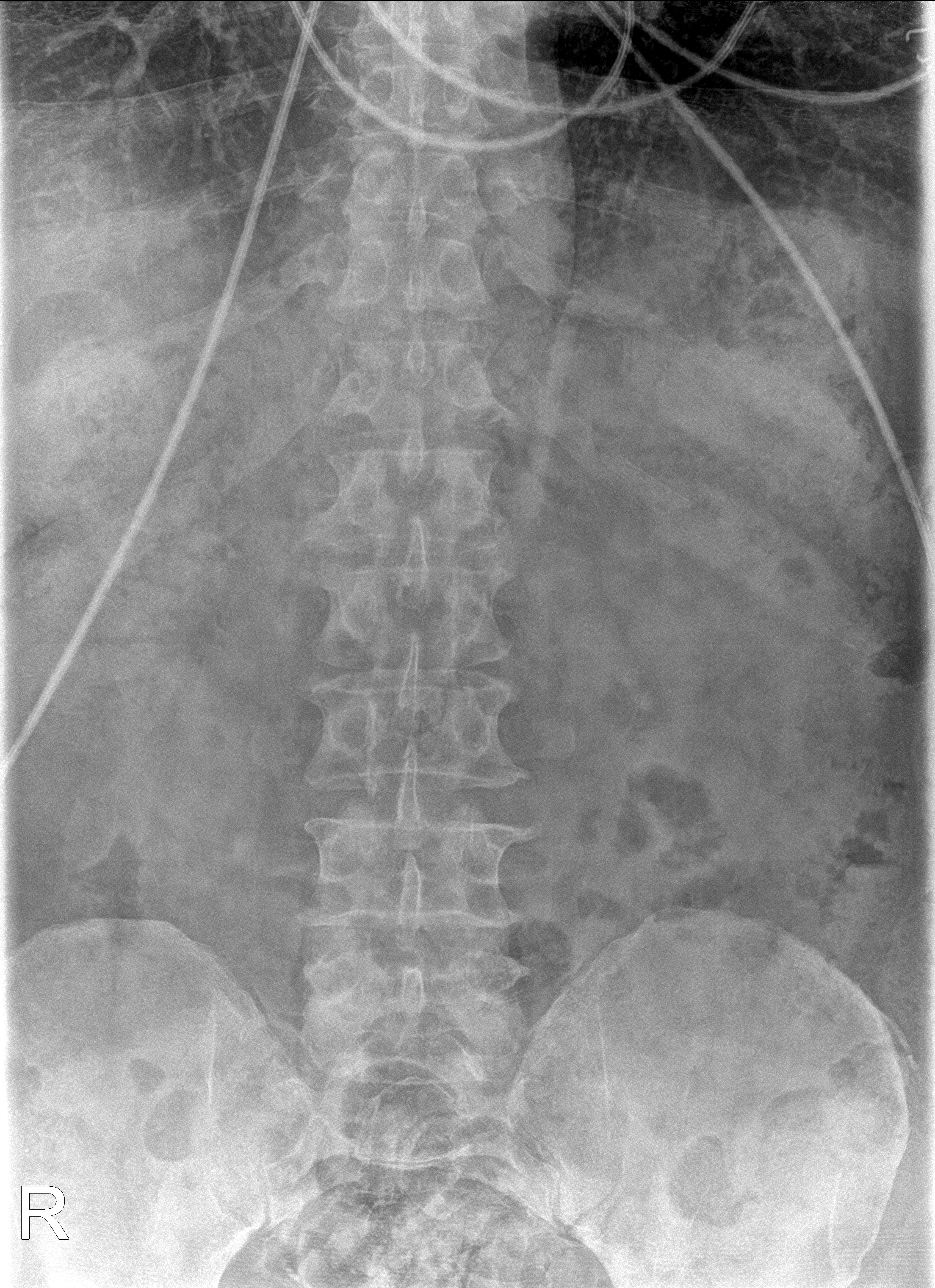

[l-spine obl (1 of 2)]
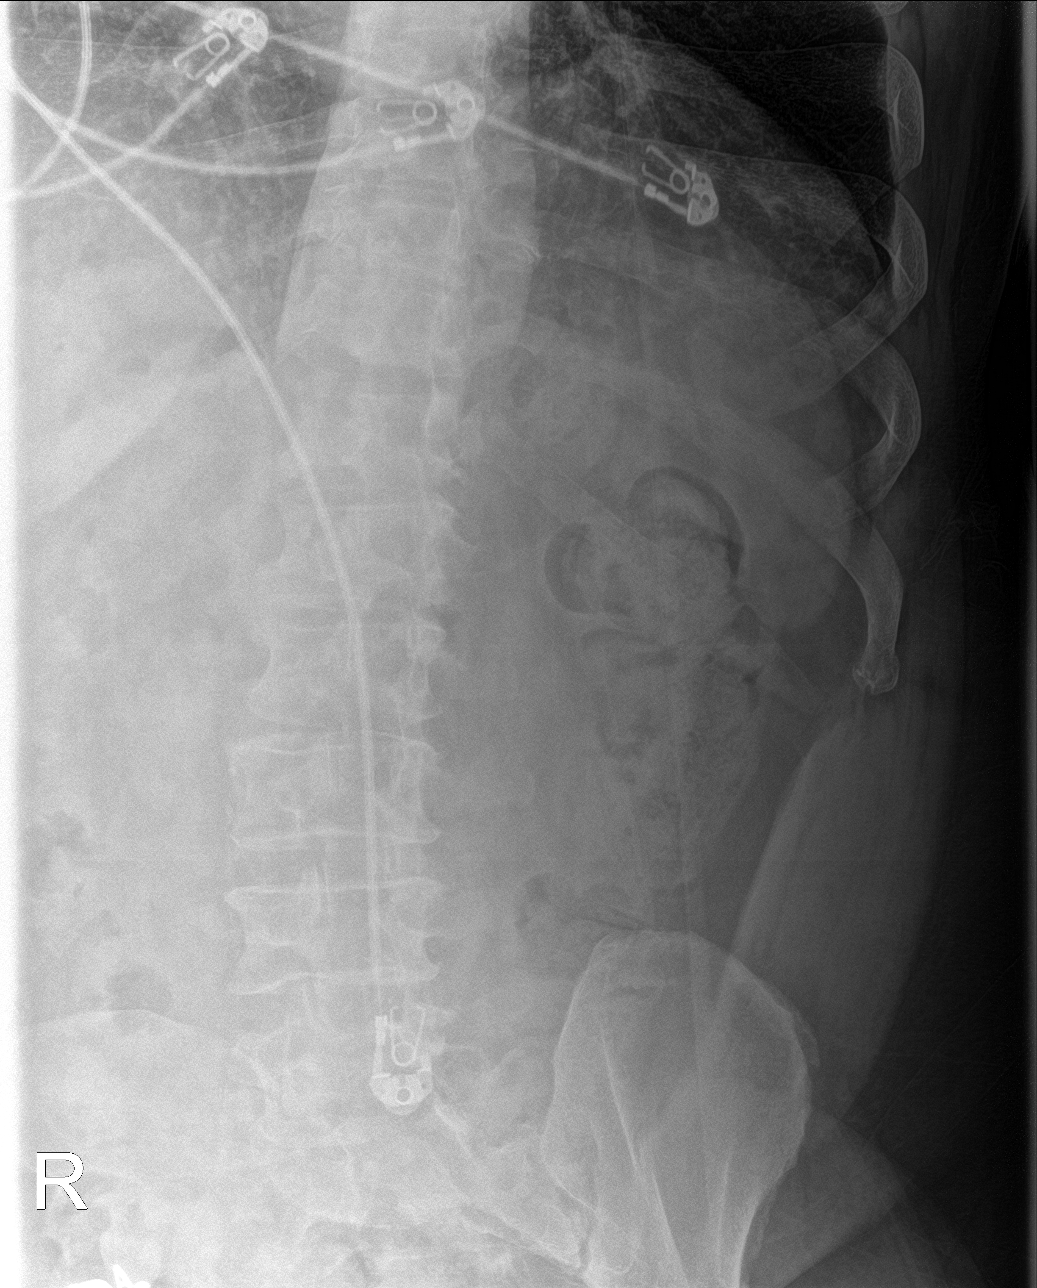

[l-spine obl (2 of 2)]
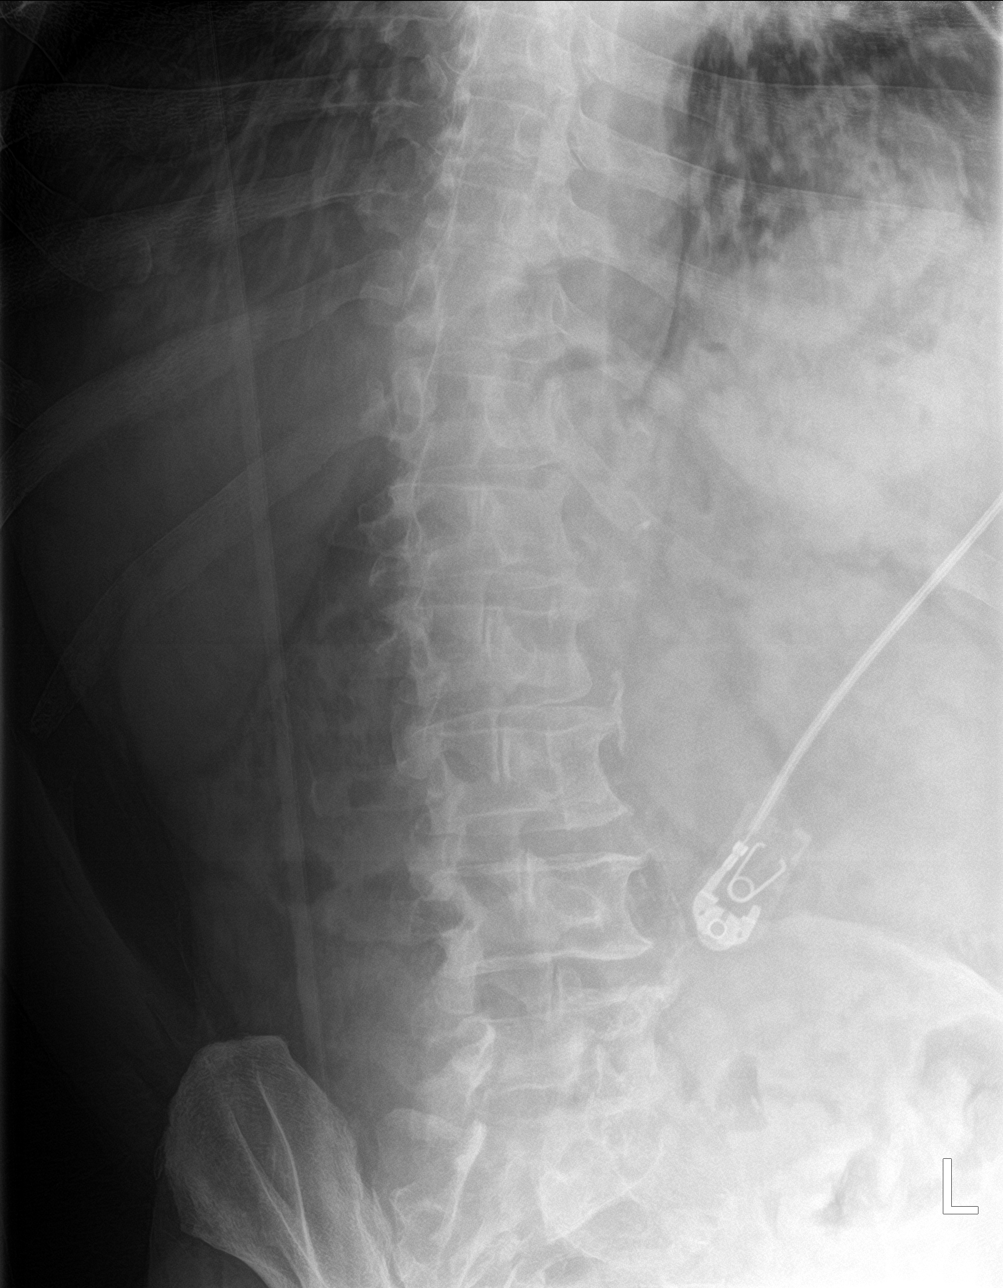

[l-spine lat]
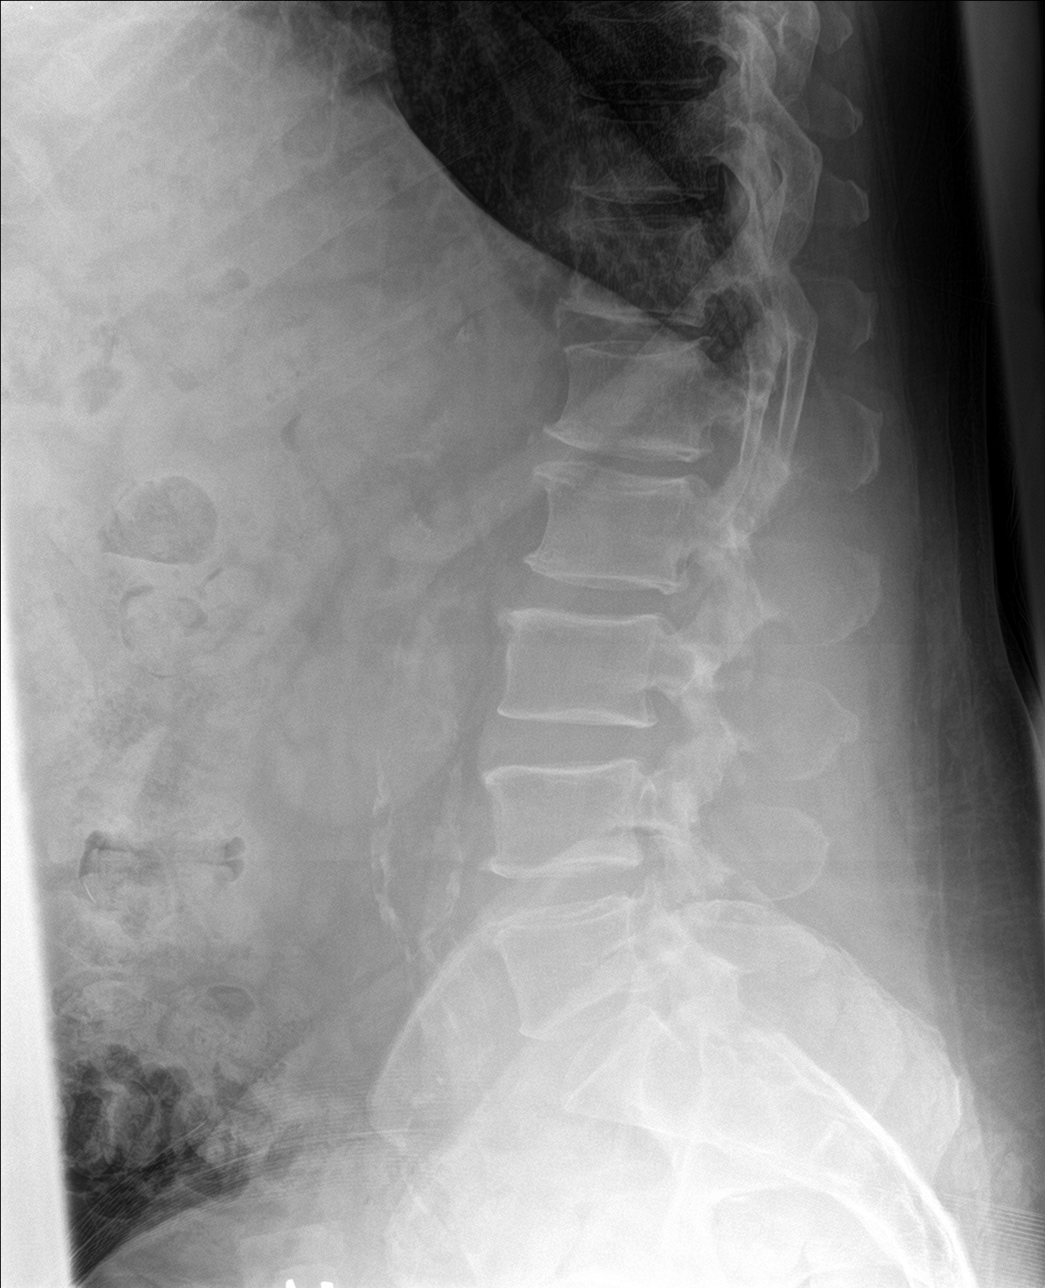

[4 of 4 positions shown; findings below may reference images not displayed]

FINDINGS: There is no evidence of lumbar spine fracture. Alignment is normal.
Mild degenerative changes are seen in the spine. The visible bowel
gas pattern appears normal.
IMPRESSION: No acute findings. Mild degenerative changes.
